# Patient Record
Sex: Female | Born: 1940 | Race: Black or African American | Hispanic: No | Marital: Married | Smoking: Never smoker
Health system: Southern US, Community
[De-identification: ages and names within clinical notes are randomized; demographics above are authoritative.]

## PROBLEM LIST (undated history)

## (undated) DIAGNOSIS — I10 Essential (primary) hypertension: Secondary | ICD-10-CM

## (undated) DIAGNOSIS — R413 Other amnesia: Secondary | ICD-10-CM

## (undated) HISTORY — PX: HERNIA REPAIR: SHX51

## (undated) HISTORY — PX: COLONOSCOPY: SHX174

---

## 2004-09-21 ENCOUNTER — Ambulatory Visit: Payer: Self-pay

## 2004-10-12 ENCOUNTER — Ambulatory Visit: Payer: Self-pay

## 2005-02-16 ENCOUNTER — Ambulatory Visit: Payer: Self-pay

## 2005-02-22 ENCOUNTER — Ambulatory Visit: Payer: Self-pay

## 2005-05-04 ENCOUNTER — Ambulatory Visit: Payer: Self-pay | Admitting: Obstetrics and Gynecology

## 2005-05-11 ENCOUNTER — Inpatient Hospital Stay: Payer: Self-pay | Admitting: Obstetrics and Gynecology

## 2005-10-17 ENCOUNTER — Ambulatory Visit: Payer: Self-pay | Admitting: General Surgery

## 2005-11-06 ENCOUNTER — Emergency Department: Payer: Self-pay | Admitting: Emergency Medicine

## 2006-05-01 ENCOUNTER — Ambulatory Visit: Payer: Self-pay | Admitting: Internal Medicine

## 2006-07-26 ENCOUNTER — Ambulatory Visit: Payer: Self-pay | Admitting: Family Medicine

## 2006-12-15 ENCOUNTER — Emergency Department: Payer: Self-pay | Admitting: Emergency Medicine

## 2007-07-11 ENCOUNTER — Ambulatory Visit: Payer: Self-pay | Admitting: Internal Medicine

## 2007-08-14 ENCOUNTER — Ambulatory Visit: Payer: Self-pay | Admitting: Internal Medicine

## 2007-11-04 ENCOUNTER — Ambulatory Visit: Payer: Self-pay | Admitting: Internal Medicine

## 2008-04-08 ENCOUNTER — Ambulatory Visit: Payer: Self-pay

## 2008-06-22 ENCOUNTER — Ambulatory Visit: Payer: Self-pay | Admitting: Internal Medicine

## 2008-11-02 ENCOUNTER — Ambulatory Visit: Payer: Self-pay | Admitting: Unknown Physician Specialty

## 2008-11-25 ENCOUNTER — Ambulatory Visit: Payer: Self-pay | Admitting: Internal Medicine

## 2008-12-23 ENCOUNTER — Ambulatory Visit: Payer: Self-pay | Admitting: Internal Medicine

## 2009-12-28 ENCOUNTER — Ambulatory Visit: Payer: Self-pay | Admitting: Obstetrics and Gynecology

## 2009-12-29 ENCOUNTER — Ambulatory Visit: Payer: Self-pay | Admitting: Internal Medicine

## 2009-12-31 ENCOUNTER — Emergency Department: Payer: Self-pay | Admitting: Emergency Medicine

## 2010-11-29 ENCOUNTER — Emergency Department: Payer: Self-pay | Admitting: Emergency Medicine

## 2011-01-19 ENCOUNTER — Emergency Department: Payer: Self-pay | Admitting: Unknown Physician Specialty

## 2011-02-10 IMAGING — CT CT ANGIOGRAPHY NECK
1 of 4 series · 12 of 33 positions shown · IV contrast (APPLIED)
Comparison: none

REASON FOR EXAM: lt sided carotid stenosis
COMMENTS:

PROCEDURE:     CT  - CT ANGIOGRAPHY NECK W/WO  - November 25, 2008  [DATE]
RESULT:     Comparisons: No comparison
INDICATION: Left carotid stenosis
TECHNIQUE: 100 ml of Isovue 370 were administered and the extracranial
carotid arteries bilaterally were scanned during the arterial phase from the
level of the superior portion of the frontal sinuses to the proximal neck.
These images were then transferred to the Siemens work station and were
subsequently reviewed utilizing 3-D reconstructions and MIP images.

[Series 4: soft tissue · axial · 0.39mm/px · z∈[+678,+896]mm · 12 of 87 slices shown]
[im 7/87  soft-tissue]
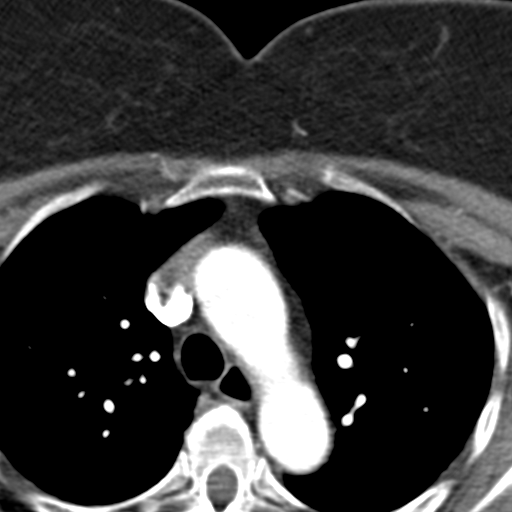
[im 14/87  bone]
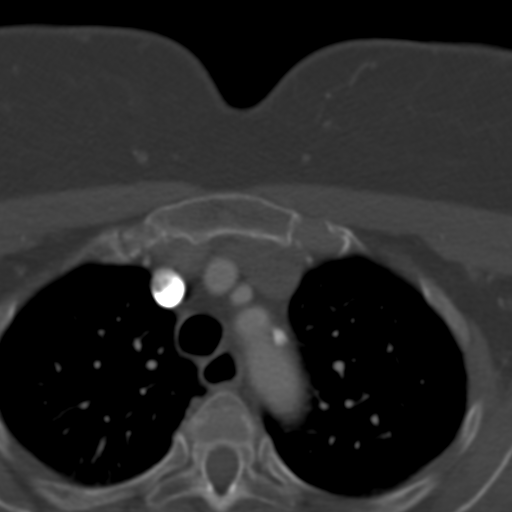
[im 20/87  soft-tissue]
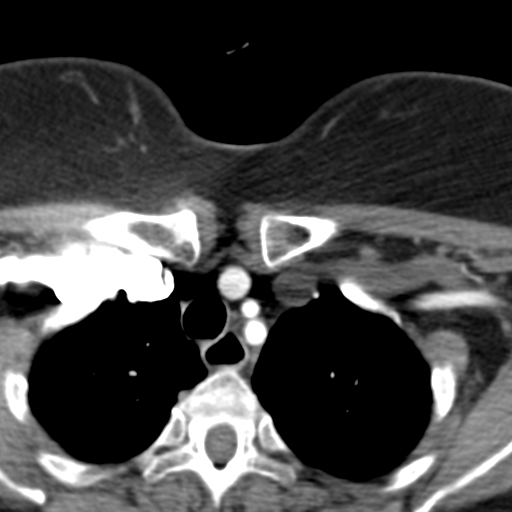
[im 27/87  bone]
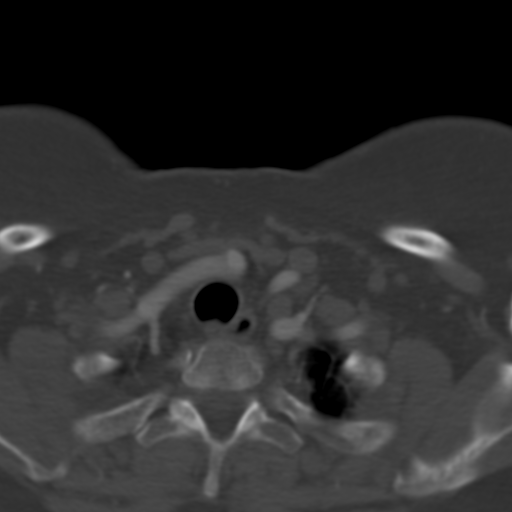
[im 34/87  soft-tissue]
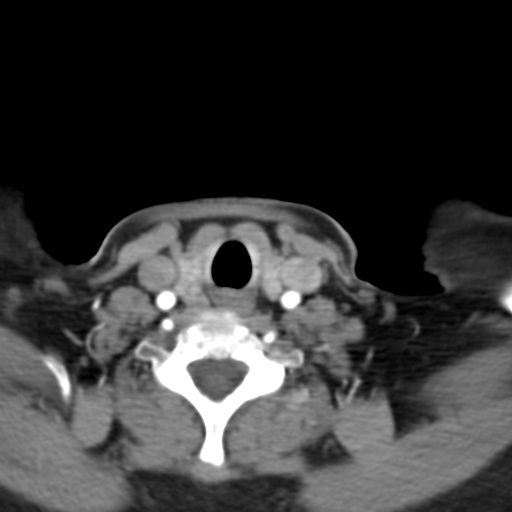
[im 40/87  bone]
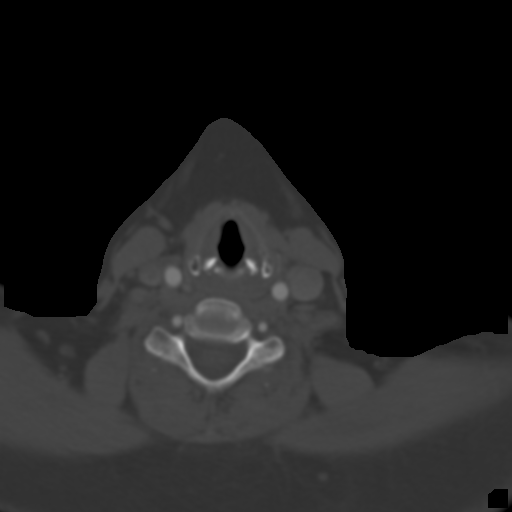
[im 47/87  soft-tissue]
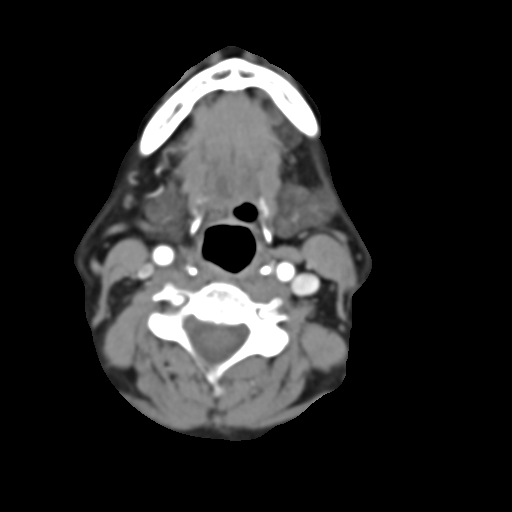
[im 53/87  bone]
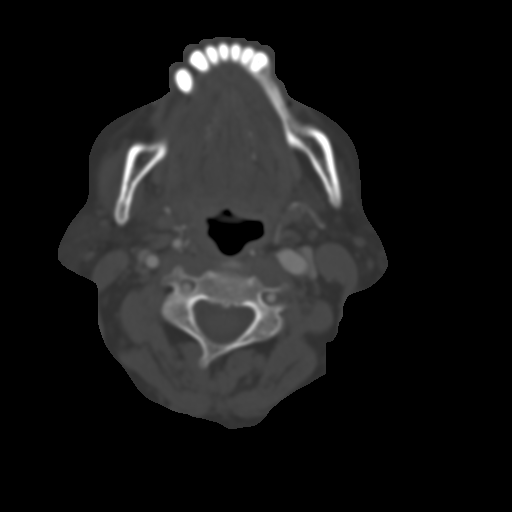
[im 60/87  soft-tissue]
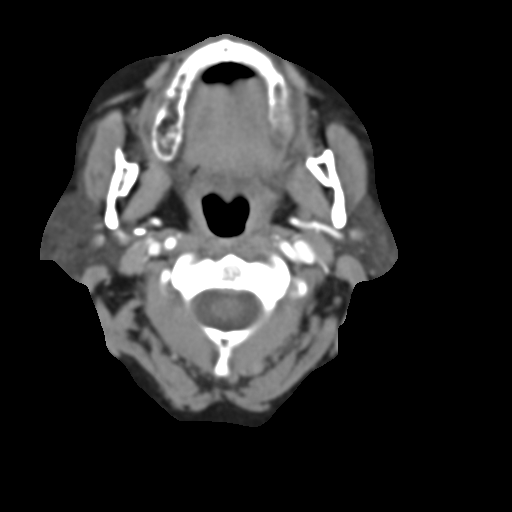
[im 67/87  bone]
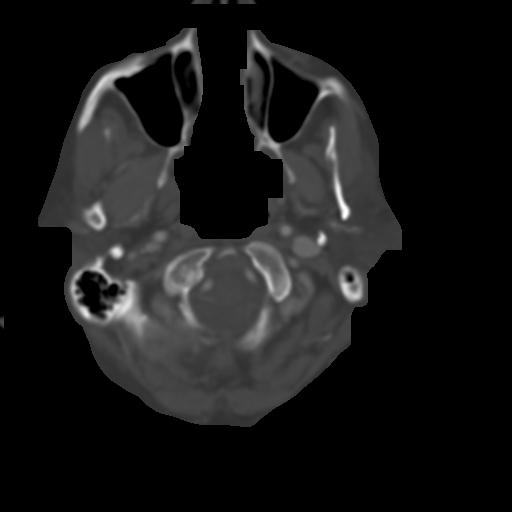
[im 73/87  soft-tissue]
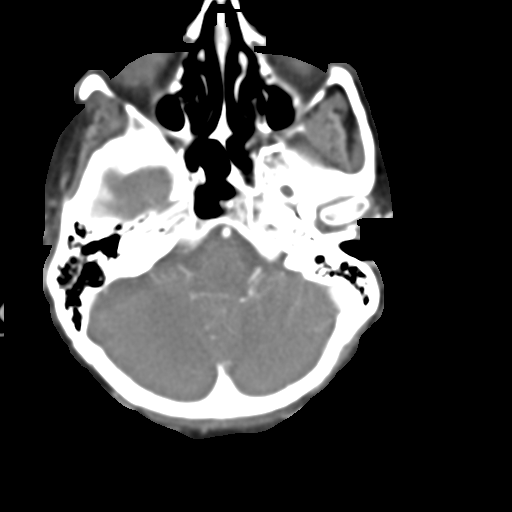
[im 80/87  bone]
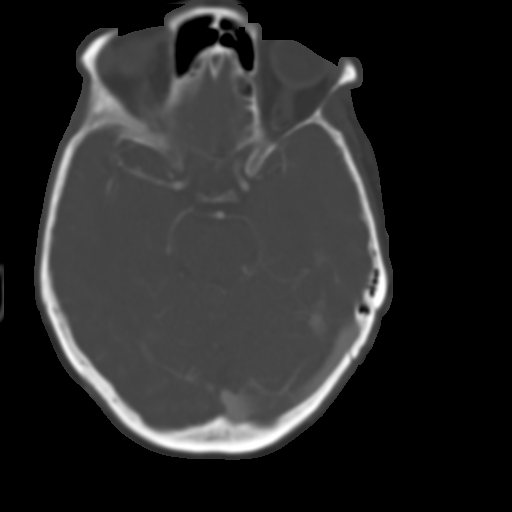

[12 of 33 positions shown; findings below may reference images not displayed]

FINDINGS: A three-vessel configuration of the aortic arch is identified with normal
ostium. The vertebral arteries are identified arising from the subclavian
arteries and entering the transverse foramen bilaterally at C6.

The carotid arteries are identified and are symmetric and unremarkable.
There is no evidence of aneurysm or carotid dissection bilaterally.

On the right, there is no significant atherosclerotic plaque involving the
right common carotid artery, right carotid bulb, or right internal carotid
artery. The right external carotid artery is patent.

On the left, there is no significant atherosclerotic plaque involving the
left common carotid artery, left carotid bulb, or left internal carotid
artery. The left external carotid artery is patent.

The visualized portions of the brain are unremarkable.
IMPRESSION: 1. No evidence of carotid artery stenosis.

## 2011-10-26 ENCOUNTER — Ambulatory Visit: Payer: Self-pay | Admitting: Internal Medicine

## 2012-11-04 ENCOUNTER — Ambulatory Visit: Payer: Self-pay | Admitting: Internal Medicine

## 2014-01-11 ENCOUNTER — Ambulatory Visit: Payer: Self-pay | Admitting: Gastroenterology

## 2014-01-13 ENCOUNTER — Ambulatory Visit: Payer: Self-pay | Admitting: Internal Medicine

## 2014-02-23 ENCOUNTER — Ambulatory Visit: Payer: Self-pay | Admitting: Urology

## 2016-01-13 ENCOUNTER — Emergency Department
Admission: EM | Admit: 2016-01-13 | Discharge: 2016-01-13 | Disposition: A | Discharge status: Home / Self Care | Payer: Medicare HMO | Attending: Student | Admitting: Student

## 2016-01-13 ENCOUNTER — Emergency Department: Payer: Medicare HMO

## 2016-01-13 DIAGNOSIS — R1011 Right upper quadrant pain: Secondary | ICD-10-CM | POA: Diagnosis not present

## 2016-01-13 DIAGNOSIS — R109 Unspecified abdominal pain: Secondary | ICD-10-CM

## 2016-01-13 DIAGNOSIS — M79602 Pain in left arm: Secondary | ICD-10-CM

## 2016-01-13 DIAGNOSIS — R10A1 Flank pain, right side: Secondary | ICD-10-CM

## 2016-01-13 DIAGNOSIS — N189 Chronic kidney disease, unspecified: Secondary | ICD-10-CM | POA: Diagnosis not present

## 2016-01-13 DIAGNOSIS — R52 Pain, unspecified: Secondary | ICD-10-CM

## 2016-01-13 DIAGNOSIS — I129 Hypertensive chronic kidney disease with stage 1 through stage 4 chronic kidney disease, or unspecified chronic kidney disease: Secondary | ICD-10-CM | POA: Insufficient documentation

## 2016-01-13 LAB — URINALYSIS COMPLETE WITH MICROSCOPIC (ARMC ONLY)
BACTERIA UA: NONE SEEN
Bilirubin Urine: NEGATIVE
GLUCOSE, UA: NEGATIVE mg/dL
Ketones, ur: NEGATIVE mg/dL
Leukocytes, UA: NEGATIVE
Nitrite: NEGATIVE
PROTEIN: NEGATIVE mg/dL
Specific Gravity, Urine: 1.011 (ref 1.005–1.030)
WBC, UA: NONE SEEN WBC/hpf (ref 0–5)
pH: 6 (ref 5.0–8.0)

## 2016-01-13 LAB — CBC WITH DIFFERENTIAL/PLATELET
BASOS ABS: 0 10*3/uL (ref 0–0.1)
BASOS PCT: 1 %
EOS ABS: 0.2 10*3/uL (ref 0–0.7)
EOS PCT: 3 %
HCT: 40.8 % (ref 35.0–47.0)
Hemoglobin: 13.6 g/dL (ref 12.0–16.0)
Lymphocytes Relative: 21 %
Lymphs Abs: 1.4 10*3/uL (ref 1.0–3.6)
MCH: 29.4 pg (ref 26.0–34.0)
MCHC: 33.5 g/dL (ref 32.0–36.0)
MCV: 87.7 fL (ref 80.0–100.0)
Monocytes Absolute: 0.4 10*3/uL (ref 0.2–0.9)
Monocytes Relative: 6 %
Neutro Abs: 5 10*3/uL (ref 1.4–6.5)
Neutrophils Relative %: 69 %
PLATELETS: 233 10*3/uL (ref 150–440)
RBC: 4.65 MIL/uL (ref 3.80–5.20)
RDW: 14.7 % — ABNORMAL HIGH (ref 11.5–14.5)
WBC: 7.1 10*3/uL (ref 3.6–11.0)

## 2016-01-13 LAB — COMPREHENSIVE METABOLIC PANEL
ALBUMIN: 4.1 g/dL (ref 3.5–5.0)
ALT: 78 U/L — AB (ref 14–54)
AST: 178 U/L — AB (ref 15–41)
Alkaline Phosphatase: 160 U/L — ABNORMAL HIGH (ref 38–126)
Anion gap: 11 (ref 5–15)
BUN: 13 mg/dL (ref 6–20)
CHLORIDE: 108 mmol/L (ref 101–111)
CO2: 23 mmol/L (ref 22–32)
CREATININE: 0.93 mg/dL (ref 0.44–1.00)
Calcium: 9 mg/dL (ref 8.9–10.3)
GFR calc non Af Amer: 58 mL/min — ABNORMAL LOW (ref >60)
Glucose, Bld: 129 mg/dL — ABNORMAL HIGH (ref 65–99)
Potassium: 3.9 mmol/L (ref 3.5–5.1)
SODIUM: 142 mmol/L (ref 135–145)
Total Bilirubin: 1.7 mg/dL — ABNORMAL HIGH (ref 0.3–1.2)
Total Protein: 6.9 g/dL (ref 6.5–8.1)

## 2016-01-13 LAB — TROPONIN I: Troponin I: <0.03 ng/mL (ref <0.03)

## 2016-01-13 MED ORDER — ACETAMINOPHEN 500 MG PO TABS
1000.0000 mg | ORAL_TABLET | Freq: Once | ORAL | Status: AC
  Administered 2016-01-13: 1000 mg via ORAL
  Filled 2016-01-13: qty 2

## 2016-01-13 MED ORDER — IBUPROFEN 400 MG PO TABS
400.0000 mg | ORAL_TABLET | Freq: Once | ORAL | Status: AC
  Administered 2016-01-13: 400 mg via ORAL
  Filled 2016-01-13: qty 1

## 2016-01-13 MED ORDER — IBUPROFEN 800 MG PO TABS: 400.0000 mg | ORAL_TABLET | Freq: Four times a day (QID) | ORAL | 0 refills | Status: DC | PRN

## 2016-01-13 NOTE — ED Provider Notes (Signed)
[facility] Emergency Department Provider Note   ____________________________________________   First MD Initiated Contact with Patient 01/13/16 1114     (approximate)  I have reviewed the triage vital signs and the nursing notes.   HISTORY  Chief Complaint Flank Pain Left arm/shoulder pain   HPI Angel Cameron is a 75 y.o. female with history of hypertension, hyponatremia, chronic kidney disease who presents primarily for evaluation of 3 weeks of intermittent left shoulder/arm pain, gradual onset, worse with movement, atraumatic, currently mild to moderate. Additionally, over the past 3 or 4 days she has had some mild right flank pain, no dysuria or hematuria, no abdominal pain, no vomiting or diarrhea, no chest pain or difficulty breathing. No fevers or chills. She denies chest pain or difficulty breathing. No cough or fever. She denies any numbness or weakness in the legs, no bowel or bladder incontinence.   No past medical history on file.  There are no active problems to display for this patient.   No past surgical history on file.  Prior to Admission medications   Medication Sig Start Date End Date Taking? Authorizing Provider  ibuprofen (ADVIL,MOTRIN) 800 MG tablet Take 0.5 tablets (400 mg total) by mouth every 6 (six) hours as needed for moderate pain. 01/13/16   Gayla Doss, MD    Allergies Review of patient's allergies indicates no known allergies.  No family history on file.  Social History Social History  Substance Use Topics  . Smoking status: Not on file  . Smokeless tobacco: Not on file  . Alcohol use Not on file    Review of Systems Constitutional: No fever/chills Eyes: No visual changes. ENT: No sore throat. Cardiovascular: Denies chest pain. Respiratory: Denies shortness of breath. Gastrointestinal: No abdominal pain.  No nausea, no vomiting.  No diarrhea.  No constipation. Genitourinary: Negative for  dysuria. Musculoskeletal:Positive for right flank pain. Skin: Negative for rash. Neurological: Negative for headaches, focal weakness or numbness.  10-point ROS otherwise negative.  ____________________________________________   PHYSICAL EXAM:  Vitals:   01/13/16 1330 01/13/16 1400 01/13/16 1441 01/13/16 1450  BP: 140/73 (!) 154/72 (!) 149/81   Pulse: 94 94 (!) 101 99  Resp: Temp:   98.3 F (36.8 C)   TempSrc:   Oral   SpO2: 97% 96% 96% 98%  Weight:      Height:        VITAL SIGNS: ED Triage Vitals  Enc Vitals Group     BP 01/13/16 0944 (!) 179/69     Pulse Rate 01/13/16 0944 87     Resp 01/13/16 0944 14     Temp 01/13/16 0944 98.2 F (36.8 C)     Temp Source 01/13/16 0944 Oral     SpO2 01/13/16 0944 95 %     Weight 01/13/16 0944 170 lb (77.1 kg)     Height 01/13/16 0944  (1.575 m)     Head Circumference --      Peak Flow --      Pain Score 01/13/16 0945 7     Pain Loc --      Pain Edu? --      Excl. in GC? --     Constitutional: Alert and oriented. Well appearing and in no acute distress. Eyes: Conjunctivae are normal. PERRL. EOMI. Head: Atraumatic. Nose: No congestion/rhinnorhea. Mouth/Throat: Mucous membranes are moist.  Oropharynx non-erythematous. Neck: No stridor.   Cardiovascular: Normal rate, regular rhythm. Grossly normal heart  sounds.  Good peripheral circulation. Respiratory: Normal respiratory effort.  No retractions. Lungs CTAB. Gastrointestinal: Soft and nontender. No distention. No CVA tenderness. Genitourinary: deferred Musculoskeletal: No lower extremity tenderness nor edema.  No joint effusions. Full range of motion at the left shoulder, no bony step-off or deformity. The patient does have some mild tenderness to palpation in the triceps and the biceps just adjacent to the left shoulder joint. Palpation seems to reproduce her pain. 2+ left radial pulse, left radial, median and ulnar nerve are intact. Neurologic:  Normal speech  and language. No gross focal neurologic deficits are appreciated. No gait instability. Skin:  Skin is warm, dry and intact. No rash noted. Psychiatric: Mood and affect are normal. Speech and behavior are normal.  ____________________________________________   LABS (all labs ordered are listed, but only abnormal results are displayed)  Labs Reviewed  URINALYSIS COMPLETEWITH MICROSCOPIC ([facility] ONLY) - Abnormal; Notable for the following:       Result Value   Color, Urine YELLOW (*)    APPearance CLEAR (*)    Hgb urine dipstick 1+ (*)    Squamous Epithelial / LPF 0-5 (*)    All other components within normal limits  CBC WITH DIFFERENTIAL/PLATELET - Abnormal; Notable for the following:    RDW 14.7 (*)    All other components within normal limits  COMPREHENSIVE METABOLIC PANEL - Abnormal; Notable for the following:    Glucose, Bld 129 (*)    AST 178 (*)    ALT 78 (*)    Alkaline Phosphatase 160 (*)    Total Bilirubin 1.7 (*)    GFR calc non Af Amer 58 (*)    All other components within normal limits  TROPONIN I   ____________________________________________  EKG  ED ECG REPORT I, Gayla Doss, the attending physician, personally viewed and interpreted this ECG.   Date: 01/13/2016  EKG Time: 09:48  Rate: 89  Rhythm: normal sinus rhythm  Axis: normal  Intervals:none  ST&T Change: No acute ST elevation or acute ST depression.  ____________________________________________  RADIOLOGY  CXR IMPRESSION: Aortic atherosclerosis. No edema or consolidation.  RUQ ultrasound IMPRESSION: No evidence of acute abnormality. Unremarkable gallbladder. 1.6 cm hyperechoic left hepatic region, likely focal fatty infiltration or possibly hemangioma.  [place] retroperitoneal IMPRESSION: No renal abnormalities. No abdominal aortic aneurysm. In the proximal aorta, the maximum transverse diameter is 2.5 x 2.8 cm. Ectatic abdominal aorta at risk for aneurysm development.  Recommend followup by ultrasound in 5 years. This recommendation follows ACR consensus guidelines: White Paper of the ACR Incidental Findings Committee II on Vascular Findings. J Am Coll Radiol 2013; 10:789-794. Study otherwise unremarkable. No renal abnormalities. No abdominal aortic aneurysm. In the proximal aorta, the maximum transverse diameter is 2.5 x 2.8 cm. Ectatic abdominal aorta at risk for aneurysm development. Recommend followup by ultrasound in 5 years. This recommendation follows ACR consensus guidelines: White Paper of the ACR Incidental Findings Committee II on Vascular Findings. J Am Coll Radiol 2013; 10:789-794. Study otherwise unremarkable. ____________________________________________   PROCEDURES  Procedure(s) performed: None  Procedures  Critical Care performed: No  ____________________________________________   INITIAL IMPRESSION / ASSESSMENT AND PLAN / ED COURSE  Pertinent labs & imaging results that were available during my care of the patient were reviewed by me and considered in my medical decision making (see chart for details).  Angel Cameron is a 75 y.o. female with history of hypertension, hyponatremia, chronic kidney disease who presents primarily for evaluation of 3 weeks of intermittent  left shoulder/arm pain, gradual onset, worse with movement, atraumatic. On exam, she is very well-appearing and in no acute distress. Her vital signs are stable and she is afebrile. She has no obvious deformity associated with the left arm, no swelling, she is neurovascularly intact and she has reproducible pain when I press on her proximal muscles and I suspect that this is muscular skeletal pain however given her age and risk factors we'll obtain screening cardiac labs, check a chest x-ray. Her right flank pain is mild and nonspecific, we'll obtain screening abdominal labs, urinalysis and likely retroperitoneal ultrasound to evaluate the aortic contours and to  evaluate for possible obstructing stone. We'll treat her pain and reassess for disposition.  ----------------------------------------- 3:05 PM on 01/13/2016 ----------------------------------------- Patient reports significant improvement of her symptoms. She is sitting up in bed and appears comfortable. I reviewed her labs. CBC generally unremarkable. CMP showed mild nonspecific LFTs elevation as well as T bili elevation. Troponin negative. Urinalysis is not consistent with infection. Chest x-ray shows no acute cardio Mary disease. Ultrasound of the retroperitoneum is generally unremarkable and right upper quadrant ultrasound shows normal gallbladder, no obstructing stone. Suspect musculoskeletal pain of the left arm/shoulder for which I have recommended Motrin. I discussed with her that the exact cause of her mild elevation of her LFTs is not clear to me and that she is to follow with her primary care doctor quickly for reevaluation. She voices understanding of this. We also discussed that she may need a repeat ultrasound of the aorta in 5 years. We discussed return precautions, need for close PCP follow-up and she is comfortable with the discharge plan. DC home.  Clinical Course     ____________________________________________   FINAL CLINICAL IMPRESSION(S) / ED DIAGNOSES  Final diagnoses:  Right flank pain  Arm pain, musculoskeletal, left  Pain      NEW MEDICATIONS STARTED DURING THIS VISIT:  Discharge Medication List as of 01/13/2016  3:04 PM    START taking these medications   Details  ibuprofen (ADVIL,MOTRIN) 800 MG tablet Take 0.5 tablets (400 mg total) by mouth every 6 (six) hours as needed for moderate pain., Starting Fri 01/13/2016, Print         Note:  This document was prepared using Dragon voice recognition software and may include unintentional dictation errors.    Gayla Doss, MD 01/13/16 [phone]

## 2016-01-13 NOTE — ED Notes (Signed)
Patient transported to X-ray 

## 2016-01-13 NOTE — Discharge Instructions (Signed)
You were seen in the emergency department for left arm pain which is likely related to a strain. You are also having some right flank pain, the cause of which is unclear. Your liver function tests were mildly elevated and the cause of this isn't exactly clear. You need to follow up with your primary care doctor in the next 2-3 days so that they can recheck these levels. Return immediately to the emergency department if you develop severe or worsening flank/side pain, any chest pain or difficulty breathing, any abdominal pain, vomiting, fevers, or chills. Your aorta today looks to be within normal limits however you will probably need a repeat ultrasound of the aorta in 5 years or sooner depending on what your primary care doctor recommends.

## 2016-01-13 NOTE — ED Triage Notes (Signed)
Pt states she started having rt side pain yesterday and cough and congestion, pt states pain is nagging and not going away, pt denies hx of pneumonia, kidney stone, and denies pain with urination

## 2017-09-09 ENCOUNTER — Emergency Department
Admission: EM | Admit: 2017-09-09 | Discharge: 2017-09-09 | Disposition: A | Discharge status: Home / Self Care | Payer: Medicare HMO | Attending: Emergency Medicine | Admitting: Emergency Medicine

## 2017-09-09 ENCOUNTER — Other Ambulatory Visit: Payer: Self-pay

## 2017-09-09 ENCOUNTER — Emergency Department: Payer: Medicare HMO

## 2017-09-09 ENCOUNTER — Encounter: Payer: Self-pay | Admitting: Emergency Medicine

## 2017-09-09 DIAGNOSIS — N189 Chronic kidney disease, unspecified: Secondary | ICD-10-CM | POA: Insufficient documentation

## 2017-09-09 DIAGNOSIS — R42 Dizziness and giddiness: Secondary | ICD-10-CM | POA: Insufficient documentation

## 2017-09-09 DIAGNOSIS — Z79899 Other long term (current) drug therapy: Secondary | ICD-10-CM | POA: Insufficient documentation

## 2017-09-09 DIAGNOSIS — I129 Hypertensive chronic kidney disease with stage 1 through stage 4 chronic kidney disease, or unspecified chronic kidney disease: Secondary | ICD-10-CM | POA: Diagnosis not present

## 2017-09-09 DIAGNOSIS — E860 Dehydration: Secondary | ICD-10-CM

## 2017-09-09 LAB — URINALYSIS, COMPLETE (UACMP) WITH MICROSCOPIC
BACTERIA UA: NONE SEEN
Bilirubin Urine: NEGATIVE
Glucose, UA: NEGATIVE mg/dL
Hgb urine dipstick: NEGATIVE
KETONES UR: NEGATIVE mg/dL
Leukocytes, UA: NEGATIVE
Nitrite: NEGATIVE
PROTEIN: NEGATIVE mg/dL
RBC / HPF: NONE SEEN RBC/hpf (ref 0–5)
Specific Gravity, Urine: 1.015 (ref 1.005–1.030)
pH: 5 (ref 5.0–8.0)

## 2017-09-09 LAB — BASIC METABOLIC PANEL
Anion gap: 8 (ref 5–15)
BUN: 37 mg/dL — AB (ref 6–20)
CHLORIDE: 105 mmol/L (ref 101–111)
CO2: 27 mmol/L (ref 22–32)
CREATININE: 1.29 mg/dL — AB (ref 0.44–1.00)
Calcium: 9 mg/dL (ref 8.9–10.3)
GFR calc Af Amer: 45 mL/min — ABNORMAL LOW (ref >60)
GFR calc non Af Amer: 39 mL/min — ABNORMAL LOW (ref >60)
GLUCOSE: 119 mg/dL — AB (ref 65–99)
POTASSIUM: 4.6 mmol/L (ref 3.5–5.1)
Sodium: 140 mmol/L (ref 135–145)

## 2017-09-09 LAB — CBC WITH DIFFERENTIAL/PLATELET
Basophils Absolute: 0 10*3/uL (ref 0–0.1)
Basophils Relative: 1 %
EOS PCT: 4 %
Eosinophils Absolute: 0.2 10*3/uL (ref 0–0.7)
HCT: 39.8 % (ref 35.0–47.0)
HEMOGLOBIN: 13.1 g/dL (ref 12.0–16.0)
LYMPHS ABS: 1.3 10*3/uL (ref 1.0–3.6)
LYMPHS PCT: 29 %
MCH: 28.9 pg (ref 26.0–34.0)
MCHC: 32.8 g/dL (ref 32.0–36.0)
MCV: 88.2 fL (ref 80.0–100.0)
MONOS PCT: 9 %
Monocytes Absolute: 0.4 10*3/uL (ref 0.2–0.9)
Neutro Abs: 2.5 10*3/uL (ref 1.4–6.5)
Neutrophils Relative %: 57 %
Platelets: 221 10*3/uL (ref 150–440)
RBC: 4.51 MIL/uL (ref 3.80–5.20)
RDW: 14 % (ref 11.5–14.5)
WBC: 4.4 10*3/uL (ref 3.6–11.0)

## 2017-09-09 LAB — TROPONIN I

## 2017-09-09 MED ORDER — MECLIZINE HCL 25 MG PO TABS
25.0000 mg | ORAL_TABLET | Freq: Once | ORAL | Status: AC
  Administered 2017-09-09: 25 mg via ORAL
  Filled 2017-09-09: qty 1

## 2017-09-09 MED ORDER — SODIUM CHLORIDE 0.9 % IV BOLUS
1000.0000 mL | Freq: Once | INTRAVENOUS | Status: AC
  Administered 2017-09-09: 1000 mL via INTRAVENOUS

## 2017-09-09 NOTE — ED Triage Notes (Addendum)
Pt to ed with c/o dizziness that started last night worse this am.   Pt denies chest pain, denies headache, denies weakness. Pt with good equal grips bilat.  No facial drooping noted, no slurred speech or difficulty speaking noted.

## 2017-09-09 NOTE — ED Notes (Signed)
Ambulates to BR in room without dizziness.

## 2017-09-09 NOTE — ED Provider Notes (Signed)
[facility] Emergency Department Provider Note  ____________________________________________  Time seen: Approximately 11:56 AM  I have reviewed the triage vital signs and the nursing notes.   HISTORY  Chief Complaint Dizziness   HPI Angel Cameron is a 77 y.o. female with a history of vertigo, CKD, HTN, HLD who presents for evaluation of dizziness.  Patient reports that started yesterday.  She reports that she feels lightheaded but also reports feeling off balance. This morning needed assistance to walk as she was very dizzy per daughter. Her symptoms are worse when she stands up, resolve when she sits down.  She reports similar symptoms in the past when she was diagnosed with vertigo.  She does not take any medications for it.  No personal or family history of stroke, no history of smoking.  No headache, facial droop, slurred speech, unilateral weakness or numbness.  No vomiting or diarrhea, no URI symptoms, no chest pain or shortness of breath, no fever, no dysuria or hematuria.   PMH HTN HLD Vertigo CKD  Prior to Admission medications   Medication Sig Start Date End Date Taking? Authorizing Provider  atorvastatin (LIPITOR) 10 MG tablet Take 10 mg by mouth daily.   Yes [provider]  benazepril (LOTENSIN) 40 MG tablet Take 40 mg by mouth 2 (two) times daily.   Yes [provider]  donepezil (ARICEPT) 5 MG tablet Take 5 mg by mouth at bedtime.   Yes [provider]  ibuprofen (ADVIL,MOTRIN) 800 MG tablet Take 0.5 tablets (400 mg total) by mouth every 6 (six) hours as needed for moderate pain. 01/13/16  Yes Gayla Doss, MD  levothyroxine (SYNTHROID, LEVOTHROID) 75 MCG tablet Take 75 mcg by mouth daily before breakfast.   Yes [provider]  memantine (NAMENDA) 5 MG tablet Take 5 mg by mouth daily.   Yes [provider]  minocycline (DYNACIN) 100 MG tablet Take 100 mg by mouth 2 (two) times daily. 09/02/17  09/12/17 Yes [provider]  omeprazole (PRILOSEC) 20 MG capsule Take 20 mg by mouth daily.   Yes [provider]    Allergies Patient has no known allergies.  FH Heart failure Father    Dementia Mother    Diabetes Mother    Hypertension Mother       Social History Social History   Tobacco Use  . Smoking status: Never Smoker  . Smokeless tobacco: Never Used  Substance Use Topics  . Alcohol use: Not Currently    Frequency: Never  . Drug use: Never    Review of Systems  Constitutional: Negative for fever. Eyes: Negative for visual changes. ENT: Negative for sore throat. Neck: No neck pain  Cardiovascular: Negative for chest pain. Respiratory: Negative for shortness of breath. Gastrointestinal: Negative for abdominal pain, vomiting or diarrhea. Genitourinary: Negative for dysuria. Musculoskeletal: Negative for back pain. Skin: Negative for rash. Neurological: Negative for headaches, weakness or numbness. + dizziness Psych: No SI or HI  ____________________________________________   PHYSICAL EXAM:  VITAL SIGNS: ED Triage Vitals [09/09/17 0837]  Enc Vitals Group     BP (!) 158/75     Pulse Rate 67     Resp 18     Temp 97.8 F (36.6 C)     Temp Source Oral     SpO2 98 %     Weight 170 lb (77.1 kg)     Height      Head Circumference      Peak Flow  Pain Score 0     Pain Loc      Pain Edu?      Excl. in GC?     Constitutional: Alert and oriented. Well appearing and in no apparent distress. HEENT:      Head: Normocephalic and atraumatic.         Eyes: Conjunctivae are normal. Sclera is non-icteric.       Mouth/Throat: Mucous membranes are moist.       Neck: Supple with no signs of meningismus. Cardiovascular: Regular rate and rhythm. No murmurs, gallops, or rubs. 2+ symmetrical distal pulses are present in all extremities. No JVD. Respiratory: Normal respiratory effort. Lungs are clear to auscultation bilaterally. No  wheezes, crackles, or rhonchi.  Gastrointestinal: Soft, non tender, and non distended with positive bowel sounds. No rebound or guarding. Genitourinary: No CVA tenderness. Musculoskeletal: Nontender with normal range of motion in all extremities. No edema, cyanosis, or erythema of extremities. Neurologic: Normal speech and language. A & O x3, PERRL, EOMI, no nystagmus, CN II-XII intact, motor testing reveals good tone and bulk throughout. There is no evidence of pronator drift or dysmetria. Muscle strength is 5/5 throughout. Deep tendon reflexes are 2+ throughout with downgoing toes. Sensory examination is intact. Gait deferred as patient felt extremely dizzy with standing. Skin: Skin is warm, dry and intact. No rash noted. Psychiatric: Mood and affect are normal. Speech and behavior are normal.  ____________________________________________   LABS (all labs ordered are listed, but only abnormal results are displayed)  Labs Reviewed  BASIC METABOLIC PANEL - Abnormal; Notable for the following components:      Result Value   Glucose, Bld 119 (*)    BUN 37 (*)    Creatinine, Ser 1.29 (*)    GFR calc non Af Amer 39 (*)    GFR calc Af Amer 45 (*)    All other components within normal limits  URINALYSIS, COMPLETE (UACMP) WITH MICROSCOPIC - Abnormal; Notable for the following components:   Color, Urine YELLOW (*)    APPearance CLEAR (*)    Squamous Epithelial / LPF 0-5 (*)    All other components within normal limits  CBC WITH DIFFERENTIAL/PLATELET  TROPONIN I   ____________________________________________  EKG  ED ECG REPORT I, Nita Sickle, the attending physician, personally viewed and interpreted this ECG.  Normal sinus rhythm, rate of 76, normal intervals, normal axis, no ST elevations or depressions. ________________________________  RADIOLOGY  I have personally reviewed the images performed during this visit and I agree with the Radiologist's read.   Interpretation  by Radiologist:  Ct Head Wo Contrast  Result Date: 09/09/2017 CLINICAL DATA:  Dizziness. EXAM: CT HEAD WITHOUT CONTRAST TECHNIQUE: Contiguous axial images were obtained from the base of the skull through the vertex without intravenous contrast. COMPARISON:  CT scan of December 31, 2009. FINDINGS: Brain: No evidence of acute infarction, hemorrhage, hydrocephalus, extra-axial collection or mass lesion/mass effect. Vascular: No hyperdense vessel or unexpected calcification. Skull: Normal. Negative for fracture or focal lesion. Sinuses/Orbits: No acute finding. Other: Fluid is noted in left mastoid air cells. IMPRESSION: No significant intracranial abnormality seen. Electronically Signed   By: Lupita Raider, M.D.   On: 09/09/2017 09:27     ____________________________________________   PROCEDURES  Procedure(s) performed: None Procedures Critical Care performed:  None ____________________________________________   INITIAL IMPRESSION / ASSESSMENT AND PLAN / ED COURSE  77 y.o. female with a history of vertigo, CKD, HTN, HLD who presents for evaluation of dizziness.  Patient found  to be severely orthostatic with dizziness and inability to walk with standing.  Orthostatic vital signs positive.  Labs showing mildly elevated creatinine consistent with dehydration.  Patient received 2 L of normal saline with resolution of her orthostatic vital signs and resolution of her dizziness.  She is tolerating p.o.  She remains neurologically intact.  Her gait is normal.  UA negative for UTI, CBC with no evidence of anemia.  Troponin and EKG with no evidence of ischemia.  Head CT is within normal limits.  Patient is going to be discharged home with increase oral hydration and follow-up with primary care doctor for repeat labs.  Discussed return precautions for signs of central vertigo, stroke, or severe dehydration.  Patient will be discharged to the care of her family members were at the bedside.      As part of my  medical decision making, I reviewed the following data within the electronic MEDICAL RECORD NUMBER Nursing notes reviewed and incorporated, Labs reviewed , EKG interpreted , Radiograph reviewed , Notes from prior ED visits and [place] Controlled Substance Database    Pertinent labs & imaging results that were available during my care of the patient were reviewed by me and considered in my medical decision making (see chart for details).    ____________________________________________   FINAL CLINICAL IMPRESSION(S) / ED DIAGNOSES  Final diagnoses:  Orthostatic dizziness  Dehydration      NEW MEDICATIONS STARTED DURING THIS VISIT:  ED Discharge Orders    None       Note:  This document was prepared using Dragon voice recognition software and may include unintentional dictation errors.    Don PerkingVeronese, [place], MD 09/09/17 [id number]

## 2019-01-10 ENCOUNTER — Other Ambulatory Visit: Payer: Self-pay

## 2019-01-10 ENCOUNTER — Emergency Department: Payer: Medicare HMO

## 2019-01-10 ENCOUNTER — Encounter: Payer: Self-pay | Admitting: Emergency Medicine

## 2019-01-10 ENCOUNTER — Emergency Department
Admission: EM | Admit: 2019-01-10 | Discharge: 2019-01-10 | Disposition: A | Discharge status: Home / Self Care | Payer: Medicare HMO | Attending: Student in an Organized Health Care Education/Training Program | Admitting: Student in an Organized Health Care Education/Training Program

## 2019-01-10 DIAGNOSIS — I129 Hypertensive chronic kidney disease with stage 1 through stage 4 chronic kidney disease, or unspecified chronic kidney disease: Secondary | ICD-10-CM | POA: Diagnosis not present

## 2019-01-10 DIAGNOSIS — Z79899 Other long term (current) drug therapy: Secondary | ICD-10-CM | POA: Diagnosis not present

## 2019-01-10 DIAGNOSIS — R42 Dizziness and giddiness: Secondary | ICD-10-CM | POA: Diagnosis not present

## 2019-01-10 DIAGNOSIS — N182 Chronic kidney disease, stage 2 (mild): Secondary | ICD-10-CM | POA: Diagnosis not present

## 2019-01-10 DIAGNOSIS — E860 Dehydration: Secondary | ICD-10-CM | POA: Diagnosis not present

## 2019-01-10 DIAGNOSIS — R809 Proteinuria, unspecified: Secondary | ICD-10-CM | POA: Insufficient documentation

## 2019-01-10 HISTORY — DX: Other amnesia: R41.3

## 2019-01-10 HISTORY — DX: Essential (primary) hypertension: I10

## 2019-01-10 LAB — CBC WITH DIFFERENTIAL/PLATELET
Abs Immature Granulocytes: 0.02 10*3/uL (ref 0.00–0.07)
Basophils Absolute: 0 10*3/uL (ref 0.0–0.1)
Basophils Relative: 1 %
Eosinophils Absolute: 0.2 10*3/uL (ref 0.0–0.5)
Eosinophils Relative: 3 %
HCT: 37.4 % (ref 36.0–46.0)
Hemoglobin: 12.2 g/dL (ref 12.0–15.0)
Immature Granulocytes: 0 %
Lymphocytes Relative: 27 %
Lymphs Abs: 1.5 10*3/uL (ref 0.7–4.0)
MCH: 28.8 pg (ref 26.0–34.0)
MCHC: 32.6 g/dL (ref 30.0–36.0)
MCV: 88.2 fL (ref 80.0–100.0)
Monocytes Absolute: 0.5 10*3/uL (ref 0.1–1.0)
Monocytes Relative: 9 %
Neutro Abs: 3.3 10*3/uL (ref 1.7–7.7)
Neutrophils Relative %: 60 %
Platelets: 196 10*3/uL (ref 150–400)
RBC: 4.24 MIL/uL (ref 3.87–5.11)
RDW: 13 % (ref 11.5–15.5)
WBC: 5.5 10*3/uL (ref 4.0–10.5)
nRBC: 0 % (ref 0.0–0.2)

## 2019-01-10 LAB — COMPREHENSIVE METABOLIC PANEL
ALT: 14 U/L (ref 0–44)
AST: 18 U/L (ref 15–41)
Albumin: 3.7 g/dL (ref 3.5–5.0)
Alkaline Phosphatase: 100 U/L (ref 38–126)
Anion gap: 5 (ref 5–15)
BUN: 15 mg/dL (ref 8–23)
CO2: 30 mmol/L (ref 22–32)
Calcium: 8.8 mg/dL — ABNORMAL LOW (ref 8.9–10.3)
Chloride: 105 mmol/L (ref 98–111)
Creatinine, Ser: 0.96 mg/dL (ref 0.44–1.00)
GFR calc Af Amer: >60 mL/min (ref >60)
GFR calc non Af Amer: 56 mL/min — ABNORMAL LOW (ref >60)
Glucose, Bld: 140 mg/dL — ABNORMAL HIGH (ref 70–99)
Potassium: 4 mmol/L (ref 3.5–5.1)
Sodium: 140 mmol/L (ref 135–145)
Total Bilirubin: 0.8 mg/dL (ref 0.3–1.2)
Total Protein: 6.4 g/dL — ABNORMAL LOW (ref 6.5–8.1)

## 2019-01-10 LAB — URINALYSIS, COMPLETE (UACMP) WITH MICROSCOPIC
Bacteria, UA: NONE SEEN
Bilirubin Urine: NEGATIVE
Glucose, UA: NEGATIVE mg/dL
Hgb urine dipstick: NEGATIVE
Ketones, ur: NEGATIVE mg/dL
Leukocytes,Ua: NEGATIVE
Nitrite: NEGATIVE
Protein, ur: NEGATIVE mg/dL
Specific Gravity, Urine: 1.015 (ref 1.005–1.030)
pH: 5 (ref 5.0–8.0)

## 2019-01-10 MED ORDER — SODIUM CHLORIDE 0.9 % IV BOLUS
500.0000 mL | Freq: Once | INTRAVENOUS | Status: AC
  Administered 2019-01-10: 10:00:00 500 mL via INTRAVENOUS

## 2019-01-10 MED ORDER — SODIUM CHLORIDE 0.9 % IV BOLUS
500.0000 mL | Freq: Once | INTRAVENOUS | Status: AC
  Administered 2019-01-10: 09:00:00 500 mL via INTRAVENOUS

## 2019-01-10 NOTE — ED Notes (Signed)
Pt up to use bathroom at this time. 

## 2019-01-10 NOTE — ED Notes (Signed)
Pt with daughter at bedside states that around 9 last night she started feeling lightheaded- pt states that this has happened before and that it was dehydration

## 2019-01-10 NOTE — ED Notes (Signed)
X-ray at bedside

## 2019-01-10 NOTE — ED Triage Notes (Addendum)
Pt arrived via POV with daughter, pt has hx of memory loss. Pt states she "got to feeling bad".  Pt reports feeling dizzy last night, pt has hx of CKD, goes to nephrology.  Daughter states she does not drink water like she is supposed to.

## 2019-01-10 NOTE — ED Provider Notes (Signed)
[facility] Emergency Department Provider Note    First MD Initiated Contact with Patient 01/10/19 [phone]     (approximate)  I have reviewed the triage vital signs and the nursing notes.   HISTORY  Chief Complaint Dizziness    HPI Latanya MaudlinJattie M Codispoti is a 78 y.o. female with a history of memory loss as well as CKD frequently becoming dehydrated presents the ER for evaluation of "dizziness and lightheadedness "since around 9:00 last night.  States that she just feels lightheaded particular when she stands up.  She denies any chest pain or pressure.  No nausea or vomiting.  No numbness or tingling.  This has happened several times in the past.  She presents with her daughter who states that she does not drink enough fluids and has had to be given IV fluids for similar symptoms.    Past Medical History:  Diagnosis Date  . Hypertension   . Memory loss    No family history on file. No past surgical history on file. Patient Active Problem List   Diagnosis Date Noted  . Chronic kidney disease (CKD) stage G2/A1, mildly decreased glomerular filtration rate (GFR) between 60-89 mL/min/1.73 square meter and albuminuria creatinine ratio less than 30 mg/g 01/10/2019  . Proteinuria 01/10/2019      Prior to Admission medications   Medication Sig Start Date End Date Taking? Authorizing Provider  atorvastatin (LIPITOR) 10 MG tablet Take 10 mg by mouth daily.    [provider]  benazepril (LOTENSIN) 40 MG tablet Take 40 mg by mouth 2 (two) times daily.    [provider]  donepezil (ARICEPT) 5 MG tablet Take 5 mg by mouth at bedtime.    [provider]  ibuprofen (ADVIL,MOTRIN) 800 MG tablet Take 0.5 tablets (400 mg total) by mouth every 6 (six) hours as needed for moderate pain. 01/13/16   Gayla DossGayle, Eryka A, MD  levothyroxine (SYNTHROID, LEVOTHROID) 75 MCG tablet Take 75 mcg by mouth daily before breakfast.    [provider]  memantine  (NAMENDA) 5 MG tablet Take 5 mg by mouth daily.    [provider]  omeprazole (PRILOSEC) 20 MG capsule Take 20 mg by mouth daily.    [provider]    Allergies Patient has no known allergies.    Social History Social History   Tobacco Use  . Smoking status: Never Smoker  . Smokeless tobacco: Never Used  Substance Use Topics  . Alcohol use: Not Currently    Frequency: Never  . Drug use: Never    Review of Systems Patient denies headaches, rhinorrhea, blurry vision, numbness, shortness of breath, chest pain, edema, cough, abdominal pain, nausea, vomiting, diarrhea, dysuria, fevers, rashes or hallucinations unless otherwise stated above in HPI. ____________________________________________   PHYSICAL EXAM:  VITAL SIGNS: Vitals:   01/10/19 0824  BP: (!) 162/82  Pulse: 71  Resp: 18  Temp: 98 F (36.7 C)  SpO2: 97%    Constitutional: Alert and oriented.  Eyes: Conjunctivae are normal.  Head: Atraumatic. Nose: No congestion/rhinnorhea. Mouth/Throat: Mucous membranes are moist.   Neck: No stridor. Painless ROM.  Cardiovascular: Normal rate, regular rhythm. Grossly normal heart sounds.  Good peripheral circulation. Respiratory: Normal respiratory effort.  No retractions. Lungs CTAB. Gastrointestinal: Soft and nontender. No distention. No abdominal bruits. No CVA tenderness. Genitourinary:  Musculoskeletal: No lower extremity tenderness nor edema.  No joint effusions. Neurologic:  Normal speech and language. No gross focal neurologic deficits are appreciated. No facial droop  Skin:  Skin is warm, dry and intact. No rash noted. Psychiatric: Mood and affect are normal. Speech and behavior are normal.  ____________________________________________   LABS (all labs ordered are listed, but only abnormal results are displayed)  No results found for this or any previous visit (from the past 24 hour(s)). ____________________________________________  EKG  My review and personal interpretation at Time: 8:37   Indication: dizziness  Rate: 80  Rhythm: sinus Axis: normal  Other: nonspecific st abn, no stemi ____________________________________________  RADIOLOGY  I personally reviewed all radiographic images ordered to evaluate for the above acute complaints and reviewed radiology reports and findings.  These findings were personally discussed with the patient.  Please see medical record for radiology report.  ____________________________________________   PROCEDURES  Procedure(s) performed:  Procedures    Critical Care performed: no ____________________________________________   INITIAL IMPRESSION / ASSESSMENT AND PLAN / ED COURSE  Pertinent labs & imaging results that were available during my care of the patient were reviewed by me and considered in my medical decision making (see chart for details).   DDX: dehydration, electrolyte abn, uti, dysrhythmia, vertigo, cva  CYANN VENTI is a 78 y.o. who presents to the ED with lightheadedness and dizziness as described above.  She is well-appearing with no focal neuro deficits.  States that she is had similar episodes related to dehydration.  Daughter with her states that she only drinks sodas and does not drink water frequently gets dehydrated.  Blood will be sent for above differential.  Does not seem clinically consistent with infectious process, acs, CVA or central nervous system pathology.  The patient will be placed on continuous pulse oximetry and telemetry for monitoring.  Laboratory evaluation will be sent to evaluate for the above complaints.     Clinical Course as of Jan 09 1042  Sat Jan 10, 2019  0939 Patient reassessed.  Feeling improved after IV fluids.  Blood work is reassuring.  No evidence of infectious process.  Repeat neuro exam is nonfocal.   [PR]    Clinical Course User Index [PR] Merlyn Lot, MD    The patient was evaluated in Emergency Department  today for the symptoms described in the history of present illness. He/she was evaluated in the context of the global COVID-19 pandemic, which necessitated consideration that the patient might be at risk for infection with the SARS-CoV-2 virus that causes COVID-19. Institutional protocols and algorithms that pertain to the evaluation of patients at risk for COVID-19 are in a state of rapid change based on information released by regulatory bodies including the CDC and federal and state organizations. These policies and algorithms were followed during the patient's care in the ED.  As part of my medical decision making, I reviewed the following data within the [place] notes reviewed and incorporated, Labs reviewed, notes from prior ED visits and [place] Controlled Substance Database   ____________________________________________   FINAL CLINICAL IMPRESSION(S) / ED DIAGNOSES  Final diagnoses:  Dehydration  Dizziness      NEW MEDICATIONS STARTED DURING THIS VISIT:  New Prescriptions   No medications on file     Note:  This document was prepared using Dragon voice recognition software and may include unintentional dictation errors.    Merlyn Lot, MD 01/10/19 1045

## 2019-08-06 ENCOUNTER — Ambulatory Visit: Payer: Medicare HMO

## 2019-08-10 ENCOUNTER — Ambulatory Visit: Payer: Medicare HMO | Attending: Family

## 2019-08-10 DIAGNOSIS — Z23 Encounter for immunization: Secondary | ICD-10-CM | POA: Insufficient documentation

## 2019-08-10 NOTE — Progress Notes (Signed)
   Covid-19 Vaccination Clinic  Name:  Angel Cameron    MRN: [id number] DOB: 10-Aug-1940  08/10/2019  [name] was observed post Covid-19 immunization for 15 minutes without incidence. She was provided with Vaccine Information Sheet and instruction to access the V-Safe system.   [name] was instructed to call 911 with any severe reactions post vaccine: Marland Kitchen Difficulty breathing  . Swelling of your face and throat  . A fast heartbeat  . A bad rash all over your body  . Dizziness and weakness    Immunizations Administered    Name Date Dose VIS Date Route   Moderna COVID-19 Vaccine 08/10/2019  3:49 PM 0.5 mL 05/19/2019 Intramuscular   Manufacturer: Moderna   Lot: [id number]   NDC: 16838-706-58

## 2019-09-02 ENCOUNTER — Other Ambulatory Visit: Payer: Self-pay

## 2019-09-02 ENCOUNTER — Emergency Department: Payer: Medicare HMO

## 2019-09-02 ENCOUNTER — Encounter: Payer: Self-pay | Admitting: Emergency Medicine

## 2019-09-02 ENCOUNTER — Emergency Department
Admission: EM | Admit: 2019-09-02 | Discharge: 2019-09-02 | Disposition: A | Discharge status: Home / Self Care | Payer: Medicare HMO | Attending: Emergency Medicine | Admitting: Emergency Medicine

## 2019-09-02 DIAGNOSIS — E860 Dehydration: Secondary | ICD-10-CM

## 2019-09-02 DIAGNOSIS — I129 Hypertensive chronic kidney disease with stage 1 through stage 4 chronic kidney disease, or unspecified chronic kidney disease: Secondary | ICD-10-CM | POA: Insufficient documentation

## 2019-09-02 DIAGNOSIS — S0990XA Unspecified injury of head, initial encounter: Secondary | ICD-10-CM | POA: Insufficient documentation

## 2019-09-02 DIAGNOSIS — N182 Chronic kidney disease, stage 2 (mild): Secondary | ICD-10-CM | POA: Diagnosis not present

## 2019-09-02 DIAGNOSIS — H81399 Other peripheral vertigo, unspecified ear: Secondary | ICD-10-CM | POA: Insufficient documentation

## 2019-09-02 DIAGNOSIS — W19XXXA Unspecified fall, initial encounter: Secondary | ICD-10-CM | POA: Insufficient documentation

## 2019-09-02 DIAGNOSIS — Y929 Unspecified place or not applicable: Secondary | ICD-10-CM | POA: Diagnosis not present

## 2019-09-02 DIAGNOSIS — R519 Headache, unspecified: Secondary | ICD-10-CM | POA: Diagnosis not present

## 2019-09-02 DIAGNOSIS — Y939 Activity, unspecified: Secondary | ICD-10-CM | POA: Insufficient documentation

## 2019-09-02 DIAGNOSIS — Z79899 Other long term (current) drug therapy: Secondary | ICD-10-CM | POA: Insufficient documentation

## 2019-09-02 DIAGNOSIS — Y999 Unspecified external cause status: Secondary | ICD-10-CM | POA: Insufficient documentation

## 2019-09-02 LAB — URINALYSIS, COMPLETE (UACMP) WITH MICROSCOPIC
Bacteria, UA: NONE SEEN
Bilirubin Urine: NEGATIVE
Glucose, UA: NEGATIVE mg/dL
Hgb urine dipstick: NEGATIVE
Ketones, ur: NEGATIVE mg/dL
Leukocytes,Ua: NEGATIVE
Nitrite: NEGATIVE
Protein, ur: NEGATIVE mg/dL
Specific Gravity, Urine: 1.004 — ABNORMAL LOW (ref 1.005–1.030)
pH: 7 (ref 5.0–8.0)

## 2019-09-02 LAB — BASIC METABOLIC PANEL
Anion gap: 9 (ref 5–15)
BUN: 15 mg/dL (ref 8–23)
CO2: 29 mmol/L (ref 22–32)
Calcium: 8.8 mg/dL — ABNORMAL LOW (ref 8.9–10.3)
Chloride: 101 mmol/L (ref 98–111)
Creatinine, Ser: 0.98 mg/dL (ref 0.44–1.00)
GFR calc Af Amer: >60 mL/min (ref >60)
GFR calc non Af Amer: 54 mL/min — ABNORMAL LOW (ref >60)
Glucose, Bld: 140 mg/dL — ABNORMAL HIGH (ref 70–99)
Potassium: 4.2 mmol/L (ref 3.5–5.1)
Sodium: 139 mmol/L (ref 135–145)

## 2019-09-02 LAB — CBC
HCT: 36.7 % (ref 36.0–46.0)
Hemoglobin: 11.8 g/dL — ABNORMAL LOW (ref 12.0–15.0)
MCH: 28.4 pg (ref 26.0–34.0)
MCHC: 32.2 g/dL (ref 30.0–36.0)
MCV: 88.4 fL (ref 80.0–100.0)
Platelets: 186 10*3/uL (ref 150–400)
RBC: 4.15 MIL/uL (ref 3.87–5.11)
RDW: 13.2 % (ref 11.5–15.5)
WBC: 5.4 10*3/uL (ref 4.0–10.5)
nRBC: 0 % (ref 0.0–0.2)

## 2019-09-02 MED ORDER — MECLIZINE HCL 25 MG PO TABS
25.0000 mg | ORAL_TABLET | Freq: Once | ORAL | Status: AC
  Administered 2019-09-02: 25 mg via ORAL
  Filled 2019-09-02: qty 1

## 2019-09-02 MED ORDER — MECLIZINE HCL 25 MG PO TABS: 12.5000 mg | ORAL_TABLET | Freq: Two times a day (BID) | ORAL | 0 refills | Status: AC | PRN

## 2019-09-02 MED ORDER — SODIUM CHLORIDE 0.9 % IV BOLUS
1000.0000 mL | Freq: Once | INTRAVENOUS | Status: AC
  Administered 2019-09-02: 1000 mL via INTRAVENOUS

## 2019-09-02 NOTE — ED Provider Notes (Signed)
[facility] Emergency Department Provider Note  ____________________________________________  Time seen: Approximately 7:33 PM  I have reviewed the triage vital signs and the nursing notes.   HISTORY  Chief Complaint Dizziness    HPI Angel Cameron is a 79 y.o. female with a history of hypertension, CKD, dementia who comes the ED complaining of dizziness since last night.  Feels like room spinning, worse with turning her head and laying down.  No alleviating factors.  No vomiting hearing changes tinnitus vision changes paresthesias weakness.  She did have a fall and hit the back of her head on the ground 2 weeks ago for which she did not seek medical care.  Denies changes in balance or coordination.  Symptoms today are intermittent.  Denies neck pain but has a mild bilateral frontal headache which is nonradiating.  States she has had similar symptoms in the past which have been related to dehydration.  She notes that she has been eating okay, but does not drink water, only soda for liquids.      Past Medical History:  Diagnosis Date  . Hypertension   . Memory loss      Patient Active Problem List   Diagnosis Date Noted  . Chronic kidney disease (CKD) stage G2/A1, mildly decreased glomerular filtration rate (GFR) between 60-89 mL/min/1.73 square meter and albuminuria creatinine ratio less than 30 mg/g 01/10/2019  . Proteinuria 01/10/2019     History reviewed. No pertinent surgical history.   Prior to Admission medications   Medication Sig Start Date End Date Taking? Authorizing Provider  atorvastatin (LIPITOR) 10 MG tablet Take 10 mg by mouth daily.   Yes [provider]  benazepril (LOTENSIN) 40 MG tablet Take 40 mg by mouth 2 (two) times daily.   Yes [provider]  donepezil (ARICEPT) 5 MG tablet Take 5 mg by mouth at bedtime.   Yes [provider]  levothyroxine (SYNTHROID, LEVOTHROID) 75 MCG tablet Take 75 mcg by  mouth daily before breakfast.   Yes [provider]  memantine (NAMENDA) 5 MG tablet Take 5 mg by mouth daily.   Yes [provider]  Multiple Vitamin (MULTIVITAMIN WITH MINERALS) TABS tablet Take 1 tablet by mouth daily.   Yes [provider]  omeprazole (PRILOSEC) 20 MG capsule Take 20 mg by mouth daily.   Yes [provider]  meclizine (ANTIVERT) 25 MG tablet Take 0.5 tablets (12.5 mg total) by mouth 2 (two) times daily as needed for dizziness. 09/02/19   Sharman Cheek, MD     Allergies Patient has no known allergies.   History reviewed. No pertinent family history.  Social History Social History   Tobacco Use  . Smoking status: Never Smoker  . Smokeless tobacco: Never Used  Substance Use Topics  . Alcohol use: Not Currently  . Drug use: Never    Review of Systems  Constitutional:   No fever or chills.  ENT:   No sore throat. No rhinorrhea. Cardiovascular:   No chest pain or syncope. Respiratory:   No dyspnea or cough. Gastrointestinal:   Negative for abdominal pain, vomiting and diarrhea.  Musculoskeletal:   Negative for focal pain or swelling All other systems reviewed and are negative except as documented above in ROS and HPI.  ____________________________________________   PHYSICAL EXAM:  VITAL SIGNS: ED Triage Vitals [09/02/19 1619]  Enc Vitals Group     BP (!) 187/113     Pulse Rate 86     Resp 18  Temp 98.6 F (37 C)     Temp Source Oral     SpO2 97 %     Weight 159 lb (72.1 kg)     Height 5\' 2"  (1.575 m)     Head Circumference      Peak Flow      Pain Score 0     Pain Loc      Pain Edu?      Excl. in [place]?     Vital signs reviewed, nursing assessments reviewed.   Constitutional:   Alert and oriented. Non-toxic appearance. Eyes:   Conjunctivae are normal. EOMI. PERRL.  No nystagmus ENT      Head:   Normocephalic and atraumatic.      Nose:   Wearing a mask.      Mouth/Throat:   Wearing a mask.       Neck:   No meningismus. Full ROM. Hematological/Lymphatic/Immunilogical:   No cervical lymphadenopathy. Cardiovascular:   RRR. Symmetric bilateral radial and DP pulses.  No murmurs. Cap refill less than 2 seconds. Respiratory:   Normal respiratory effort without tachypnea/retractions. Breath sounds are clear and equal bilaterally. No wheezes/rales/rhonchi. Gastrointestinal:   Soft and nontender. Non distended. There is no CVA tenderness.  No rebound, rigidity, or guarding. Genitourinary:   deferred Musculoskeletal:   Normal range of motion in all extremities. No joint effusions.  No lower extremity tenderness.  No edema. Neurologic:   Normal speech and language.  Cranial nerves III through XII intact Normal finger-to-nose, normal heel-to-shin.  No pronator drift Motor grossly intact. No acute focal neurologic deficits are appreciated.  Skin:    Skin is warm, dry and intact. No rash noted.  No petechiae, purpura, or bullae.  ____________________________________________    LABS (pertinent positives/negatives) (all labs ordered are listed, but only abnormal results are displayed) Labs Reviewed  BASIC METABOLIC PANEL - Abnormal; Notable for the following components:      Result Value   Glucose, Bld 140 (*)    Calcium 8.8 (*)    GFR calc non Af Amer 54 (*)    All other components within normal limits  CBC - Abnormal; Notable for the following components:   Hemoglobin 11.8 (*)    All other components within normal limits  URINALYSIS, COMPLETE (UACMP) WITH MICROSCOPIC - Abnormal; Notable for the following components:   Color, Urine STRAW (*)    APPearance CLEAR (*)    Specific Gravity, Urine 1.004 (*)    All other components within normal limits   ____________________________________________   EKG  Interpreted by me Sinus rhythm rate of 72, normal axis and intervals.  Normal QRS ST segments and T waves.  ____________________________________________    RADIOLOGY  CT HEAD WO  CONTRAST  Result Date: 09/02/2019 CLINICAL DATA:  79 year old female with head trauma. EXAM: CT HEAD WITHOUT CONTRAST TECHNIQUE: Contiguous axial images were obtained from the base of the skull through the vertex without intravenous contrast. COMPARISON:  Head CT dated 09/09/2017. FINDINGS: Brain: There is mild age-related atrophy and chronic microvascular ischemic changes. There is no acute intracranial hemorrhage. No mass effect or midline shift. No extra-axial fluid collection. Vascular: No hyperdense vessel or unexpected calcification. Skull: Normal. Negative for fracture or focal lesion. Sinuses/Orbits: The visualized paranasal sinuses and the right mastoid air cells are clear. Left mastoid effusions noted. Other: None IMPRESSION: 1. No acute intracranial pathology. 2. Mild age-related atrophy and chronic microvascular ischemic changes. Electronically Signed   By: Anner Crete M.D.   On: 09/02/2019  19:26    ____________________________________________   PROCEDURES Procedures  ____________________________________________  DIFFERENTIAL DIAGNOSIS   Vertigo, dehydration, CVA, subdural hematoma  CLINICAL IMPRESSION / ASSESSMENT AND PLAN / ED COURSE  Medications ordered in the ED: Medications  sodium chloride 0.9 % bolus 1,000 mL (1,000 mLs Intravenous New Bag/Given 09/02/19 1940)  meclizine (ANTIVERT) tablet 25 mg (25 mg Oral Given 09/02/19 1934)    Pertinent labs & imaging results that were available during my care of the patient were reviewed by me and considered in my medical decision making (see chart for details).  Angel Cameron was evaluated in Emergency Department on 09/02/2019 for the symptoms described in the history of present illness. She was evaluated in the context of the global COVID-19 pandemic, which necessitated consideration that the patient might be at risk for infection with the SARS-CoV-2 virus that causes COVID-19. Institutional protocols and algorithms that  pertain to the evaluation of patients at risk for COVID-19 are in a state of rapid change based on information released by regulatory bodies including the CDC and federal and state organizations. These policies and algorithms were followed during the patient's care in the ED.   Patient presents with dizziness, most likely dehydration or peripheral vertigo related.  However with age and history of minor head injury a few weeks ago, CT scan obtained to evaluate for subdural hematoma.  This was negative for acute abnormalities.  I will give IV fluids and meclizine for symptom relief.  If dizziness is persistent, would obtain MRI to evaluate for stroke.  If she is feeling better after fluids and meclizine, I think she can be discharged home to follow-up with primary care without further work-up.  Doubt meningitis encephalitis intracranial hypertension glaucoma CRAO or CRVO aneurysm venous sinus thrombosis temporal arteritis.  Clinical Course as of Sep 02 2127  Wed Sep 02, 2019  2128 Urinalysis normal.  Patient feels better, no dizziness, ambulating to toilet and voiding spontaneously.  Stable for discharge home.   [PS]    Clinical Course User Index [PS] Sharman Cheek, MD     ____________________________________________   FINAL CLINICAL IMPRESSION(S) / ED DIAGNOSES    Final diagnoses:  Peripheral vertigo, unspecified laterality  Dehydration     ED Discharge Orders         Ordered    meclizine (ANTIVERT) 25 MG tablet  2 times daily PRN     09/02/19 2129          Portions of this note were generated with dragon dictation software. Dictation errors may occur despite best attempts at proofreading.   Sharman Cheek, MD 09/02/19 2130

## 2019-09-02 NOTE — ED Triage Notes (Signed)
Daughter reports 2 episodes of dizziness today.  States she has had this to happen before and she is usually dehydrated

## 2019-09-22 ENCOUNTER — Ambulatory Visit: Payer: Medicare HMO | Attending: Family

## 2019-09-22 DIAGNOSIS — Z23 Encounter for immunization: Secondary | ICD-10-CM

## 2019-09-22 NOTE — Progress Notes (Signed)
   Covid-19 Vaccination Clinic  Name:  Angel Cameron    MRN: [id number] DOB: May 16, 1941  09/22/2019  [name] was observed post Covid-19 immunization for 15 minutes without incident. She was provided with Vaccine Information Sheet and instruction to access the V-Safe system.   [name] was instructed to call 911 with any severe reactions post vaccine: Marland Kitchen Difficulty breathing  . Swelling of face and throat  . A fast heartbeat  . A bad rash all over body  . Dizziness and weakness   Immunizations Administered    Name Date Dose VIS Date Route   Moderna COVID-19 Vaccine 09/22/2019  2:48 PM 0.5 mL 05/19/2019 Intramuscular   Manufacturer: Moderna   Lot: [id number]   NDC: 59923-414-43

## 2020-05-02 ENCOUNTER — Ambulatory Visit: Payer: Medicare HMO | Attending: Internal Medicine

## 2020-05-02 DIAGNOSIS — Z23 Encounter for immunization: Secondary | ICD-10-CM

## 2020-05-02 NOTE — Progress Notes (Signed)
   Covid-19 Vaccination Clinic  Name:  Angel Cameron    MRN: [id number] DOB: 09-20-1940  05/02/2020  [name] was observed post Covid-19 immunization for 15 minutes without incident. She was provided with Vaccine Information Sheet and instruction to access the V-Safe system.   [name] was instructed to call 911 with any severe reactions post vaccine: Marland Kitchen Difficulty breathing  . Swelling of face and throat  . A fast heartbeat  . A bad rash all over body  . Dizziness and weakness   Immunizations Administered    No immunizations on file.

## 2021-07-31 ENCOUNTER — Emergency Department
Admission: EM | Admit: 2021-07-31 | Discharge: 2021-07-31 | Disposition: A | Discharge status: Home / Self Care | Payer: Medicare HMO | Attending: Emergency Medicine | Admitting: Emergency Medicine

## 2021-07-31 ENCOUNTER — Emergency Department: Payer: Medicare HMO

## 2021-07-31 ENCOUNTER — Encounter: Payer: Self-pay | Admitting: Emergency Medicine

## 2021-07-31 DIAGNOSIS — S7002XA Contusion of left hip, initial encounter: Secondary | ICD-10-CM | POA: Diagnosis not present

## 2021-07-31 DIAGNOSIS — W010XXA Fall on same level from slipping, tripping and stumbling without subsequent striking against object, initial encounter: Secondary | ICD-10-CM | POA: Insufficient documentation

## 2021-07-31 DIAGNOSIS — N189 Chronic kidney disease, unspecified: Secondary | ICD-10-CM | POA: Insufficient documentation

## 2021-07-31 DIAGNOSIS — Y92009 Unspecified place in unspecified non-institutional (private) residence as the place of occurrence of the external cause: Secondary | ICD-10-CM | POA: Insufficient documentation

## 2021-07-31 DIAGNOSIS — W19XXXA Unspecified fall, initial encounter: Secondary | ICD-10-CM

## 2021-07-31 DIAGNOSIS — S7000XA Contusion of unspecified hip, initial encounter: Secondary | ICD-10-CM

## 2021-07-31 DIAGNOSIS — S79912A Unspecified injury of left hip, initial encounter: Secondary | ICD-10-CM | POA: Diagnosis present

## 2021-07-31 NOTE — ED Triage Notes (Signed)
Pt arrived via [facility] from home where she had a witnessed mechanical fall after tripping. Pt landed onto her left side and is c/o left hip/femur pain. No shortening or rotation noted. Pt denies LOC and hitting head. Pt denies anticoagulants. Pt A&O x4. Family in route to ED.

## 2021-07-31 NOTE — ED Notes (Signed)
Daughter in triage, able to answer questions. Pt has hx of dementia. Pt denies any pain at this time.

## 2021-07-31 NOTE — ED Provider Notes (Signed)
° °  [facility] Provider Note    Event Date/Time   First MD Initiated Contact with Patient 07/31/21 2101     (approximate)   History   Fall   HPI  Angel Cameron is a 81 y.o. female with a history of CKD who is brought to the ED for left hip pain after a trip and fall at home.  No head injury or loss of consciousness.  Denies any complaints except for some pain at the left hip.  No blood thinner use.  No bleeding.     Physical Exam   Triage Vital Signs: ED Triage Vitals  Enc Vitals Group     BP 07/31/21 1933 (!) 119/48     Pulse Rate 07/31/21 1933 83     Resp 07/31/21 1933 16     Temp 07/31/21 1933 98.8 F (37.1 C)     Temp Source 07/31/21 1933 Oral     SpO2 07/31/21 1933 95 %     Weight 07/31/21 1933 170 lb (77.1 kg)     Height 07/31/21 1933 5\' 4"  (1.626 m)     Head Circumference --      Peak Flow --      Pain Score 07/31/21 2107 0     Pain Loc --      Pain Edu? --      Excl. in [place]? --     Most recent vital signs: Vitals:   07/31/21 1933 07/31/21 2107  BP: (!) 119/48 (!) 149/97  Pulse: 83 78  Resp: 16 15  Temp: 98.8 F (37.1 C) 98.8 F (37.1 C)  SpO2: 95% 100%     General: Awake, no distress.  CV:  Good peripheral perfusion.  Resp:  Normal effort.  Abd:  No distention.  Soft and nontender Other:  No hip tenderness or long bone tenderness.  Intact range of motion.  No pain with hip flexion or rotation.   ED Results / Procedures / Treatments   Labs (all labs ordered are listed, but only abnormal results are displayed) Labs Reviewed - No data to display   EKG     RADIOLOGY X-ray left hip and left femur reviewed by me, negative for fracture.  Radiology report reviewed    PROCEDURES:  Critical Care performed: No  Procedures   MEDICATIONS ORDERED IN ED: Medications - No data to display   IMPRESSION / MDM / ASSESSMENT AND PLAN / ED COURSE  I reviewed the triage vital signs and the nursing notes.                               Differential diagnosis includes, but is not limited to,     Patient presents with left hip pain after a mechanical fall.  Exam is reassuring, no evidence of acute injury.  X-rays are unremarkable as well.  Vital signs unremarkable, pain is minimal, stable for discharge.     FINAL CLINICAL IMPRESSION(S) / ED DIAGNOSES   Final diagnoses:  Contusion of hip, unspecified laterality, initial encounter     Rx / DC Orders   ED Discharge Orders     None        Note:  This document was prepared using Dragon voice recognition software and may include unintentional dictation errors.   2108, MD 07/31/21 2205

## 2021-08-30 ENCOUNTER — Other Ambulatory Visit: Payer: Self-pay | Admitting: Internal Medicine

## 2021-08-30 DIAGNOSIS — R100 Acute abdomen: Secondary | ICD-10-CM

## 2021-09-04 ENCOUNTER — Other Ambulatory Visit: Payer: Self-pay

## 2021-09-04 ENCOUNTER — Ambulatory Visit
Admission: RE | Admit: 2021-09-04 | Discharge: 2021-09-04 | Disposition: A | Discharge status: Home / Self Care | Payer: Medicare HMO | Source: Ambulatory Visit | Attending: Internal Medicine | Admitting: Internal Medicine

## 2021-09-04 DIAGNOSIS — R100 Acute abdomen: Secondary | ICD-10-CM | POA: Diagnosis present

## 2021-11-08 ENCOUNTER — Other Ambulatory Visit: Payer: Self-pay | Admitting: Internal Medicine

## 2021-11-08 DIAGNOSIS — R109 Unspecified abdominal pain: Secondary | ICD-10-CM

## 2021-11-15 ENCOUNTER — Ambulatory Visit
Admission: RE | Admit: 2021-11-15 | Discharge: 2021-11-15 | Disposition: A | Discharge status: Home / Self Care | Payer: Medicare HMO | Source: Ambulatory Visit | Attending: Internal Medicine | Admitting: Internal Medicine

## 2021-11-15 DIAGNOSIS — R109 Unspecified abdominal pain: Secondary | ICD-10-CM | POA: Diagnosis present

## 2021-11-15 LAB — POCT I-STAT CREATININE: Creatinine, Ser: 1.3 mg/dL — ABNORMAL HIGH (ref 0.44–1.00)

## 2021-11-15 MED ORDER — IOHEXOL 300 MG/ML  SOLN
100.0000 mL | Freq: Once | INTRAMUSCULAR | Status: AC | PRN
  Administered 2021-11-15: 80 mL via INTRAVENOUS

## 2022-01-16 ENCOUNTER — Emergency Department: Payer: Medicare HMO

## 2022-01-16 ENCOUNTER — Encounter: Payer: Self-pay | Admitting: Emergency Medicine

## 2022-01-16 ENCOUNTER — Other Ambulatory Visit: Payer: Self-pay

## 2022-01-16 ENCOUNTER — Emergency Department
Admission: EM | Admit: 2022-01-16 | Discharge: 2022-01-16 | Disposition: A | Discharge status: Home / Self Care | Payer: Medicare HMO | Attending: Emergency Medicine | Admitting: Emergency Medicine

## 2022-01-16 DIAGNOSIS — S0083XA Contusion of other part of head, initial encounter: Secondary | ICD-10-CM | POA: Insufficient documentation

## 2022-01-16 DIAGNOSIS — F0390 Unspecified dementia without behavioral disturbance: Secondary | ICD-10-CM | POA: Diagnosis not present

## 2022-01-16 DIAGNOSIS — W1830XA Fall on same level, unspecified, initial encounter: Secondary | ICD-10-CM | POA: Insufficient documentation

## 2022-01-16 DIAGNOSIS — S0990XA Unspecified injury of head, initial encounter: Secondary | ICD-10-CM | POA: Diagnosis present

## 2022-01-16 DIAGNOSIS — S0081XA Abrasion of other part of head, initial encounter: Secondary | ICD-10-CM

## 2022-01-16 DIAGNOSIS — Y92009 Unspecified place in unspecified non-institutional (private) residence as the place of occurrence of the external cause: Secondary | ICD-10-CM | POA: Diagnosis not present

## 2022-01-16 NOTE — ED Triage Notes (Signed)
First RN Note: Pt to ED via [facility] with c/o unwitnessed, ground level fall from home. Per EMS c/o some dizziness prior to the fall, denies thinners, denies LOC. Per EMS pt has hx of dementia is at baseline at this time. Pt with noted hematoma to R forehead/facial area at this time.   CBG 103 VSS

## 2022-01-16 NOTE — Discharge Instructions (Signed)
Follow-up with your primary care provider if any continued problems or concerns.  Clean the area daily with mild soap and water and watch the abrasion for any signs of infection.  Continue with her regular medications.  You may apply a small ice pack to her face to reduce any swelling however most likely you will start seeing some bruises.

## 2022-01-16 NOTE — ED Provider Notes (Signed)
[facility] Provider Note    Event Date/Time   First MD Initiated Contact with Patient 01/16/22 [phone]     (approximate)   History   Fall   HPI  Angel Cameron is a 81 y.o. female   presents to the ED via family members with concerns of a fall that occurred during the night.  EMS reports that this was an unwitnessed fall however patient's family was present and assisted her back to bed and stated that she was at her baseline when they helped her back into the bed.  Patient has dementia.  She is noted to have a abrasion to the right side of her head.  Patient is not on any blood thinners and denies pain.      Physical Exam   Triage Vital Signs: ED Triage Vitals  Enc Vitals Group     BP 01/16/22 0658 137/76     Pulse Rate 01/16/22 0658 78     Resp 01/16/22 0658 18     Temp 01/16/22 0658 98.4 F (36.9 C)     Temp Source 01/16/22 0658 Oral     SpO2 01/16/22 0658 95 %     Weight 01/16/22 0657 167 lb 8.8 oz (76 kg)     Height 01/16/22 0657 5\' 4"  (1.626 m)     Head Circumference --      Peak Flow --      Pain Score --      Pain Loc --      Pain Edu? --      Excl. in [place]? --     Most recent vital signs: Vitals:   01/16/22 0658  BP: 137/76  Pulse: 78  Resp: 18  Temp: 98.4 F (36.9 C)  SpO2: 95%     General: Awake, no distress.  Alert and answers questions.  Patient is pleasant. CV:  Good peripheral perfusion.  Heart regular rate and rhythm. Resp:  Normal effort.  Lungs are clear bilaterally. Abd:  No distention.  Other:  There is a superficial abrasion noted to the right lateral forehead and right cheek area that is superficial and without active bleeding.  No foreign body present.  Patient denies tenderness to this area.  No other complaints.  Palpation of the cervical, thoracic and lumbar spine were negative for pain or grimacing.  Patient is able move upper and lower extremities without difficulty.  No skin abrasions are noted on other body  parts.   ED Results / Procedures / Treatments   Labs (all labs ordered are listed, but only abnormal results are displayed) Labs Reviewed - No data to display   RADIOLOGY  CT head per radiologist is negative for acute intracranial changes but does show marked atrophy consistent with her dementia.   PROCEDURES:  Critical Care performed:   Procedures   MEDICATIONS ORDERED IN ED: Medications - No data to display   IMPRESSION / MDM / ASSESSMENT AND PLAN / ED COURSE  I reviewed the triage vital signs and the nursing notes.   Differential diagnosis includes, but is not limited to, contusion forehead, head injury, abrasions, fall in the home.  81 year old female presents to the ED via EMS after a fall occurred during the night.  Family states that she was at her baseline when they helped her get back to bed and there has been no nausea, vomiting or change in behavior.  Patient has a history of dementia.  CT scan was reassuring and family was  made aware that this was negative.  They were given instructions on how to watch the abrasion to her forehead and cheek for any signs of infection and clean daily with mild soap and water.  Patient is very pleasant and cooperative during exam.  Family was also instructed to use ice if any soft tissue edema is concerning.  She is to follow-up with her PCP if any continued problems.      Patient's presentation is most consistent with acute complicated illness / injury requiring diagnostic workup.  FINAL CLINICAL IMPRESSION(S) / ED DIAGNOSES   Final diagnoses:  Contusion of face, initial encounter  Facial abrasion, initial encounter  Fall in home, initial encounter     Rx / DC Orders   ED Discharge Orders     None        Note:  This document was prepared using Dragon voice recognition software and may include unintentional dictation errors.   Tommi Rumps, PA-C 01/16/22 1115    Sharman Cheek, MD 01/17/22 Kristopher Oppenheim

## 2022-01-16 NOTE — ED Triage Notes (Signed)
Pt presents from home via EMS following an unwitnessed ground level fall at home. Per family, pt was at her baseline when they went to assist her back into bed - Pt has Dementia. Pt has a hematoma to the right side of her head - denies LOC - not on thinners - no pain.

## 2022-01-16 NOTE — ED Notes (Signed)
See triage note  Presents with family  Had unwitnessed fall this am    was found on floor by husband this am   Abrasion noted to right temporal area

## 2022-04-06 ENCOUNTER — Ambulatory Visit (LOCAL_COMMUNITY_HEALTH_CENTER): Payer: Self-pay

## 2022-04-06 DIAGNOSIS — Z111 Encounter for screening for respiratory tuberculosis: Secondary | ICD-10-CM

## 2022-04-09 ENCOUNTER — Ambulatory Visit (LOCAL_COMMUNITY_HEALTH_CENTER): Payer: Self-pay

## 2022-04-09 DIAGNOSIS — Z111 Encounter for screening for respiratory tuberculosis: Secondary | ICD-10-CM

## 2022-04-09 LAB — TB SKIN TEST
Induration: 0 mm
TB Skin Test: NEGATIVE

## 2022-06-27 ENCOUNTER — Encounter: Payer: Self-pay | Admitting: Internal Medicine

## 2022-06-27 ENCOUNTER — Other Ambulatory Visit: Payer: Self-pay

## 2022-06-27 ENCOUNTER — Emergency Department: Payer: Medicare HMO

## 2022-06-27 ENCOUNTER — Inpatient Hospital Stay
Admission: EM | Admit: 2022-06-27 | Discharge: 2022-07-04 | DRG: 521 | Disposition: A | Discharge status: Skilled Nursing Facility | Payer: Medicare HMO | Source: Skilled Nursing Facility | Attending: Internal Medicine | Admitting: Internal Medicine

## 2022-06-27 DIAGNOSIS — S72001A Fracture of unspecified part of neck of right femur, initial encounter for closed fracture: Secondary | ICD-10-CM | POA: Diagnosis not present

## 2022-06-27 DIAGNOSIS — Z79890 Hormone replacement therapy (postmenopausal): Secondary | ICD-10-CM | POA: Diagnosis not present

## 2022-06-27 DIAGNOSIS — Z79899 Other long term (current) drug therapy: Secondary | ICD-10-CM

## 2022-06-27 DIAGNOSIS — B9620 Unspecified Escherichia coli [E. coli] as the cause of diseases classified elsewhere: Secondary | ICD-10-CM | POA: Diagnosis not present

## 2022-06-27 DIAGNOSIS — E1122 Type 2 diabetes mellitus with diabetic chronic kidney disease: Secondary | ICD-10-CM | POA: Diagnosis present

## 2022-06-27 DIAGNOSIS — W1830XA Fall on same level, unspecified, initial encounter: Secondary | ICD-10-CM | POA: Diagnosis present

## 2022-06-27 DIAGNOSIS — E43 Unspecified severe protein-calorie malnutrition: Secondary | ICD-10-CM | POA: Diagnosis present

## 2022-06-27 DIAGNOSIS — R54 Age-related physical debility: Secondary | ICD-10-CM | POA: Diagnosis present

## 2022-06-27 DIAGNOSIS — F0390 Unspecified dementia without behavioral disturbance: Secondary | ICD-10-CM | POA: Diagnosis present

## 2022-06-27 DIAGNOSIS — E785 Hyperlipidemia, unspecified: Secondary | ICD-10-CM | POA: Diagnosis present

## 2022-06-27 DIAGNOSIS — Z1152 Encounter for screening for COVID-19: Secondary | ICD-10-CM | POA: Diagnosis not present

## 2022-06-27 DIAGNOSIS — E039 Hypothyroidism, unspecified: Secondary | ICD-10-CM | POA: Diagnosis present

## 2022-06-27 DIAGNOSIS — N182 Chronic kidney disease, stage 2 (mild): Secondary | ICD-10-CM | POA: Diagnosis present

## 2022-06-27 DIAGNOSIS — Z6824 Body mass index (BMI) 24.0-24.9, adult: Secondary | ICD-10-CM | POA: Diagnosis not present

## 2022-06-27 DIAGNOSIS — N390 Urinary tract infection, site not specified: Secondary | ICD-10-CM | POA: Diagnosis not present

## 2022-06-27 DIAGNOSIS — Z833 Family history of diabetes mellitus: Secondary | ICD-10-CM

## 2022-06-27 DIAGNOSIS — Z751 Person awaiting admission to adequate facility elsewhere: Secondary | ICD-10-CM | POA: Diagnosis not present

## 2022-06-27 DIAGNOSIS — J9811 Atelectasis: Secondary | ICD-10-CM | POA: Diagnosis not present

## 2022-06-27 DIAGNOSIS — R400 Somnolence: Secondary | ICD-10-CM | POA: Diagnosis present

## 2022-06-27 DIAGNOSIS — Z8249 Family history of ischemic heart disease and other diseases of the circulatory system: Secondary | ICD-10-CM | POA: Diagnosis not present

## 2022-06-27 DIAGNOSIS — K219 Gastro-esophageal reflux disease without esophagitis: Secondary | ICD-10-CM | POA: Diagnosis present

## 2022-06-27 DIAGNOSIS — I129 Hypertensive chronic kidney disease with stage 1 through stage 4 chronic kidney disease, or unspecified chronic kidney disease: Secondary | ICD-10-CM | POA: Diagnosis present

## 2022-06-27 DIAGNOSIS — I10 Essential (primary) hypertension: Secondary | ICD-10-CM | POA: Diagnosis present

## 2022-06-27 LAB — RESP PANEL BY RT-PCR (RSV, FLU A&B, COVID)  RVPGX2
Influenza A by PCR: NEGATIVE
Influenza B by PCR: NEGATIVE
Resp Syncytial Virus by PCR: NEGATIVE
SARS Coronavirus 2 by RT PCR: NEGATIVE

## 2022-06-27 LAB — CBC WITH DIFFERENTIAL/PLATELET
Abs Immature Granulocytes: 0.03 10*3/uL (ref 0.00–0.07)
Basophils Absolute: 0 10*3/uL (ref 0.0–0.1)
Basophils Relative: 1 %
Eosinophils Absolute: 0 10*3/uL (ref 0.0–0.5)
Eosinophils Relative: 1 %
HCT: 34.3 % — ABNORMAL LOW (ref 36.0–46.0)
Hemoglobin: 11 g/dL — ABNORMAL LOW (ref 12.0–15.0)
Immature Granulocytes: 1 %
Lymphocytes Relative: 13 %
Lymphs Abs: 0.7 10*3/uL (ref 0.7–4.0)
MCH: 27.8 pg (ref 26.0–34.0)
MCHC: 32.1 g/dL (ref 30.0–36.0)
MCV: 86.8 fL (ref 80.0–100.0)
Monocytes Absolute: 0.4 10*3/uL (ref 0.1–1.0)
Monocytes Relative: 7 %
Neutro Abs: 4.6 10*3/uL (ref 1.7–7.7)
Neutrophils Relative %: 77 %
Platelets: 155 10*3/uL (ref 150–400)
RBC: 3.95 MIL/uL (ref 3.87–5.11)
RDW: 13.9 % (ref 11.5–15.5)
WBC: 5.8 10*3/uL (ref 4.0–10.5)
nRBC: 0 % (ref 0.0–0.2)

## 2022-06-27 LAB — COMPREHENSIVE METABOLIC PANEL
ALT: 15 U/L (ref 0–44)
AST: 21 U/L (ref 15–41)
Albumin: 3.6 g/dL (ref 3.5–5.0)
Alkaline Phosphatase: 92 U/L (ref 38–126)
Anion gap: 11 (ref 5–15)
BUN: 24 mg/dL — ABNORMAL HIGH (ref 8–23)
CO2: 26 mmol/L (ref 22–32)
Calcium: 8.8 mg/dL — ABNORMAL LOW (ref 8.9–10.3)
Chloride: 99 mmol/L (ref 98–111)
Creatinine, Ser: 1.02 mg/dL — ABNORMAL HIGH (ref 0.44–1.00)
GFR, Estimated: 55 mL/min — ABNORMAL LOW (ref >60)
Glucose, Bld: 181 mg/dL — ABNORMAL HIGH (ref 70–99)
Potassium: 4.1 mmol/L (ref 3.5–5.1)
Sodium: 136 mmol/L (ref 135–145)
Total Bilirubin: 1.1 mg/dL (ref 0.3–1.2)
Total Protein: 6.3 g/dL — ABNORMAL LOW (ref 6.5–8.1)

## 2022-06-27 LAB — TYPE AND SCREEN
ABO/RH(D): AB POS
Antibody Screen: NEGATIVE

## 2022-06-27 MED ORDER — DONEPEZIL HCL 5 MG PO TABS
5.0000 mg | ORAL_TABLET | Freq: Every day | ORAL | Status: DC
  Administered 2022-06-27 – 2022-07-03 (×7): 5 mg via ORAL
  Filled 2022-06-27 (×7): qty 1

## 2022-06-27 MED ORDER — TRAZODONE HCL 50 MG PO TABS: 50.0000 mg | ORAL_TABLET | Freq: Every evening | ORAL | Status: DC | PRN

## 2022-06-27 MED ORDER — PANTOPRAZOLE SODIUM 40 MG PO TBEC
40.0000 mg | DELAYED_RELEASE_TABLET | Freq: Every day | ORAL | Status: DC
  Administered 2022-06-29 – 2022-07-04 (×6): 40 mg via ORAL
  Filled 2022-06-27 (×6): qty 1

## 2022-06-27 MED ORDER — ATORVASTATIN CALCIUM 10 MG PO TABS
10.0000 mg | ORAL_TABLET | Freq: Every day | ORAL | Status: DC
  Administered 2022-06-29 – 2022-07-04 (×6): 10 mg via ORAL
  Filled 2022-06-27 (×6): qty 1

## 2022-06-27 MED ORDER — ONDANSETRON HCL 4 MG/2ML IJ SOLN
4.0000 mg | Freq: Once | INTRAMUSCULAR | Status: AC
  Administered 2022-06-27: 4 mg via INTRAVENOUS
  Filled 2022-06-27: qty 2

## 2022-06-27 MED ORDER — MORPHINE SULFATE (PF) 2 MG/ML IV SOLN: 1.0000 mg | INTRAVENOUS | Status: DC | PRN

## 2022-06-27 MED ORDER — ADULT MULTIVITAMIN W/MINERALS CH
1.0000 | ORAL_TABLET | Freq: Every day | ORAL | Status: DC
  Administered 2022-06-29 – 2022-07-04 (×6): 1 via ORAL
  Filled 2022-06-27 (×6): qty 1

## 2022-06-27 MED ORDER — CEFAZOLIN SODIUM-DEXTROSE 2-4 GM/100ML-% IV SOLN
2.0000 g | INTRAVENOUS | Status: AC
  Administered 2022-06-28: 2 g via INTRAVENOUS

## 2022-06-27 MED ORDER — ACETAMINOPHEN 500 MG PO TABS
1000.0000 mg | ORAL_TABLET | Freq: Once | ORAL | Status: AC
  Administered 2022-06-27: 1000 mg via ORAL
  Filled 2022-06-27: qty 2

## 2022-06-27 MED ORDER — MORPHINE SULFATE (PF) 2 MG/ML IV SOLN
2.0000 mg | Freq: Once | INTRAVENOUS | Status: AC
  Administered 2022-06-27: 2 mg via INTRAVENOUS
  Filled 2022-06-27: qty 1

## 2022-06-27 MED ORDER — FLUOXETINE HCL 10 MG PO CAPS
10.0000 mg | ORAL_CAPSULE | Freq: Every day | ORAL | Status: DC
  Administered 2022-06-29 – 2022-07-04 (×6): 10 mg via ORAL
  Filled 2022-06-27 (×7): qty 1

## 2022-06-27 MED ORDER — MEMANTINE HCL 5 MG PO TABS
5.0000 mg | ORAL_TABLET | Freq: Every day | ORAL | Status: DC
  Administered 2022-06-29 – 2022-07-04 (×6): 5 mg via ORAL
  Filled 2022-06-27 (×6): qty 1

## 2022-06-27 MED ORDER — BENAZEPRIL HCL 20 MG PO TABS
40.0000 mg | ORAL_TABLET | Freq: Two times a day (BID) | ORAL | Status: DC
  Administered 2022-06-27 – 2022-07-04 (×13): 40 mg via ORAL
  Filled 2022-06-27 (×15): qty 2

## 2022-06-27 MED ORDER — LEVOTHYROXINE SODIUM 50 MCG PO TABS
75.0000 ug | ORAL_TABLET | Freq: Every day | ORAL | Status: DC
  Administered 2022-06-29 – 2022-07-04 (×5): 75 ug via ORAL
  Filled 2022-06-27 (×7): qty 1

## 2022-06-27 MED ORDER — HEPARIN SODIUM (PORCINE) 5000 UNIT/ML IJ SOLN
5000.0000 [IU] | Freq: Three times a day (TID) | INTRAMUSCULAR | Status: AC
  Administered 2022-06-27: 5000 [IU] via SUBCUTANEOUS
  Filled 2022-06-27: qty 1

## 2022-06-27 MED ORDER — HYDROCODONE-ACETAMINOPHEN 5-325 MG PO TABS: 1.0000 | ORAL_TABLET | Freq: Four times a day (QID) | ORAL | Status: DC | PRN

## 2022-06-27 MED ORDER — METOPROLOL SUCCINATE ER 50 MG PO TB24
50.0000 mg | ORAL_TABLET | Freq: Every day | ORAL | Status: DC
  Administered 2022-06-28 – 2022-07-04 (×7): 50 mg via ORAL
  Filled 2022-06-27 (×7): qty 1

## 2022-06-27 MED ORDER — SENNOSIDES-DOCUSATE SODIUM 8.6-50 MG PO TABS: 1.0000 | ORAL_TABLET | Freq: Every evening | ORAL | Status: DC | PRN

## 2022-06-27 MED ORDER — MECLIZINE HCL 25 MG PO TABS: 12.5000 mg | ORAL_TABLET | Freq: Two times a day (BID) | ORAL | Status: DC | PRN

## 2022-06-27 NOTE — Assessment & Plan Note (Signed)
-   Benazepril 20 mg p.o. twice daily metoprolol succinate 50 mg p.o. daily were resumed

## 2022-06-27 NOTE — ED Notes (Signed)
Pt placed on 2L [place] due to oxygen saturation dropping to 87% while sleeping. MD notified.

## 2022-06-27 NOTE — Assessment & Plan Note (Signed)
-   Atorvastatin 10 mg daily resumed 

## 2022-06-27 NOTE — ED Notes (Signed)
Pt to room now from imaging.

## 2022-06-27 NOTE — Assessment & Plan Note (Signed)
-   Resumed memantine 5 mg daily, donepezil 5 milligrams nightly

## 2022-06-27 NOTE — ED Provider Notes (Signed)
[facility] Provider Note    Event Date/Time   First MD Initiated Contact with Patient 06/27/22 1325     (approximate)   History   Fall, Hip Pain, and Knee Pain   HPI  Angel Cameron is a 82 y.o. female  with h/o dementia here with right hip pain. Pt reportedly had a witnessed fall at her facility last PM. She has since not wanted to put any weight on her R leg. She has not been more confused or otherwise ill. At mental baseline per facility.   Level 5 caveat invoked as remainder of history, ROS, and physical exam limited due to patient's dementia.        Physical Exam   Triage Vital Signs: ED Triage Vitals [06/27/22 1244]  Enc Vitals Group     BP (!) 141/85     Pulse Rate (!) 108     Resp 16     Temp 98 F (36.7 C)     Temp Source Oral     SpO2 91 %     Weight 134 lb (60.8 kg)     Height 5\' 2"  (1.575 m)     Head Circumference      Peak Flow      Pain Score      Pain Loc      Pain Edu?      Excl. in [place]?     Most recent vital signs: Vitals:   06/27/22 1630 06/27/22 1744  BP: (!) 113/50 (!) 131/90  Pulse: 61 (!) 56  Resp: 20   Temp:  98.3 F (36.8 C)  SpO2: 93% 100%     General: Awake, no distress.  CV:  Good peripheral perfusion. RRR. Resp:  Normal effort. Lungs clear. Abd:  No distention. No tenderness. Other:  RLE with shortening and external rotation, significant pain with any pROM. 2+ dp pulses.   ED Results / Procedures / Treatments   Labs (all labs ordered are listed, but only abnormal results are displayed) Labs Reviewed  CBC WITH DIFFERENTIAL/PLATELET - Abnormal; Notable for the following components:      Result Value   Hemoglobin 11.0 (*)    HCT 34.3 (*)    All other components within normal limits  COMPREHENSIVE METABOLIC PANEL - Abnormal; Notable for the following components:   Glucose, Bld 181 (*)    BUN 24 (*)    Creatinine, Ser 1.02 (*)    Calcium 8.8 (*)    Total Protein 6.3 (*)    GFR, Estimated  55 (*)    All other components within normal limits  RESP PANEL BY RT-PCR (RSV, FLU A&B, COVID)  [id number]  URINALYSIS, ROUTINE W REFLEX MICROSCOPIC  CBC  BASIC METABOLIC PANEL  TYPE AND SCREEN     EKG    RADIOLOGY DG Hip: R femoral neck fx DG Knee R: Negative CXR: Clear   I also independently reviewed and agree with radiologist interpretations.   PROCEDURES:  Critical Care performed: No   MEDICATIONS ORDERED IN ED: Medications  levothyroxine (SYNTHROID) tablet 75 mcg (has no administration in time range)  atorvastatin (LIPITOR) tablet 10 mg (has no administration in time range)  HYDROcodone-acetaminophen (NORCO/VICODIN) 5-325 MG per tablet 1 tablet (has no administration in time range)  morphine (PF) 2 MG/ML injection 1 mg (has no administration in time range)  senna-docusate (Senokot-S) tablet 1 tablet (has no administration in time range)  benazepril (LOTENSIN) tablet 40 mg (40 mg Oral Given 06/27/22 2117)  metoprolol succinate (TOPROL-XL) 24 hr tablet 50 mg (has no administration in time range)  donepezil (ARICEPT) tablet 5 mg (5 mg Oral Given 06/27/22 2117)  FLUoxetine (PROZAC) capsule 10 mg (has no administration in time range)  memantine (NAMENDA) tablet 5 mg (has no administration in time range)  traZODone (DESYREL) tablet 50 mg (has no administration in time range)  meclizine (ANTIVERT) tablet 12.5 mg (has no administration in time range)  pantoprazole (PROTONIX) EC tablet 40 mg (has no administration in time range)  multivitamin with minerals tablet 1 tablet (has no administration in time range)  ceFAZolin (ANCEF) IVPB 2g/100 mL premix (has no administration in time range)  acetaminophen (TYLENOL) tablet 1,000 mg (1,000 mg Oral Given 06/27/22 1548)  morphine (PF) 2 MG/ML injection 2 mg (2 mg Intravenous Given 06/27/22 1548)  ondansetron (ZOFRAN) injection 4 mg (4 mg Intravenous Given 06/27/22 1548)  heparin injection 5,000 Units (5,000 Units Subcutaneous Given  06/27/22 2119)     IMPRESSION / MDM / ASSESSMENT AND PLAN / ED COURSE  I reviewed the triage vital signs and the nursing notes.                              Differential diagnosis includes, but is not limited to, hip fx, contusion/sprain, fall 2/2 occult infection, generalized weakness, anemia, acs.  Patient's presentation is most consistent with acute presentation with potential threat to life or bodily function.  The patient is on the cardiac monitor to evaluate for evidence of arrhythmia and/or significant heart rate changes.  82 yo M with PMHx dementia, DM here with fall, R hip pain. Imaging shows R hip fx. No other injuries apparent on exam. Pt is at her mental baseline. Dr. Mack Guise of ortho consulted and will admit to hospitalist.     FINAL CLINICAL IMPRESSION(S) / ED DIAGNOSES   Final diagnoses:  Closed fracture of right hip, initial encounter [facility])     Rx / DC Orders   ED Discharge Orders     None        Note:  This document was prepared using Dragon voice recognition software and may include unintentional dictation errors.   Duffy Bruce, MD 06/27/22 2153

## 2022-06-27 NOTE — ED Notes (Signed)
Pt's family to bedside; briefly updated that pt is away at imaging and of plan once pt gets back to room.

## 2022-06-27 NOTE — ED Notes (Signed)
Boubacar, RN received report. Pt to be transported to room. Stable at time of departure

## 2022-06-27 NOTE — Assessment & Plan Note (Signed)
-   Levothyroxine 75 mcg daily resumed 

## 2022-06-27 NOTE — H&P (Signed)
History and Physical   Angel Cameron [id number] DOB: 1941-01-31 DOA: 06/27/2022  PCP: Casilda Carls, MD  Outpatient Specialists: Dr. Marry Guan, orthopedic service Patient coming from: [facility] via EMS  I have personally briefly reviewed patient's old medical records in [place].  Chief Concern: mechanical fall  HPI: Angel Cameron is a 82 year old female with dementia, hypertension, hyperlipidemia, hypothyroid, GERD, who presents emergency department for chief concerns of mechanical fall and right hip pain.  Initial vitals in the ED showed temperature of 98, respiration rate of 16, heart rate of 108, blood pressure 141/85, SpO2 of 91% on room air.  Patient was noted to be sleeping and that her oxygen saturations dropped to the 80s and patient was placed on 2 L nasal cannula with appropriate function saturation improvement.  Serum sodium is 136, potassium 4.1, chloride 99, bicarb 26, BUN of 24, serum creatinine of 1.02, EGFR is 55, nonfasting blood glucose 181, WBC 5.8, hemoglobin 11, platelets of 155.  CT of the head without contrast and CT cervical spine without contrast: Was read as  Right hip x-ray 2-3 views: Displaced, noncommitted varus angulation right femoral neck fracture  ED treatment: Acetaminophen 1000 mg p.o. one-time dose, morphine 2 mg IV one-time dose, ondansetron 4 mg IV.  EDP consulted orthopedic service, Dr. Mack Guise who states he will see the patient. ------------------------------ At bedside, she is sleeping and does not appear to be in acute distress.  She is breathing on her own and able to maintain her airway appropriate.  She murmurs with deep sternal rub.  Daugther denies reported nausea, fever, vomtiing, diarrhea. Daughter reports patient was formelry a size 20 and when down to size 12 after ozempic was started.   Social history: She lives at [facility]. She does not have history of tobacco use, etoh, and recreational drug use.    ROS: Unable to complete as patient has dementia  ED Course: Discussed with emergency medicine provider, patient requiring hospitalization for chief concerns of right hip fracture.  Assessment/Plan  Principal Problem:   Closed right hip fracture (HCC) Active Problems:   Chronic kidney disease (CKD) stage G2/A1, mildly decreased glomerular filtration rate (GFR) between 60-89 mL/min/1.73 square meter and albuminuria creatinine ratio less than 30 mg/g   Essential hypertension   Dementia without behavioral disturbance (HCC)   Hyperlipidemia   Hypothyroidism   Assessment and Plan:  * Closed right hip fracture [facility]) - Fall precautions - Symptomatic support: Norco 5-325 mg p.o. every 6 hours as needed for moderate pain, 3 doses ordered; morphine 1 mg IV every 4 hours as needed for severe pain, 4 doses ordered - AM team to reevaluate patient at bedside for continued opioid pain medications requirement - Keep n.p.o. after midnight except for sips of meds in anticipation of orthopedic evaluation and intervention  Hypothyroidism - Levothyroxine 75 mcg daily resumed  Hyperlipidemia - Atorvastatin 10 mg daily resumed  Dementia without behavioral disturbance (HCC) - Resumed memantine 5 mg daily, donepezil 5 milligrams nightly  Essential hypertension - Benazepril 20 mg p.o. twice daily metoprolol succinate 50 mg p.o. daily were resumed  Chronic kidney disease (CKD) stage G2/A1, mildly decreased glomerular filtration rate (GFR) between 60-89 mL/min/1.73 square meter and albuminuria creatinine ratio less than 30 mg/g - Appears to be at baseline  Chart reviewed.   DVT prophylaxis: Heparin 5000 units subcutaneous one-time dose ordered; AM team to reinitiate pharmacologic DVT prophylaxis when the benefits outweigh the risk Code Status: full code Diet: Heart healthy; n.p.o. after  midnight Family Communication: Monia Pouch, daughter who is HPOA is at bedside  Disposition Plan: Pending  clinical course Consults called: Orthopedic service Admission status: MedSurg, inpatient  Past Medical History:  Diagnosis Date   Hypertension    Memory loss    Past Surgical History:  Procedure Laterality Date   COLONOSCOPY     Social History:  reports that she has never smoked. She has never used smokeless tobacco. She reports that she does not currently use alcohol. She reports that she does not use drugs.  No Known Allergies Family History  Problem Relation Age of Onset   Diabetes Mother    Dementia Mother    Heart failure Father    Family history: Family history reviewed and not pertinent.  Prior to Admission medications   Medication Sig Start Date End Date Taking? Authorizing Provider  atorvastatin (LIPITOR) 10 MG tablet Take 10 mg by mouth daily.   Yes [provider]  benazepril (LOTENSIN) 40 MG tablet Take 40 mg by mouth 2 (two) times daily.   Yes [provider]  donepezil (ARICEPT) 5 MG tablet Take 5 mg by mouth at bedtime.   Yes [provider]  FLUoxetine (PROZAC) 10 MG capsule Take 10 mg by mouth daily. 05/24/22  Yes [provider]  levothyroxine (SYNTHROID, LEVOTHROID) 75 MCG tablet Take 75 mcg by mouth daily before breakfast.   Yes [provider]  memantine (NAMENDA) 5 MG tablet Take 5 mg by mouth daily.   Yes [provider]  metoprolol succinate (TOPROL-XL) 50 MG 24 hr tablet Take 50 mg by mouth daily. 05/24/22  Yes [provider]  Multiple Vitamin (MULTIVITAMIN WITH MINERALS) TABS tablet Take 1 tablet by mouth daily.   Yes [provider]  omeprazole (PRILOSEC) 20 MG capsule Take 20 mg by mouth daily.   Yes [provider]  traZODone (DESYREL) 50 MG tablet Take 50 mg by mouth at bedtime. 05/24/22  Yes [provider]  meclizine (ANTIVERT) 25 MG tablet Take 0.5 tablets (12.5 mg total) by mouth 2 (two) times daily as needed for dizziness. 09/02/19   Carrie Mew, MD    Physical Exam: Vitals:   06/27/22 1500 06/27/22 1509 06/27/22 1530 06/27/22 1630  BP: (!) 149/68  (!) 147/69 (!) 113/50  Pulse: (!) 59  (!) 56 61  Resp: 15  18 20   Temp:      TempSrc:      SpO2: (!) 87% 93% 99% 93%  Weight:      Height:       Constitutional: appears age appropriate and frail, NAD, calm, comfortable Eyes: PERRL, lids and conjunctivae normal ENMT: Mucous membranes are moist. Posterior pharynx clear of any exudate or lesions. Age-appropriate dentition.  Unable to assess Neck: normal, supple, no masses, no thyromegaly Respiratory: clear to auscultation bilaterally, no wheezing, no crackles. Normal respiratory effort. No accessory muscle use.  Cardiovascular: Regular rate and rhythm, no murmurs / rubs / gallops. No extremity edema. 2+ pedal pulses. No carotid bruits.  Abdomen: no tenderness, no masses palpated, no hepatosplenomegaly. Bowel sounds positive.  Musculoskeletal: no clubbing / cyanosis. No joint deformity upper and lower extremities. Good ROM, no contractures, no atrophy. Normal muscle tone.  Skin: no rashes, lesions, ulcers. No induration Neurologic: Sensation intact. Strength 5/5 in all 4.  Psychiatric: Unable to assess judgment, insight, alertness, mood.   EKG: independently reviewed, showing sinus rhythm with rate of 59, QTc 408  Chest x-ray on Admission: I personally reviewed and I agree  with radiologist reading as below.  CT HEAD WO CONTRAST ( )  Result Date: 06/27/2022 CLINICAL DATA:  Trauma, fall EXAM: CT HEAD WITHOUT CONTRAST TECHNIQUE: Contiguous axial images were obtained from the base of the skull through the vertex without intravenous contrast. RADIATION DOSE REDUCTION: This exam was performed according to the departmental dose-optimization program which includes automated exposure control, adjustment of the mA and/or kV according to patient size and/or use of iterative reconstruction technique. COMPARISON:  01/16/2022 FINDINGS: Brain: No acute  intracranial findings are seen. There are no signs of bleeding within the cranium. Calcifications are seen in basal ganglia. Cortical sulci are prominent. There is decreased density in periventricular white matter. Vascular: Unremarkable. Skull: No fracture is seen in calvarium. Sinuses/Orbits: Unremarkable. Other: None. IMPRESSION: No acute intracranial findings are seen. Atrophy. Small-vessel disease. Electronically Signed   By: Ernie Avena M.D.   On: 06/27/2022 16:18   CT Cervical Spine Wo Contrast  Result Date: 06/27/2022 CLINICAL DATA:  Provided history: Neck trauma. Additional history provided: Fall. EXAM: CT CERVICAL SPINE WITHOUT CONTRAST TECHNIQUE: Multidetector CT imaging of the cervical spine was performed without intravenous contrast. Multiplanar CT image reconstructions were also generated. RADIATION DOSE REDUCTION: This exam was performed according to the departmental dose-optimization program which includes automated exposure control, adjustment of the mA and/or kV according to patient size and/or use of iterative reconstruction technique. COMPARISON:  None. FINDINGS: Mildly motion degraded exam. Alignment: No significant spondylolisthesis. Skull base and vertebrae: The basion-dental and atlanto-dental intervals are maintained.No evidence of acute fracture to the cervical spine. Soft tissues and spinal canal: No prevertebral fluid or swelling. No visible canal hematoma. Disc levels: Cervical spondylosis with multilevel disc space narrowing and disc bulges/central disc protrusions. No appreciable high-grade spinal canal stenosis. No significant bony neural foraminal narrowing. Upper chest: No consolidation within the imaged lung apices. No visible pneumothorax. Other: Left mastoid effusion.  Aortic atherosclerosis. IMPRESSION: 1. Mildly motion degraded exam. 2. No evidence of acute fracture to the cervical spine. 3. Cervical spondylosis, as described. 4.  Aortic Atherosclerosis  (ICD10-I70.0). Electronically Signed   By: Jackey Loge D.O.   On: 06/27/2022 14:19   DG Chest 1 View  Result Date: 06/27/2022 CLINICAL DATA:  Preop chest. Patient fell last evening. Right hip fracture. EXAM: CHEST  1 VIEW COMPARISON:  01/10/2019. FINDINGS: Cardiac silhouette is normal in size. No mediastinal or hilar masses. Clear lungs.  No pleural effusion or pneumothorax. Skeletal structures are grossly intact. IMPRESSION: No active disease. Electronically Signed   By: Amie Portland M.D.   On: 06/27/2022 13:25   DG Knee Complete 4 Views Left  Result Date: 06/27/2022 CLINICAL DATA:  Fall.  Left knee pain. EXAM: LEFT KNEE - COMPLETE 4+ VIEW COMPARISON:  None Available. FINDINGS: No fracture.  No bone lesion. Mild medial joint space compartment narrowing. Associated marginal osteophytes. Remaining joint space compartments well preserved. Small superior and inferior patellar marginal spurs. No joint effusion. Surrounding soft tissues are unremarkable. IMPRESSION: 1. No fracture or acute finding. 2. 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 Electronically Signed   By: Amie Portland M.D.   On: 06/27/2022 13:24   DG Hip Unilat W or Wo Pelvis 2-3 Views Right  Result Date: 06/27/2022 CLINICAL DATA:  Larey Seat last night.  Right hip pain. EXAM: DG HIP (WITH OR WITHOUT PELVIS) 2-3V RIGHT COMPARISON:  None Available. FINDINGS: Right femoral neck fracture. Fractures non comminuted, extending across the mid femoral neck, distal fracture component displaced superiorly by 1.5 cm. There is significant  varus angulation. No other fractures.  No bone lesions. Hip joints, SI joints and pubic symphysis are normally aligned. Skeletal structures are demineralized. IMPRESSION: 1. Displaced, non comminuted, varus angulated right femoral neck fracture. Electronically Signed   By: Lajean Manes M.D.   On: 06/27/2022 13:23    Labs on Admission: I have personally reviewed following labs  CBC: Recent Labs   Lab 06/27/22 1438  WBC 5.8  NEUTROABS 4.6  HGB 11.0*  HCT 34.3*  MCV 86.8  PLT 169   Basic Metabolic Panel: Recent Labs  Lab 06/27/22 1438  NA 136  K 4.1  CL 99  CO2 26  GLUCOSE 181*  BUN 24*  CREATININE 1.02*  CALCIUM 8.8*   GFR: Estimated Creatinine Clearance: 35.9 mL/min (A) (by C-G formula based on SCr of 1.02 mg/dL (H)).  Liver Function Tests: Recent Labs  Lab 06/27/22 1438  AST 21  ALT 15  ALKPHOS 92  BILITOT 1.1  PROT 6.3*  ALBUMIN 3.6   Urine analysis:    Component Value Date/Time   COLORURINE STRAW (A) 09/02/2019 2036   APPEARANCEUR CLEAR (A) 09/02/2019 2036   LABSPEC 1.004 (L) 09/02/2019 2036   PHURINE 7.0 09/02/2019 2036   GLUCOSEU NEGATIVE 09/02/2019 2036   HGBUR NEGATIVE 09/02/2019 2036   BILIRUBINUR NEGATIVE 09/02/2019 2036   KETONESUR NEGATIVE 09/02/2019 2036   PROTEINUR NEGATIVE 09/02/2019 2036   NITRITE NEGATIVE 09/02/2019 2036   LEUKOCYTESUR NEGATIVE 09/02/2019 2036   This document was prepared using Dragon Voice Recognition software and may include unintentional dictation errors.  Dr. Tobie Poet [facility]  If 7PM-7AM, please contact overnight-coverage provider If 7AM-7PM, please contact day coverage provider [email or web address]  06/27/2022, 5:04 PM

## 2022-06-27 NOTE — ED Provider Triage Note (Signed)
Emergency Medicine Provider Triage Evaluation Note  Angel Cameron , a 82 y.o. female  was evaluated in triage.  Pt complains of right hip pain. Had a witnessed fall last pm at facility and has not ambulated since it happened.  Patient has dementia and daughter is with her.   Review of Systems  Positive: Right hip pain Negative:   Physical Exam  There were no vitals taken for this visit. Gen:   Awake, no distress  Non verbal during exam Resp:  Normal effort  MSK:   Guards against movement of the left knee and keeps at an extension.  Right hip tenderness.  Unable to identify any shortening or rotation as patient is in a wheelchair. Other:    Medical Decision Making  Medically screening exam initiated at 12:43 PM.  Appropriate orders placed.  Angel Cameron was informed that the remainder of the evaluation will be completed by another provider, this initial triage assessment does not replace that evaluation, and the importance of remaining in the ED until their evaluation is complete.     Johnn Hai, PA-C 06/27/22 1249

## 2022-06-27 NOTE — Consult Note (Signed)
ORTHOPAEDIC CONSULTATION  REQUESTING PHYSICIAN: Cox, Amy N, DO  Chief Complaint: Right femoral neck hip fracture   HPI: Angel Cameron is a 82 y.o. female with advanced dementia who sustained a fall overnight.  Patient was brought to the [facility] emergency department where x-rays revealed a displaced right femoral neck hip fracture.  Patient was admitted to the hospital service for preoperative clearance.  Orthopedics is consulted for management of the fracture.  I saw the patient in her room this evening with her family members at the bedside.  Past Medical History:  Diagnosis Date   Hypertension    Memory loss    Past Surgical History:  Procedure Laterality Date   COLONOSCOPY     Social History   Socioeconomic History   Marital status: Married    Spouse name: Not on file   Number of children: Not on file   Years of education: Not on file   Highest education level: Not on file  Occupational History   Not on file  Tobacco Use   Smoking status: Never   Smokeless tobacco: Never  Substance and Sexual Activity   Alcohol use: Not Currently   Drug use: Never   Sexual activity: Not Currently  Other Topics Concern   Not on file  Social History Narrative   Not on file   Social Determinants of Health   Financial Resource Strain: Not on file  Food Insecurity: Not on file  Transportation Needs: Not on file  Physical Activity: Not on file  Stress: Not on file  Social Connections: Not on file   Family History  Problem Relation Age of Onset   Diabetes Mother    Dementia Mother    Heart failure Father    No Known Allergies Prior to Admission medications   Medication Sig Start Date End Date Taking? Authorizing Provider  atorvastatin (LIPITOR) 10 MG tablet Take 10 mg by mouth daily.   Yes [provider]  benazepril (LOTENSIN) 40 MG tablet Take 40 mg by mouth 2 (two) times daily.   Yes [provider]  donepezil (ARICEPT) 5 MG tablet Take 5 mg  by mouth at bedtime.   Yes [provider]  FLUoxetine (PROZAC) 10 MG capsule Take 10 mg by mouth daily. 05/24/22  Yes [provider]  levothyroxine (SYNTHROID, LEVOTHROID) 75 MCG tablet Take 75 mcg by mouth daily before breakfast.   Yes [provider]  memantine (NAMENDA) 5 MG tablet Take 5 mg by mouth daily.   Yes [provider]  metoprolol succinate (TOPROL-XL) 50 MG 24 hr tablet Take 50 mg by mouth daily. 05/24/22  Yes [provider]  Multiple Vitamin (MULTIVITAMIN WITH MINERALS) TABS tablet Take 1 tablet by mouth daily.   Yes [provider]  omeprazole (PRILOSEC) 20 MG capsule Take 20 mg by mouth daily.   Yes [provider]  traZODone (DESYREL) 50 MG tablet Take 50 mg by mouth at bedtime. 05/24/22  Yes [provider]  meclizine (ANTIVERT) 25 MG tablet Take 0.5 tablets (12.5 mg total) by mouth 2 (two) times daily as needed for dizziness. 09/02/19   Sharman Cheek, MD   CT HEAD WO CONTRAST ( )  Result Date: 06/27/2022 CLINICAL DATA:  Trauma, fall EXAM: CT HEAD WITHOUT CONTRAST TECHNIQUE: Contiguous axial images were obtained from the base of the skull through the vertex without intravenous contrast. RADIATION DOSE REDUCTION: This exam was performed according to the departmental dose-optimization program which includes automated exposure control, adjustment of the  mA and/or kV according to patient size and/or use of iterative reconstruction technique. COMPARISON:  01/16/2022 FINDINGS: Brain: No acute intracranial findings are seen. There are no signs of bleeding within the cranium. Calcifications are seen in basal ganglia. Cortical sulci are prominent. There is decreased density in periventricular white matter. Vascular: Unremarkable. Skull: No fracture is seen in calvarium. Sinuses/Orbits: Unremarkable. Other: None. IMPRESSION: No acute intracranial findings are seen. Atrophy. Small-vessel disease. Electronically Signed    By: Elmer Picker M.D.   On: 06/27/2022 16:18   CT Cervical Spine Wo Contrast  Result Date: 06/27/2022 CLINICAL DATA:  Provided history: Neck trauma. Additional history provided: Fall. EXAM: CT CERVICAL SPINE WITHOUT CONTRAST TECHNIQUE: Multidetector CT imaging of the cervical spine was performed without intravenous contrast. Multiplanar CT image reconstructions were also generated. RADIATION DOSE REDUCTION: This exam was performed according to the departmental dose-optimization program which includes automated exposure control, adjustment of the mA and/or kV according to patient size and/or use of iterative reconstruction technique. COMPARISON:  None. FINDINGS: Mildly motion degraded exam. Alignment: No significant spondylolisthesis. Skull base and vertebrae: The basion-dental and atlanto-dental intervals are maintained.No evidence of acute fracture to the cervical spine. Soft tissues and spinal canal: No prevertebral fluid or swelling. No visible canal hematoma. Disc levels: Cervical spondylosis with multilevel disc space narrowing and disc bulges/central disc protrusions. No appreciable high-grade spinal canal stenosis. No significant bony neural foraminal narrowing. Upper chest: No consolidation within the imaged lung apices. No visible pneumothorax. Other: Left mastoid effusion.  Aortic atherosclerosis. IMPRESSION: 1. Mildly motion degraded exam. 2. No evidence of acute fracture to the cervical spine. 3. Cervical spondylosis, as described. 4.  Aortic Atherosclerosis (ICD10-I70.0). Electronically Signed   By: Kellie Simmering D.O.   On: 06/27/2022 14:19   DG Chest 1 View  Result Date: 06/27/2022 CLINICAL DATA:  Preop chest. Patient fell last evening. Right hip fracture. EXAM: CHEST  1 VIEW COMPARISON:  01/10/2019. FINDINGS: Cardiac silhouette is normal in size. No mediastinal or hilar masses. Clear lungs.  No pleural effusion or pneumothorax. Skeletal structures are grossly intact. IMPRESSION: No  active disease. Electronically Signed   By: Lajean Manes M.D.   On: 06/27/2022 13:25   DG Knee Complete 4 Views Left  Result Date: 06/27/2022 CLINICAL DATA:  Fall.  Left knee pain. EXAM: LEFT KNEE - COMPLETE 4+ VIEW COMPARISON:  None Available. FINDINGS: No fracture.  No bone lesion. Mild medial joint space compartment narrowing. Associated marginal osteophytes. Remaining joint space compartments well preserved. Small superior and inferior patellar marginal spurs. No joint effusion. Surrounding soft tissues are unremarkable. IMPRESSION: 1. No fracture or acute finding. 2. 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 Electronically Signed   By: Lajean Manes M.D.   On: 06/27/2022 13:24   DG Hip Unilat W or Wo Pelvis 2-3 Views Right  Result Date: 06/27/2022 CLINICAL DATA:  [place] last night.  Right hip pain. EXAM: DG HIP (WITH OR WITHOUT PELVIS) 2-3V RIGHT COMPARISON:  None Available. FINDINGS: Right femoral neck fracture. Fractures non comminuted, extending across the mid femoral neck, distal fracture component displaced superiorly by 1.5 cm. There is significant varus angulation. No other fractures.  No bone lesions. Hip joints, SI joints and pubic symphysis are normally aligned. Skeletal structures are demineralized. IMPRESSION: 1. Displaced, non comminuted, varus angulated right femoral neck fracture. Electronically Signed   By: Lajean Manes M.D.   On: 06/27/2022 13:23    Positive ROS: All other systems have been reviewed and were otherwise negative with  the exception of those mentioned in the HPI and as above.  Physical Exam: General: Patient confused and pulling off her shirt.  Her eyes remain closed.  She is unable to follow commands or participate with the physical exam  MUSCULOSKELETAL: Right lower extremity: Patient skin is intact.  There is no erythema ecchymosis or swelling.  Patient's thigh and leg compartments are soft and compressible.  She has shortening and  external rotation to the right lower extremity.  Patient has normal pedal pulses.  Motor and sensory function could not be assessed due to patient's confusion.  Assessment: Right displaced femoral neck hip fracture  Plan: I reviewed the details of the operation as well as the postoperative course with the patient's family.   I discussed the risks and benefits of surgery. The risks include but are not limited to infection, bleeding requiring blood transfusion, nerve or blood vessel injury, joint stiffness or loss of motion, persistent pain, weakness or instability, leg length discrepancy, change in lower extremity rotation, fracture, dislocation hardware failure and the need for further surgery including conversion to left total hip arthroplasty. Medical risks include but are not limited to DVT and pulmonary embolism, myocardial infarction, stroke, pneumonia, respiratory failure and death. Patient's family understood these risks and wished to proceed with surgery.   I reviewed the patient's labs and x-rays in preparation for this case.  Patient will be n.p.o. after midnight.  Patient will receive 1 dose of heparin at 2200 but should not receive any further anticoagulation after that preparation for surgery tomorrow between 10 and 11 AM.  Consent and preop antibiotics have been ordered.    Thornton Park, MD    06/27/2022 6:32 PM

## 2022-06-27 NOTE — ED Notes (Signed)
Pt's family states due to pt's dementia it is baseline for her to only know herself.

## 2022-06-27 NOTE — ED Notes (Signed)
Lt grn, blue, lav and pink tubes sent to lab.

## 2022-06-27 NOTE — Hospital Course (Addendum)
[name] is a 82 year old female with dementia, hypertension, hyperlipidemia, hypothyroid, GERD, who presents emergency department for chief concerns of mechanical fall and right hip pain.  Initial vitals in the ED showed temperature of 98, respiration rate of 16, heart rate of 108, blood pressure 141/85, SpO2 of 91% on room air.  Patient was noted to be sleeping and that her oxygen saturations dropped to the 80s and patient was placed on 2 L nasal cannula with appropriate function saturation improvement.  Serum sodium is 136, potassium 4.1, chloride 99, bicarb 26, BUN of 24, serum creatinine of 1.02, EGFR is 55, nonfasting blood glucose 181, WBC 5.8, hemoglobin 11, platelets of 155.  CT of the head without contrast and CT cervical spine without contrast: No acute injury or fracture.  Right hip x-ray 2-3 views: Displaced, noncommitted varus angulation right femoral neck fracture  ED treatment: Acetaminophen 1000 mg p.o. one-time dose, morphine 2 mg IV one-time dose, ondansetron 4 mg IV.  EDP consulted orthopedic service, Dr. Mack Guise who states he will see the patient.  1/11: Vitals and labs stable.  Respiratory panel negative.  Going to OR with orthopedic surgery later today.  1/12: Vitals stable.  S/p right hip hemiarthroplasty, hemoglobin decreased to 9.5 postsurgically.  BMP seems stable with very mild worsening of creatinine to 1.13 but remained within baseline. PT/OT are recommending SNF.  1/13: Patient became febrile at 101, urine cultures with pansensitive E. coli.  Patient was placed on ceftriaxone on 1/12.  Hemoglobin with further decreased to 8.4, creatinine improving.  1/14: Patient remained afebrile after 1 incidence of being febrile yesterday morning.  Hemoglobin seems stable at 8.4.  Chest x-ray was repeated after nursing complaint of new onset cough and it shows linear atelectasis at left lung base along with prominence of bilateral hilar region which may reflect  underlying pulmonary hypertension. Switching her to Keflex as she is able to take p.o. now.  Will complete 5-day course for UTI Awaiting SNF placement.  1/15: Hemodynamically stable with no new concern.  Still no bed offer.  Awaiting SNF placement.  1/16: Remained stable, had a bed offer, pending insurance authorization.

## 2022-06-27 NOTE — ED Notes (Signed)
Pt given 2 warm blankets

## 2022-06-27 NOTE — Assessment & Plan Note (Signed)
Secondary to mechanical fall. Going to the OR with orthopedic surgery today. -Continue with pain management -Fall precautions

## 2022-06-27 NOTE — ED Notes (Signed)
EKG to EDP S. Wong in person.  

## 2022-06-27 NOTE — ED Triage Notes (Signed)
Pt was brought to the ER from [facility] after falling last pm, pt was a witnessed fall and staff stated it appeared that her knees gave way. Pt is c/o right hip and knee pain but is noted to not want to bend the left knee. Pt has dementia and is accompanied by her daughter

## 2022-06-27 NOTE — ED Notes (Signed)
To bedside to place IV line and collect blood-work but pt away at imaging.

## 2022-06-27 NOTE — ED Notes (Signed)
No obvious deformity to either of pt's knees currently; pt does not react when knees palpated; 2+ dorsalis pedis pulse currently in both of pt's feet; feet warm and appropriate in color.

## 2022-06-27 NOTE — Assessment & Plan Note (Signed)
-   Appears to be at baseline 

## 2022-06-28 ENCOUNTER — Inpatient Hospital Stay: Payer: Medicare HMO | Admitting: Certified Registered"

## 2022-06-28 ENCOUNTER — Encounter
Admission: EM | Disposition: A | Discharge status: Skilled Nursing Facility | Payer: Self-pay | Source: Skilled Nursing Facility | Attending: Internal Medicine

## 2022-06-28 ENCOUNTER — Encounter: Payer: Self-pay | Admitting: Internal Medicine

## 2022-06-28 ENCOUNTER — Inpatient Hospital Stay: Payer: Medicare HMO

## 2022-06-28 ENCOUNTER — Other Ambulatory Visit: Payer: Self-pay

## 2022-06-28 DIAGNOSIS — E43 Unspecified severe protein-calorie malnutrition: Secondary | ICD-10-CM | POA: Diagnosis present

## 2022-06-28 DIAGNOSIS — S72001A Fracture of unspecified part of neck of right femur, initial encounter for closed fracture: Secondary | ICD-10-CM | POA: Diagnosis not present

## 2022-06-28 HISTORY — PX: HIP ARTHROPLASTY: SHX981

## 2022-06-28 LAB — URINALYSIS, ROUTINE W REFLEX MICROSCOPIC
Bilirubin Urine: NEGATIVE
Glucose, UA: NEGATIVE mg/dL
Hgb urine dipstick: NEGATIVE
Ketones, ur: 5 mg/dL — AB
Nitrite: POSITIVE — AB
Protein, ur: 100 mg/dL — AB
Specific Gravity, Urine: 1.021 (ref 1.005–1.030)
WBC, UA: >50 WBC/hpf — ABNORMAL HIGH (ref 0–5)
pH: 5 (ref 5.0–8.0)

## 2022-06-28 LAB — BASIC METABOLIC PANEL
Anion gap: 9 (ref 5–15)
BUN: 25 mg/dL — ABNORMAL HIGH (ref 8–23)
CO2: 28 mmol/L (ref 22–32)
Calcium: 8.7 mg/dL — ABNORMAL LOW (ref 8.9–10.3)
Chloride: 99 mmol/L (ref 98–111)
Creatinine, Ser: 1.09 mg/dL — ABNORMAL HIGH (ref 0.44–1.00)
GFR, Estimated: 50 mL/min — ABNORMAL LOW (ref >60)
Glucose, Bld: 146 mg/dL — ABNORMAL HIGH (ref 70–99)
Potassium: 4.1 mmol/L (ref 3.5–5.1)
Sodium: 136 mmol/L (ref 135–145)

## 2022-06-28 LAB — CBC
HCT: 33.3 % — ABNORMAL LOW (ref 36.0–46.0)
Hemoglobin: 10.8 g/dL — ABNORMAL LOW (ref 12.0–15.0)
MCH: 27.9 pg (ref 26.0–34.0)
MCHC: 32.4 g/dL (ref 30.0–36.0)
MCV: 86 fL (ref 80.0–100.0)
Platelets: 161 10*3/uL (ref 150–400)
RBC: 3.87 MIL/uL (ref 3.87–5.11)
RDW: 13.9 % (ref 11.5–15.5)
WBC: 6.8 10*3/uL (ref 4.0–10.5)
nRBC: 0 % (ref 0.0–0.2)

## 2022-06-28 LAB — ABO/RH: ABO/RH(D): AB POS

## 2022-06-28 LAB — SURGICAL PCR SCREEN
MRSA, PCR: NEGATIVE
Staphylococcus aureus: NEGATIVE

## 2022-06-28 SURGERY — HEMIARTHROPLASTY, HIP, DIRECT ANTERIOR APPROACH, FOR FRACTURE
Anesthesia: Spinal | Site: Hip | Laterality: Right

## 2022-06-28 MED ORDER — TRANEXAMIC ACID-NACL 1000-0.7 MG/100ML-% IV SOLN
INTRAVENOUS | Status: AC
  Administered 2022-06-28: 1000 mg via INTRAVENOUS
  Filled 2022-06-28: qty 100

## 2022-06-28 MED ORDER — ONDANSETRON HCL 4 MG/2ML IJ SOLN: 4.0000 mg | Freq: Four times a day (QID) | INTRAMUSCULAR | Status: DC | PRN

## 2022-06-28 MED ORDER — CEFAZOLIN SODIUM-DEXTROSE 2-4 GM/100ML-% IV SOLN
2.0000 g | Freq: Four times a day (QID) | INTRAVENOUS | Status: AC
  Administered 2022-06-29: 2 g via INTRAVENOUS
  Filled 2022-06-28 (×2): qty 100

## 2022-06-28 MED ORDER — ENOXAPARIN SODIUM 40 MG/0.4ML IJ SOSY
40.0000 mg | PREFILLED_SYRINGE | INTRAMUSCULAR | Status: DC
  Administered 2022-06-29 – 2022-07-04 (×6): 40 mg via SUBCUTANEOUS
  Filled 2022-06-28 (×6): qty 0.4

## 2022-06-28 MED ORDER — CEFAZOLIN SODIUM-DEXTROSE 2-4 GM/100ML-% IV SOLN
INTRAVENOUS | Status: AC
  Administered 2022-06-28: 2 g via INTRAVENOUS
  Filled 2022-06-28: qty 100

## 2022-06-28 MED ORDER — NEOMYCIN-POLYMYXIN B GU 40-200000 IR SOLN
Status: AC
  Filled 2022-06-28: qty 20

## 2022-06-28 MED ORDER — FENTANYL CITRATE (PF) 100 MCG/2ML IJ SOLN: 25.0000 ug | INTRAMUSCULAR | Status: DC | PRN

## 2022-06-28 MED ORDER — MENTHOL 3 MG MT LOZG: 1.0000 | LOZENGE | OROMUCOSAL | Status: DC | PRN

## 2022-06-28 MED ORDER — ONDANSETRON HCL 4 MG/2ML IJ SOLN
INTRAMUSCULAR | Status: DC | PRN
  Administered 2022-06-28: 4 mg via INTRAVENOUS

## 2022-06-28 MED ORDER — SODIUM CHLORIDE 0.9 % IR SOLN
Status: DC | PRN
  Administered 2022-06-28: 3000 mL

## 2022-06-28 MED ORDER — GLYCOPYRROLATE 0.2 MG/ML IJ SOLN
INTRAMUSCULAR | Status: DC | PRN
  Administered 2022-06-28: .2 mg via INTRAVENOUS

## 2022-06-28 MED ORDER — ALUM & MAG HYDROXIDE-SIMETH 200-200-20 MG/5ML PO SUSP: 30.0000 mL | ORAL | Status: DC | PRN

## 2022-06-28 MED ORDER — TRAMADOL HCL 50 MG PO TABS
50.0000 mg | ORAL_TABLET | Freq: Four times a day (QID) | ORAL | Status: DC
  Administered 2022-06-28 – 2022-07-04 (×20): 50 mg via ORAL
  Filled 2022-06-28 (×20): qty 1

## 2022-06-28 MED ORDER — ENSURE ENLIVE PO LIQD
237.0000 mL | Freq: Two times a day (BID) | ORAL | Status: DC
  Administered 2022-06-29 – 2022-07-02 (×5): 237 mL via ORAL

## 2022-06-28 MED ORDER — HYDROMORPHONE HCL 1 MG/ML IJ SOLN
INTRAMUSCULAR | Status: AC
  Filled 2022-06-28: qty 1

## 2022-06-28 MED ORDER — FENTANYL CITRATE (PF) 100 MCG/2ML IJ SOLN
INTRAMUSCULAR | Status: AC
  Filled 2022-06-28: qty 2

## 2022-06-28 MED ORDER — HYDROCODONE-ACETAMINOPHEN 5-325 MG PO TABS
1.0000 | ORAL_TABLET | ORAL | Status: DC | PRN
  Administered 2022-06-29: 2 via ORAL
  Administered 2022-06-29 – 2022-07-03 (×5): 1 via ORAL
  Filled 2022-06-28: qty 1
  Filled 2022-06-28: qty 2
  Filled 2022-06-28 (×4): qty 1

## 2022-06-28 MED ORDER — MORPHINE SULFATE (PF) 2 MG/ML IV SOLN: 0.5000 mg | INTRAVENOUS | Status: DC | PRN

## 2022-06-28 MED ORDER — POLYETHYLENE GLYCOL 3350 17 G PO PACK
17.0000 g | PACK | Freq: Every day | ORAL | Status: DC | PRN
  Filled 2022-06-28: qty 1

## 2022-06-28 MED ORDER — PROPOFOL 10 MG/ML IV BOLUS
INTRAVENOUS | Status: DC | PRN
  Administered 2022-06-28: 25 ug/kg/min via INTRAVENOUS
  Administered 2022-06-28: 70 mg via INTRAVENOUS
  Administered 2022-06-28: 30 mg via INTRAVENOUS

## 2022-06-28 MED ORDER — ONDANSETRON HCL 4 MG PO TABS: 4.0000 mg | ORAL_TABLET | Freq: Four times a day (QID) | ORAL | Status: DC | PRN

## 2022-06-28 MED ORDER — ROCURONIUM BROMIDE 100 MG/10ML IV SOLN
INTRAVENOUS | Status: DC | PRN
  Administered 2022-06-28: 50 mg via INTRAVENOUS

## 2022-06-28 MED ORDER — PHENYLEPHRINE HCL (PRESSORS) 10 MG/ML IV SOLN
INTRAVENOUS | Status: DC | PRN
  Administered 2022-06-28: 80 ug via INTRAVENOUS

## 2022-06-28 MED ORDER — BUPIVACAINE-EPINEPHRINE 0.5% -1:200000 IJ SOLN
INTRAMUSCULAR | Status: DC | PRN
  Administered 2022-06-28: 30 mL

## 2022-06-28 MED ORDER — SODIUM CHLORIDE 0.9 % IR SOLN
Status: DC | PRN
  Administered 2022-06-28: 1004 mL

## 2022-06-28 MED ORDER — FENTANYL CITRATE (PF) 100 MCG/2ML IJ SOLN
INTRAMUSCULAR | Status: DC | PRN
  Administered 2022-06-28 (×2): 50 ug via INTRAVENOUS

## 2022-06-28 MED ORDER — HYDROMORPHONE HCL 1 MG/ML IJ SOLN
INTRAMUSCULAR | Status: DC | PRN
  Administered 2022-06-28: 1 mg via INTRAVENOUS

## 2022-06-28 MED ORDER — ACETAMINOPHEN 500 MG PO TABS
500.0000 mg | ORAL_TABLET | Freq: Four times a day (QID) | ORAL | Status: AC
  Administered 2022-06-28 – 2022-06-29 (×3): 500 mg via ORAL
  Filled 2022-06-28 (×3): qty 1

## 2022-06-28 MED ORDER — TRANEXAMIC ACID-NACL 1000-0.7 MG/100ML-% IV SOLN
INTRAVENOUS | Status: DC | PRN
  Administered 2022-06-28: 1000 mg via INTRAVENOUS

## 2022-06-28 MED ORDER — LACTATED RINGERS IV SOLN: INTRAVENOUS | Status: DC | PRN

## 2022-06-28 MED ORDER — SODIUM CHLORIDE 0.9 % IV SOLN: INTRAVENOUS | Status: DC

## 2022-06-28 MED ORDER — ACETAMINOPHEN 325 MG PO TABS
325.0000 mg | ORAL_TABLET | Freq: Four times a day (QID) | ORAL | Status: DC | PRN
  Administered 2022-06-30 (×2): 650 mg via ORAL
  Administered 2022-07-02: 325 mg via ORAL
  Filled 2022-06-28: qty 1
  Filled 2022-06-28 (×2): qty 2

## 2022-06-28 MED ORDER — SUGAMMADEX SODIUM 200 MG/2ML IV SOLN
INTRAVENOUS | Status: DC | PRN
  Administered 2022-06-28: 200 mg via INTRAVENOUS

## 2022-06-28 MED ORDER — PHENOL 1.4 % MT LIQD: 1.0000 | OROMUCOSAL | Status: DC | PRN

## 2022-06-28 MED ORDER — PHENYLEPHRINE HCL-NACL 20-0.9 MG/250ML-% IV SOLN
INTRAVENOUS | Status: DC | PRN
  Administered 2022-06-28: 160 ug via INTRAVENOUS
  Administered 2022-06-28 (×3): 80 ug via INTRAVENOUS
  Administered 2022-06-28: 10 ug/min via INTRAVENOUS

## 2022-06-28 MED ORDER — SENNA 8.6 MG PO TABS
1.0000 | ORAL_TABLET | Freq: Two times a day (BID) | ORAL | Status: DC
  Administered 2022-06-28 – 2022-07-04 (×12): 8.6 mg via ORAL
  Filled 2022-06-28 (×12): qty 1

## 2022-06-28 MED ORDER — BUPIVACAINE-EPINEPHRINE (PF) 0.5% -1:200000 IJ SOLN
INTRAMUSCULAR | Status: AC
  Filled 2022-06-28: qty 30

## 2022-06-28 MED ORDER — TRANEXAMIC ACID-NACL 1000-0.7 MG/100ML-% IV SOLN: 1000.0000 mg | Freq: Once | INTRAVENOUS | Status: AC

## 2022-06-28 MED ORDER — ONDANSETRON HCL 4 MG/2ML IJ SOLN: 4.0000 mg | Freq: Once | INTRAMUSCULAR | Status: DC | PRN

## 2022-06-28 MED ORDER — TRANEXAMIC ACID 1000 MG/10ML IV SOLN
INTRAVENOUS | Status: AC
  Filled 2022-06-28: qty 10

## 2022-06-28 MED ORDER — BISACODYL 10 MG RE SUPP: 10.0000 mg | Freq: Every day | RECTAL | Status: DC | PRN

## 2022-06-28 MED ORDER — DOCUSATE SODIUM 100 MG PO CAPS
100.0000 mg | ORAL_CAPSULE | Freq: Two times a day (BID) | ORAL | Status: DC
  Administered 2022-06-28 – 2022-07-04 (×9): 100 mg via ORAL
  Filled 2022-06-28 (×10): qty 1

## 2022-06-28 SURGICAL SUPPLY — 54 items
BLADE SAGITTAL WIDE XTHICK NO (BLADE) ×1 IMPLANT
BLADE SURG SZ10 CARB STEEL (BLADE) ×1 IMPLANT
BNDG COHESIVE 4X5 TAN STRL LF (GAUZE/BANDAGES/DRESSINGS) ×1 IMPLANT
COVER BACK TABLE REUSABLE LG (DRAPES) ×1 IMPLANT
DRAPE 3/4 80X56 (DRAPES) ×2 IMPLANT
DRAPE INCISE IOBAN 66X60 STRL (DRAPES) ×1 IMPLANT
DRAPE ORTHO SPLIT 77X108 STRL (DRAPES) ×2
DRAPE SURG 17X11 SM STRL (DRAPES) ×1 IMPLANT
DRAPE SURG ORHT 6 SPLT 77X108 (DRAPES) ×2 IMPLANT
DRAPE U-SHAPE 48X52 POLY STRL (PACKS) ×1 IMPLANT
DRSG AQUACEL AG ADV 3.5X10 (GAUZE/BANDAGES/DRESSINGS) ×1 IMPLANT
DURAPREP 26ML APPLICATOR (WOUND CARE) ×4 IMPLANT
ELECT CAUTERY BLADE 6.4 (BLADE) ×1 IMPLANT
ELECT REM PT RETURN 9FT ADLT (ELECTROSURGICAL) ×1
ELECTRODE REM PT RTRN 9FT ADLT (ELECTROSURGICAL) ×1 IMPLANT
GAUZE 4X4 16PLY ~~LOC~~+RFID DBL (SPONGE) ×1 IMPLANT
GAUZE XEROFORM 1X8 LF (GAUZE/BANDAGES/DRESSINGS) ×2 IMPLANT
GLOVE BIOGEL PI IND STRL 9 (GLOVE) ×1 IMPLANT
GLOVE BIOGEL PI ORTHO SZ9 (GLOVE) ×4 IMPLANT
GOWN STRL REUS TWL 2XL XL LVL4 (GOWN DISPOSABLE) ×1 IMPLANT
GOWN STRL REUS W/ TWL LRG LVL3 (GOWN DISPOSABLE) ×1 IMPLANT
GOWN STRL REUS W/TWL LRG LVL3 (GOWN DISPOSABLE) ×1
HEAD MODULAR ENDO (Orthopedic Implant) ×1 IMPLANT
HEAD UNPLR 48XMDLR STRL HIP (Orthopedic Implant) IMPLANT
HOLDER FOLEY CATH W/STRAP (MISCELLANEOUS) ×1 IMPLANT
HOLSTER ELECTROSUGICAL PENCIL (MISCELLANEOUS) ×1 IMPLANT
IV NS IRRIG 3000ML ARTHROMATIC (IV SOLUTION) ×1 IMPLANT
KIT TURNOVER KIT A (KITS) ×1 IMPLANT
MANIFOLD NEPTUNE II (INSTRUMENTS) ×1 IMPLANT
NDL FILTER BLUNT 18X1 1/2 (NEEDLE) ×1 IMPLANT
NDL MAYO CATGUT SZ4 TPR NDL (NEEDLE) ×1 IMPLANT
NDL SAFETY ECLIP 18X1.5 (MISCELLANEOUS) ×1 IMPLANT
NEEDLE FILTER BLUNT 18X1 1/2 (NEEDLE) ×1 IMPLANT
NEEDLE MAYO CATGUT SZ4 (NEEDLE) ×1 IMPLANT
NS IRRIG 1000ML POUR BTL (IV SOLUTION) ×1 IMPLANT
PACK HIP PROSTHESIS (MISCELLANEOUS) ×1 IMPLANT
PILLOW ABDUCTION FOAM SM (MISCELLANEOUS) ×1 IMPLANT
PULSAVAC PLUS IRRIG FAN TIP (DISPOSABLE) ×1
RETRIEVER SUT HEWSON (MISCELLANEOUS) IMPLANT
SLEEVE UNITRAX V40 STD (Orthopedic Implant) IMPLANT
SPONGE T-LAP 18X18 ~~LOC~~+RFID (SPONGE) ×1 IMPLANT
STAPLER SKIN PROX 35W (STAPLE) ×1 IMPLANT
STEM HIP 4 127DEG (Stem) IMPLANT
SUT TICRON 2-0 30IN 311381 (SUTURE) ×4 IMPLANT
SUT VIC AB 0 CT1 36 (SUTURE) ×1 IMPLANT
SUT VIC AB 2-0 CT2 27 (SUTURE) ×2 IMPLANT
SYR 10ML LL (SYRINGE) ×1 IMPLANT
TAPE TRANSPORE STRL 2 31045 (GAUZE/BANDAGES/DRESSINGS) ×1 IMPLANT
TIP BRUSH PULSAVAC PLUS 24.33 (MISCELLANEOUS) ×1 IMPLANT
TIP FAN IRRIG PULSAVAC PLUS (DISPOSABLE) ×1 IMPLANT
TRAP FLUID SMOKE EVACUATOR (MISCELLANEOUS) ×1 IMPLANT
TRAY FOLEY SLVR 16FR LF STAT (SET/KITS/TRAYS/PACK) ×1 IMPLANT
TUBE SUCT KAM VAC (TUBING) IMPLANT
WATER STERILE IRR 500ML POUR (IV SOLUTION) ×1 IMPLANT

## 2022-06-28 NOTE — Anesthesia Procedure Notes (Signed)
Procedure Name: Intubation Date/Time: 06/28/2022 12:15 PM  Performed by: Patience Musca., CRNAPre-anesthesia Checklist: Patient identified, Patient being monitored, Timeout performed, Emergency Drugs available and Suction available Patient Re-evaluated:Patient Re-evaluated prior to induction Oxygen Delivery Method: Circle system utilized Preoxygenation: Pre-oxygenation with 100% oxygen Induction Type: IV induction Ventilation: Mask ventilation without difficulty Laryngoscope Size: 3 and McGraph Grade View: Grade I Tube type: Oral Tube size: 6.5 mm Number of attempts: 1 Airway Equipment and Method: Stylet Placement Confirmation: ETT inserted through vocal cords under direct vision, positive ETCO2 and breath sounds checked- equal and bilateral Secured at: 21 cm Tube secured with: Tape Dental Injury: Teeth and Oropharynx as per pre-operative assessment

## 2022-06-28 NOTE — Plan of Care (Signed)
Patient sleeping between care. Alert and oriented to self only. No acute distress noted. Oxygen on at 2L [employer]. NPO since midnight. Bed alarm on.     Problem: Education: Goal: Knowledge of General Education information will improve Description: Including pain rating scale, medication(s)/side effects and non-pharmacologic comfort measures Outcome: Progressing   Problem: Health Behavior/Discharge Planning: Goal: Ability to manage health-related needs will improve Outcome: Progressing   Problem: Clinical Measurements: Goal: Ability to maintain clinical measurements within normal limits will improve Outcome: Progressing Goal: Will remain free from infection Outcome: Progressing Goal: Diagnostic test results will improve Outcome: Progressing Goal: Respiratory complications will improve Outcome: Progressing Goal: Cardiovascular complication will be avoided Outcome: Progressing   Problem: Activity: Goal: Risk for activity intolerance will decrease Outcome: Progressing   Problem: Nutrition: Goal: Adequate nutrition will be maintained Outcome: Progressing   Problem: Coping: Goal: Level of anxiety will decrease Outcome: Progressing   Problem: Elimination: Goal: Will not experience complications related to bowel motility Outcome: Progressing Goal: Will not experience complications related to urinary retention Outcome: Progressing   Problem: Pain Managment: Goal: General experience of comfort will improve Outcome: Progressing   Problem: Safety: Goal: Ability to remain free from injury will improve Outcome: Progressing   Problem: Skin Integrity: Goal: Risk for impaired skin integrity will decrease Outcome: Progressing

## 2022-06-28 NOTE — TOC Progression Note (Signed)
Transition of Care [facility]) - Progression Note    Patient Details  Name: Angel Cameron MRN: [id number] Date of Birth: 1940-10-07  Transition of [facility] CM/SW [place], RN Phone Number: 06/28/2022, 9:40 AM  Clinical Narrative:     The patient resides at [facility], Surgery today, TOC to follow and assist with DC planning, will likely need STR  Expected Discharge Plan: [place] Barriers to Discharge: Continued Medical Work up, SNF Pending bed offer, Insurance Authorization  Expected Discharge Plan and [place] arrangements for the past 2 months: [place] ([place] care)                                       Social Determinants of Health (SDOH) Interventions SDOH Screenings   Tobacco Use: Low Risk  (06/27/2022)    Readmission Risk Interventions     No data to display

## 2022-06-28 NOTE — Transfer of Care (Signed)
Immediate Anesthesia Transfer of Care Note  Patient: Angel Cameron  Procedure(s) Performed: ARTHROPLASTY BIPOLAR HIP (HEMIARTHROPLASTY) (Right: Hip)  Patient Location: PACU  Anesthesia Type:General  Level of Consciousness: drowsy, patient cooperative, and responds to stimulation  Airway & Oxygen Therapy: Patient connected to face mask oxygen  Post-op Assessment: Report given to RN and Post -op Vital signs reviewed and stable  Post vital signs: stable  Last Vitals:  Vitals Value Taken Time  BP 107/64 06/28/22 1415  Temp    Pulse 77 06/28/22 1419  Resp 12 06/28/22 1419  SpO2 97 % 06/28/22 1419  Vitals shown include unvalidated device data.  Last Pain:  Vitals:   06/28/22 1048  TempSrc: Oral         Complications: No notable events documented.

## 2022-06-28 NOTE — Progress Notes (Signed)
Initial Nutrition Assessment  DOCUMENTATION CODES:   Severe malnutrition in context of chronic illness  INTERVENTION:   -Once diet is advanced, add:   -Ensure Enlive po BID, each supplement provides 350 kcal and 20 grams of protein -MVI with minerals daily -Magic cup TID with meals, each supplement provides 290 kcal and 9 grams of protein   NUTRITION DIAGNOSIS:   Severe Malnutrition related to chronic illness (dementia) as evidenced by moderate fat depletion, severe fat depletion, moderate muscle depletion, severe muscle depletion, percent weight loss.  GOAL:   Patient will meet greater than or equal to 90% of their needs  MONITOR:   PO intake, Supplement acceptance  REASON FOR ASSESSMENT:   Consult Hip fracture protocol, Assessment of nutrition requirement/status  ASSESSMENT:   Pt with dementia, hypertension, hyperlipidemia, hypothyroid, GERD, who presents for chief concerns of mechanical fall and right hip pain.  Pt admitted with closed rt hip fracture.   Spoke with pt at bedside. Pt pleasantly confused and unable to provide any significant history. Pt replied "oh yeah" to most of the questions RD asked. No family or supports at bedside to provide further history.    Pt reports she had a good appetite PTA, however, questions accuracy of this statement given pt's malnutrition and significant wt loss.   Per orthopedics notes, pt currently NPO for planned procedure today.   Reviewed wt hx; pt has experienced a 20% wt loss over the past 5 months, which is significant for time frame.   Medications reviewed.   Labs reviewed.   NUTRITION - FOCUSED PHYSICAL EXAM:  Flowsheet Row Most Recent Value  Orbital Region Severe depletion  Upper Arm Region Severe depletion  Thoracic and Lumbar Region Moderate depletion  Buccal Region Moderate depletion  Temple Region Severe depletion  Clavicle Bone Region Severe depletion  Clavicle and Acromion Bone Region Severe depletion   Scapular Bone Region Severe depletion  Dorsal Hand Moderate depletion  Patellar Region Mild depletion  Anterior Thigh Region Mild depletion  Posterior Calf Region Mild depletion  Edema (RD Assessment) Moderate  Hair Reviewed  Eyes Reviewed  Mouth Reviewed  Skin Reviewed  Nails Reviewed       Diet Order:   Diet Order             Diet NPO time specified Except for: Sips with Meds  Diet effective midnight                   EDUCATION NEEDS:   Not appropriate for education at this time  Skin:  Skin Assessment: Reviewed RN Assessment  Last BM:  Unknown  Height:   Ht Readings from Last 1 Encounters:  06/27/22 5\' 2"  (1.575 m)    Weight:   Wt Readings from Last 1 Encounters:  06/27/22 60.8 kg    Ideal Body Weight:  50 kg  BMI:  Body mass index is 24.51 kg/m.  Estimated Nutritional Needs:   Kcal:  1800-2000  Protein:  90-105 grams  Fluid:  > 1.8 L    Loistine Chance, RD, LDN, [place] Registered Dietitian II Certified Diabetes Care and Education Specialist Please refer to [facility] for RD and/or RD on-call/weekend/after hours pager

## 2022-06-28 NOTE — Progress Notes (Signed)
The patient has been re-examined, and the chart reviewed, and there have been no interval changes to the documented history and physical.  She is more alert today.  She is confused by in no acute distress.  Patient's family in the pre-op area with the patient.  The risks, benefits, and alternatives have been discussed at length, and the patient's family is willing to proceed.

## 2022-06-28 NOTE — Op Note (Signed)
06/28/2022  2:45 PM  PATIENT:  Angel Cameron   MRN: [id number]  PRE-OPERATIVE DIAGNOSIS:  RIGHT FEMORAL NECK FRACTURE  POST-OPERATIVE DIAGNOSIS:  RIGHT FEMORAL NECK FRACTURE  PROCEDURE:  Procedure(s): Right hip hemiarthroplasty  PREOPERATIVE INDICATIONS:    Angel Cameron is an 82 y.o. female who was admitted with a diagnosis of displaced right femoral neck hip fracture after a fall.  Send has a history of advanced dementia, but is ambulatory at baseline..  I have recommended surgical fixation with hemiarthroplasty for the patient's fracture. I have explained the surgery and the postoperative course to the patient's family who have agreed with surgical management of this fracture.    The risks benefits and alternatives were discussed with the patient's family including but not limited to the risks of  infection requiring removal of the prosthesis, bleeding requiring blood transfusion, nerve injury especially to the sciatic nerve leading to foot drop or lower extremity numbness, periprosthetic fracture, dislocation leg length discrepancy, change in lower extremity rotation persistent hip pain, loosening or failure of the components and the need for revision surgery. Medical risks include but are not limited to DVT and pulmonary embolism, myocardial infarction, stroke, pneumonia, respiratory failure and death.  OPERATIVE REPORT     SURGEON:  Thornton Park, MD    ASSISTANT:  Danae Orleans, PA    ANESTHESIA:  General    COMPLICATIONS:  None.   SPECIMEN: Femoral head to pathology    COMPONENTS:  Stryker Accolade 2 femoral component size 4 with a Stryker Unitrax 48 mm femoral head component and +0 neck adjustment sleeve.    PROCEDURE IN DETAIL:   The patient was met in the holding area and  identified.  The appropriate hip was identified and marked at the operative site after verbally confirming with the patient that this was the correct site of surgery.  The patient was then  transported to the OR  and underwent general anesthesia. The patient was then placed in the lateral decubitus position with the operative side up and secured on the operating room table with a pegboard and all bony prominences were adequately padded. This included an axillary roll and additional padding around the nonoperative leg to prevent compression to the common peroneal nerve.    The operative lower extremity was prepped and draped in a sterile fashion.  A time out was performed prior to incision to verify patient's name, date of birth, medical record number, correct site of surgery correct procedure to be performed. The timeout was also used to verify the patient received antibiotics now appropriate instruments, implants and radiographic studies were available in the room. Once all in attendance were in agreement case began.    A posterolateral approach was utilized via sharp dissection  carried down to the subcutaneous tissue.  Bleeding vessels were coagulated using electrocautery.  The fascia lata was identified and incised along the length of the skin incision.  The gluteus maximus muscle was then split in line with its fibers. Self-retaining retractors were  inserted.  With the hip internally rotated, the short external rotators  were identified and removed from the posterior attachment from the greater trochanter. The piriformis was tagged for later repair. The capsule was identified and a T-shaped capsulotomy was performed. The capsule was tagged with #2 Tycron for later repair.  The femoral neck fracture was exposed, and the femoral head was removed using a corkscrew device. This was measured to be 48 mm in diameter. The attention was then turned to  proximal femur preparation.  An oscillating saw was used to perform a proximal femoral osteotomy 1 fingerbreadth above the lesser trochanter. The trial 48 mm femoral head was placed into the acetabulum and had an excellent suction fit. The attention  was then turned back to femoral preparation.    A femoral skid and Cobra retractor were placed under the femoral neck to allow for adequate visualization. A box osteotome was used to make the initial entry into the proximal femur. A single hand reamer was used to prepare the femoral canal. A T-shaped femoral canal sounder was then used to ensure no penetration femoral cortex had occurred during reaming. The proximal femur was then sequentially broached by hand. A 4 femoral trial broach was found to have best medial to lateral canal fit. Once adequate mediolateral canal fill was achieved the trial femoral broach, neck, and head was assembled and the hip was reduced. It was found to have excellent stability, equivalent leg lengths with functional range of motion. The trial components were then removed.  I copiously irrigated the femoral canal and then impacted the real femoral prosthesis into place into the appropriate version, slightly anteverted to the normal anatomy, and I impacted the actual 34mm Unitrax femoral component with a +0 neck adjustment sleeve into place. The hip was then reduced and taken through functional range of motion and found to have excellent stability. Leg lengths were restored. The hip joint was copiously irrigated.   A soft tissue repair of the capsule and external rotators was performed using #2 Tycron Excellent posterior capsular repair was achieved. The fascia lata was then closed with interrupted 0 Vicryl suture. The subcutaneous tissues were closed with 2-0 Vicryl and the skin approximated with staples.  30 cc of 0.5% marcaine with epinephrine was injected into the incision site.  The patient was then placed supine on the operative table. Leg lengths were checked clinically and found to be equivalent. An abduction pillow was placed between the lower extremities. The patient was then transferred to a hospital bed and brought to the PACU in stable condition. I was scrubbed and  present the entire case and all sharp and instrument counts were correct at the conclusion of the case. I spoke with the patient's family in the postop consultation room to let them know the case was completed without complication patient was stable in recovery room.   Timoteo Gaul, MD Orthopedic Surgeon

## 2022-06-28 NOTE — Anesthesia Preprocedure Evaluation (Signed)
Anesthesia Evaluation  Patient identified by MRN, date of birth, ID band Patient awake    Reviewed: Allergy & Precautions, H&P , NPO status , Patient's Chart, lab work & pertinent test results, reviewed documented beta blocker date and time   Airway Mallampati: II   Neck ROM: full    Dental  (+) Poor Dentition   Pulmonary neg pulmonary ROS   Pulmonary exam normal        Cardiovascular Exercise Tolerance: Poor hypertension, On Medications negative cardio ROS Normal cardiovascular exam Rhythm:regular Rate:Normal     Neuro/Psych  PSYCHIATRIC DISORDERS     Dementia negative neurological ROS     GI/Hepatic negative GI ROS, Neg liver ROS,,,  Endo/Other  Hypothyroidism    Renal/GU Renal disease  negative genitourinary   Musculoskeletal   Abdominal   Peds  Hematology negative hematology ROS (+)   Anesthesia Other Findings Past Medical History: No date: Hypertension No date: Memory loss Past Surgical History: No date: COLONOSCOPY No date: HERNIA REPAIR BMI    Body Mass Index: 24.51 kg/m     Reproductive/Obstetrics negative OB ROS                             Anesthesia Physical Anesthesia Plan  ASA: 3  Anesthesia Plan: Spinal   Post-op Pain Management:    Induction:   PONV Risk Score and Plan: 3  Airway Management Planned:   Additional Equipment:   Intra-op Plan:   Post-operative Plan:   Informed Consent: I have reviewed the patients History and Physical, chart, labs and discussed the procedure including the risks, benefits and alternatives for the proposed anesthesia with the patient or authorized representative who has indicated his/her understanding and acceptance.     Dental Advisory Given  Plan Discussed with: CRNA  Anesthesia Plan Comments:        Anesthesia Quick Evaluation

## 2022-06-28 NOTE — Assessment & Plan Note (Signed)
Estimated body mass index is 24.51 kg/m as calculated from the following:   Height as of this encounter: 5\' 2"  (1.575 m).   Weight as of this encounter: 60.8 kg.   -Dietitian consult

## 2022-06-28 NOTE — Progress Notes (Signed)
Progress Note   Patient: Angel Cameron KNL:[id number] DOB: [birth year] DOA: 06/27/2022     1 DOS: the patient was seen and examined on 06/28/2022   Brief hospital course: [name] is a 82 year old female with dementia, hypertension, hyperlipidemia, hypothyroid, GERD, who presents emergency department for chief concerns of mechanical fall and right hip pain.  Initial vitals in the ED showed temperature of 98, respiration rate of 16, heart rate of 108, blood pressure 141/85, SpO2 of 91% on room air.  Patient was noted to be sleeping and that her oxygen saturations dropped to the 80s and patient was placed on 2 L nasal cannula with appropriate function saturation improvement.  Serum sodium is 136, potassium 4.1, chloride 99, bicarb 26, BUN of 24, serum creatinine of 1.02, EGFR is 55, nonfasting blood glucose 181, WBC 5.8, hemoglobin 11, platelets of 155.  CT of the head without contrast and CT cervical spine without contrast: No acute injury or fracture.  Right hip x-ray 2-3 views: Displaced, noncommitted varus angulation right femoral neck fracture  ED treatment: Acetaminophen 1000 mg p.o. one-time dose, morphine 2 mg IV one-time dose, ondansetron 4 mg IV.  EDP consulted orthopedic service, Dr. Mack Guise who states he will see the patient.  1/11: Vitals and labs stable.  Respiratory panel negative.  Going to OR with orthopedic surgery later today.  Assessment and Plan: * Closed right hip fracture ([place]) Secondary to mechanical fall. Going to the OR with orthopedic surgery today. -Continue with pain management -Fall precautions  Essential hypertension - Benazepril 20 mg p.o. twice daily metoprolol succinate 50 mg p.o. daily were resumed  Chronic kidney disease (CKD) stage G2/A1, mildly decreased glomerular filtration rate (GFR) between 60-89 mL/min/1.73 square meter and albuminuria creatinine ratio less than 30 mg/g - Appears to be at baseline  Hypothyroidism -  Levothyroxine 75 mcg daily resumed  Dementia without behavioral disturbance (HCC) - Resumed memantine 5 mg daily, donepezil 5 milligrams nightly  Hyperlipidemia - Atorvastatin 10 mg daily resumed  Protein-calorie malnutrition, severe Estimated body mass index is 24.51 kg/m as calculated from the following:   Height as of this encounter: 5\' 2"  (1.575 m).   Weight as of this encounter: 60.8 kg.   -Dietitian consult       Subjective: Patient was seen and examined today.  She is a pleasantly confused lady.  Denies any pain.  Physical Exam: Vitals:   06/27/22 1744 06/27/22 2353 06/28/22 0839 06/28/22 1048  BP: (!) 131/90 116/86 137/60 (!) 154/62  Pulse: (!) 56 65 65 63  Resp:  16 17 18   Temp: 98.3 F (36.8 C) 98.4 F (36.9 C) 98.3 F (36.8 C) 98.3 F (36.8 C)  TempSrc:    Oral  SpO2: 100% 92% 100% 99%  Weight:      Height:       General.  Frail and malnourished elderly lady, in no acute distress. Pulmonary.  Lungs clear bilaterally, normal respiratory effort. CV.  Regular rate and rhythm, no JVD, rub or murmur. Abdomen.  Soft, nontender, nondistended, BS positive. CNS.  Alert and oriented to self only.  No focal neurologic deficit. Extremities.  No edema, no cyanosis, pulses intact and symmetrical. Psychiatry.  Judgment and insight appears impaired.   Data Reviewed: Prior data reviewed  Family Communication:   Disposition: Status is: Inpatient Remains inpatient appropriate because: Severity of illness  Planned Discharge Destination: Skilled nursing facility  DVT prophylaxis.  Lovenox Time spent: 40 minutes  This record has been created using [employer]  voice recognition software. Errors have been sought and corrected,but may not always be located. Such creation errors do not reflect on the standard of care.   Author: Lorella Nimrod, MD 06/28/2022 2:07 PM  For on call review [email or web address].

## 2022-06-29 ENCOUNTER — Encounter: Payer: Self-pay | Admitting: Orthopedic Surgery

## 2022-06-29 DIAGNOSIS — S72001A Fracture of unspecified part of neck of right femur, initial encounter for closed fracture: Secondary | ICD-10-CM | POA: Diagnosis not present

## 2022-06-29 LAB — BASIC METABOLIC PANEL
Anion gap: 8 (ref 5–15)
BUN: 26 mg/dL — ABNORMAL HIGH (ref 8–23)
CO2: 27 mmol/L (ref 22–32)
Calcium: 8.1 mg/dL — ABNORMAL LOW (ref 8.9–10.3)
Chloride: 102 mmol/L (ref 98–111)
Creatinine, Ser: 1.13 mg/dL — ABNORMAL HIGH (ref 0.44–1.00)
GFR, Estimated: 48 mL/min — ABNORMAL LOW (ref >60)
Glucose, Bld: 170 mg/dL — ABNORMAL HIGH (ref 70–99)
Potassium: 4.2 mmol/L (ref 3.5–5.1)
Sodium: 137 mmol/L (ref 135–145)

## 2022-06-29 LAB — CBC
HCT: 29.7 % — ABNORMAL LOW (ref 36.0–46.0)
Hemoglobin: 9.5 g/dL — ABNORMAL LOW (ref 12.0–15.0)
MCH: 27.8 pg (ref 26.0–34.0)
MCHC: 32 g/dL (ref 30.0–36.0)
MCV: 86.8 fL (ref 80.0–100.0)
Platelets: 173 10*3/uL (ref 150–400)
RBC: 3.42 MIL/uL — ABNORMAL LOW (ref 3.87–5.11)
RDW: 14.2 % (ref 11.5–15.5)
WBC: 6.4 10*3/uL (ref 4.0–10.5)
nRBC: 0 % (ref 0.0–0.2)

## 2022-06-29 MED ORDER — FE FUM-VIT C-VIT B12-FA 460-60-0.01-1 MG PO CAPS
1.0000 | ORAL_CAPSULE | Freq: Every day | ORAL | Status: DC
  Administered 2022-06-29 – 2022-07-01 (×3): 1 via ORAL
  Filled 2022-06-29 (×7): qty 1

## 2022-06-29 MED ORDER — SODIUM CHLORIDE 0.9 % IV SOLN
2.0000 g | INTRAVENOUS | Status: DC
  Administered 2022-06-29 – 2022-06-30 (×2): 2 g via INTRAVENOUS
  Filled 2022-06-29: qty 20
  Filled 2022-06-29 (×2): qty 2

## 2022-06-29 NOTE — TOC Progression Note (Signed)
Transition of Care [facility]) - Progression Note    Patient Details  Name: Angel Cameron MRN: [id number] Date of Birth: October 08, 1940  Transition of Care [facility]) CM/SW [place], RN Phone Number: 06/29/2022, 3:55 PM  Clinical Narrative:    Uploaded the requested clinical documents to [place] [facility] for PASSR   Expected Discharge Plan: [place] Barriers to Discharge: Continued Medical Work up, SNF Pending bed offer, Insurance Authorization  Expected Discharge Plan and [place] arrangements for the past 2 months: [place] ([facility] care)                                       Social Determinants of Health (SDOH) Interventions SDOH Screenings   Tobacco Use: Low Risk  (06/29/2022)    Readmission Risk Interventions     No data to display

## 2022-06-29 NOTE — TOC PASRR Note (Signed)
The above named patient is recommended to go to Short Term Rehab for strengthening and gait training for balance.  It is expected that the Short Term Rehab stay will be less than 30 days.  The patient is expected to return home after Rehab.  ?

## 2022-06-29 NOTE — Progress Notes (Signed)
Progress Note   Patient: Angel Cameron WRU:[id number] DOB: 1940/11/22 DOA: 06/27/2022     2 DOS: the patient was seen and examined on 06/29/2022   Brief hospital course: [name] is a 82 year old female with dementia, hypertension, hyperlipidemia, hypothyroid, GERD, who presents emergency department for chief concerns of mechanical fall and right hip pain.  Initial vitals in the ED showed temperature of 98, respiration rate of 16, heart rate of 108, blood pressure 141/85, SpO2 of 91% on room air.  Patient was noted to be sleeping and that her oxygen saturations dropped to the 80s and patient was placed on 2 L nasal cannula with appropriate function saturation improvement.  Serum sodium is 136, potassium 4.1, chloride 99, bicarb 26, BUN of 24, serum creatinine of 1.02, EGFR is 55, nonfasting blood glucose 181, WBC 5.8, hemoglobin 11, platelets of 155.  CT of the head without contrast and CT cervical spine without contrast: No acute injury or fracture.  Right hip x-ray 2-3 views: Displaced, noncommitted varus angulation right femoral neck fracture  ED treatment: Acetaminophen 1000 mg p.o. one-time dose, morphine 2 mg IV one-time dose, ondansetron 4 mg IV.  EDP consulted orthopedic service, Dr. Mack Guise who states he will see the patient.  1/11: Vitals and labs stable.  Respiratory panel negative.  Going to OR with orthopedic surgery later today.  1/12: Vitals stable.  S/p right hip hemiarthroplasty, hemoglobin decreased to 9.5 postsurgically.  BMP seems stable with very mild worsening of creatinine to 1.13 but remained within baseline.  Assessment and Plan: * Closed right hip fracture ([place]) Secondary to mechanical fall, s/p hemiarthroplasty.  Tolerated the procedure well PT/OT are recommending SNF -Continue with pain management -Fall precautions -TOC to work on placement  Essential hypertension - Benazepril 20 mg p.o. twice daily metoprolol succinate 50 mg p.o. daily  were resumed  Chronic kidney disease (CKD) stage G2/A1, mildly decreased glomerular filtration rate (GFR) between 60-89 mL/min/1.73 square meter and albuminuria creatinine ratio less than 30 mg/g - Appears to be at baseline  Hypothyroidism - Levothyroxine 75 mcg daily resumed  Dementia without behavioral disturbance (HCC) - Resumed memantine 5 mg daily, donepezil 5 milligrams nightly  Hyperlipidemia - Atorvastatin 10 mg daily resumed  Protein-calorie malnutrition, severe Estimated body mass index is 24.51 kg/m as calculated from the following:   Height as of this encounter: 5\' 2"  (1.575 m).   Weight as of this encounter: 60.8 kg.   -Dietitian consult       Subjective: Patient was seen and examined today.  Pain is bearable under current regimen.  Able to work with PT.  Physical Exam: Vitals:   06/28/22 1938 06/29/22 0013 06/29/22 0412 06/29/22 0802  BP: (!) 135/59 (!) 102/48 (!) 117/53 108/60  Pulse: 73 79 67 72  Resp: 16 16 18 16   Temp: 98.7 F (37.1 C) 98.4 F (36.9 C) 98.1 F (36.7 C) 97.7 F (36.5 C)  TempSrc: Oral Axillary Oral   SpO2: 96% 97% 96% 99%  Weight:      Height:       General.  Frail and malnourished elderly lady, in no acute distress. Pulmonary.  Lungs clear bilaterally, normal respiratory effort. CV.  Regular rate and rhythm, no JVD, rub or murmur. Abdomen.  Soft, nontender, nondistended, BS positive. CNS.  Alert and oriented .  No focal neurologic deficit. Extremities.  No edema, no cyanosis, pulses intact and symmetrical. Psychiatry.  Judgment and insight appears impaired.    Data Reviewed: Prior data reviewed  Family Communication: Discussed with daughter at bedside  Disposition: Status is: Inpatient Remains inpatient appropriate because: Severity of illness  Planned Discharge Destination: Skilled nursing facility  DVT prophylaxis.  Lovenox Time spent: 42 minutes  This record has been created using Systems analyst.  Errors have been sought and corrected,but may not always be located. Such creation errors do not reflect on the standard of care.   Author: Lorella Nimrod, MD 06/29/2022 3:35 PM  For on call review [email or web address].

## 2022-06-29 NOTE — Assessment & Plan Note (Signed)
-  Benazepril 20 mg p.o. twice daily metoprolol succinate 50 mg p.o. daily were resumed 

## 2022-06-29 NOTE — NC FL2 (Signed)
[place] LEVEL OF CARE FORM     IDENTIFICATION  Patient Name: Angel Cameron Birthdate: 04/14/1941 Sex: female Admission Date (Current Location): 06/27/2022  [facility] and [place] Number:  Engineering geologist and Address:  [facility], [address] [place], [place] [address]      Provider Number: [id number]  Attending Physician Name and Address:  Lorella Nimrod, MD  Relative Name and Phone Number:  Freda Munro [phone]    Current Level of Care: Hospital Recommended Level of Care: [place] Prior Approval Number:    Date Approved/Denied:   PASRR Number: Pending  Discharge Plan: SNF    Current Diagnoses: Patient Active Problem List   Diagnosis Date Noted   Protein-calorie malnutrition, severe 06/28/2022   Closed right hip fracture ([place]) 06/27/2022   Essential hypertension 06/27/2022   Dementia without behavioral disturbance ([place]) 06/27/2022   Hyperlipidemia 06/27/2022   Hypothyroidism 06/27/2022   Chronic kidney disease (CKD) stage G2/A1, mildly decreased glomerular filtration rate (GFR) between 60-89 mL/min/1.73 square meter and albuminuria creatinine ratio less than 30 mg/g 01/10/2019   Proteinuria 01/10/2019    Orientation RESPIRATION BLADDER Height & Weight     Self, Place  Normal External catheter, Incontinent Weight: 60.8 kg Height:  5\' 2"  (157.5 cm)  BEHAVIORAL SYMPTOMS/MOOD NEUROLOGICAL BOWEL NUTRITION STATUS      Incontinent Diet (see dc summary)  AMBULATORY STATUS COMMUNICATION OF NEEDS Skin   Extensive Assist Verbally Normal, Surgical wounds                       Personal Care Assistance Level of Assistance  Bathing, Feeding, Dressing Bathing Assistance: Maximum assistance Feeding assistance: Limited assistance Dressing Assistance: Maximum assistance     Functional Limitations Info             SPECIAL CARE FACTORS FREQUENCY  PT (By licensed PT), OT (By licensed OT)     PT  Frequency: 5 times per week OT Frequency: 5 times per week            Contractures Contractures Info: Not present    Additional Factors Info  Code Status, Allergies Code Status Info: full code Allergies Info: NKDA           Current Medications (06/29/2022):  This is the current hospital active medication list Current Facility-Administered Medications  Medication Dose Route Frequency Provider Last Rate Last Admin   0.9 %  sodium chloride infusion   Intravenous Continuous Thornton Park, MD 75 mL/hr at 06/29/22 0555 Infusion Verify at 06/29/22 0555   acetaminophen (TYLENOL) tablet 325-650 mg  325-650 mg Oral Q6H PRN Thornton Park, MD       acetaminophen (TYLENOL) tablet 500 mg  500 mg Oral Q6H Thornton Park, MD   500 mg at 06/29/22 0552   alum & mag hydroxide-simeth (MAALOX/MYLANTA) 200-200-20 MG/5ML suspension 30 mL  30 mL Oral Q4H PRN Thornton Park, MD       atorvastatin (LIPITOR) tablet 10 mg  10 mg Oral Daily Thornton Park, MD   10 mg at 06/29/22 1010   benazepril (LOTENSIN) tablet 40 mg  40 mg Oral BID Thornton Park, MD   40 mg at 06/29/22 0851   bisacodyl (DULCOLAX) suppository 10 mg  10 mg Rectal Daily PRN Thornton Park, MD       docusate sodium (COLACE) capsule 100 mg  100 mg Oral BID Thornton Park, MD   100 mg at 06/29/22 1010   donepezil (ARICEPT) tablet 5  mg  5 mg Oral QHS Thornton Park, MD   5 mg at 06/28/22 2044   enoxaparin (LOVENOX) injection 40 mg  40 mg Subcutaneous Q24H Thornton Park, MD   40 mg at 06/29/22 1010   Fe Fum-Vit C-Vit B12-FA (TRIGELS-F FORTE) capsule 1 capsule  1 capsule Oral QPC breakfast Lorella Nimrod, MD       feeding supplement (ENSURE ENLIVE / ENSURE PLUS) liquid 237 mL  237 mL Oral BID BM Thornton Park, MD   237 mL at 06/29/22 1011   FLUoxetine (PROZAC) capsule 10 mg  10 mg Oral Daily Thornton Park, MD   10 mg at 06/29/22 0850   HYDROcodone-acetaminophen (NORCO/VICODIN) 5-325 MG per tablet 1-2 tablet  1-2 tablet  Oral Q4H PRN Thornton Park, MD   1 tablet at 06/29/22 9381   levothyroxine (SYNTHROID) tablet 75 mcg  75 mcg Oral QAC breakfast Thornton Park, MD   75 mcg at 06/29/22 8299   meclizine (ANTIVERT) tablet 12.5 mg  12.5 mg Oral BID PRN Thornton Park, MD       memantine [facility]) tablet 5 mg  5 mg Oral Daily Thornton Park, MD   5 mg at 06/29/22 1010   menthol-cetylpyridinium (CEPACOL) lozenge 3 mg  1 lozenge Oral PRN Thornton Park, MD       Or   phenol (CHLORASEPTIC) mouth spray 1 spray  1 spray Mouth/Throat PRN Thornton Park, MD       metoprolol succinate (TOPROL-XL) 24 hr tablet 50 mg  50 mg Oral Daily Thornton Park, MD   50 mg at 06/29/22 0843   morphine (PF) 2 MG/ML injection 0.5-1 mg  0.5-1 mg Intravenous Q2H PRN Thornton Park, MD       multivitamin with minerals tablet 1 tablet  1 tablet Oral Daily Thornton Park, MD   1 tablet at 06/29/22 1009   ondansetron (ZOFRAN) tablet 4 mg  4 mg Oral Q6H PRN Thornton Park, MD       Or   ondansetron [facility]) injection 4 mg  4 mg Intravenous Q6H PRN Thornton Park, MD       pantoprazole (PROTONIX) EC tablet 40 mg  40 mg Oral Daily Thornton Park, MD   40 mg at 06/29/22 1010   polyethylene glycol (MIRALAX / GLYCOLAX) packet 17 g  17 g Oral Daily PRN Thornton Park, MD       senna (SENOKOT) tablet 8.6 mg  1 tablet Oral BID Thornton Park, MD   8.6 mg at 06/29/22 0847   traMADol (ULTRAM) tablet 50 mg  50 mg Oral Q6H Thornton Park, MD   50 mg at 06/29/22 0552   traZODone (DESYREL) tablet 50 mg  50 mg Oral QHS PRN Thornton Park, MD         Discharge Medications: Please see discharge summary for a list of discharge medications.  Relevant Imaging Results:  Relevant Lab Results:   Additional Information [id number] [id number]  Conception Oms, RN

## 2022-06-29 NOTE — Care Management Important Message (Signed)
Important Message  Patient Details  Name: Angel Cameron MRN: [id number] Date of Birth: 05/25/1941   Medicare Important Message Given:  N/A - LOS <3 / Initial given by admissions     [name] 06/29/2022, 11:56 AM

## 2022-06-29 NOTE — Assessment & Plan Note (Signed)
Secondary to mechanical fall, s/p hemiarthroplasty.  Tolerated the procedure well PT/OT are recommending SNF -Continue with pain management -Fall precautions -TOC to work on placement

## 2022-06-29 NOTE — Progress Notes (Addendum)
Subjective:  POD #1 s/p right hip hemiarthroplasty.   Patient has advanced dementia and is very drowsy today.  She is unable to provide history or participate with the physical exam.  Family members at the bedside.  Urine culture sent yesterday and is still pending.  Patient was noted to have pyuria during foley catheter placement in the OR yesterday.    Objective:   VITALS:   Vitals:   06/28/22 1938 06/29/22 0013 06/29/22 0412 06/29/22 0802  BP: (!) 135/59 (!) 102/48 (!) 117/53 108/60  Pulse: 73 79 67 72  Resp: 16 16 18 16   Temp: 98.7 F (37.1 C) 98.4 F (36.9 C) 98.1 F (36.7 C) 97.7 F (36.5 C)  TempSrc: Oral Axillary Oral   SpO2: 96% 97% 96% 99%  Weight:      Height:        PHYSICAL EXAM: Right lower extremity Intact pulses distally Incision: dressing C/D/I No cellulitis present Compartment soft  LABS  Results for orders placed or performed during the hospital encounter of 06/27/22 (from the past 24 hour(s))  CBC     Status: Abnormal   Collection Time: 06/29/22  4:56 AM  Result Value Ref Range   WBC 6.4 4.0 - 10.5 K/uL   RBC 3.42 (L) 3.87 - 5.11 MIL/uL   Hemoglobin 9.5 (L) 12.0 - 15.0 g/dL   HCT 29.7 (L) 36.0 - 46.0 %   MCV 86.8 80.0 - 100.0 fL   MCH 27.8 26.0 - 34.0 pg   MCHC 32.0 30.0 - 36.0 g/dL   RDW 14.2 11.5 - 15.5 %   Platelets 173 150 - 400 K/uL   nRBC 0.0 0.0 - 0.2 %  Basic metabolic panel     Status: Abnormal   Collection Time: 06/29/22  4:56 AM  Result Value Ref Range   Sodium 137 135 - 145 mmol/L   Potassium 4.2 3.5 - 5.1 mmol/L   Chloride 102 98 - 111 mmol/L   CO2 27 22 - 32 mmol/L   Glucose, Bld 170 (H) 70 - 99 mg/dL   BUN 26 (H) 8 - 23 mg/dL   Creatinine, Ser 1.13 (H) 0.44 - 1.00 mg/dL   Calcium 8.1 (L) 8.9 - 10.3 mg/dL   GFR, Estimated 48 (L) >60 mL/min   Anion gap 8 5 - 15    DG Hip Port Unilat With Pelvis 1V Right  Result Date: 06/28/2022 CLINICAL DATA:  Status post right total hip replacement. EXAM: DG HIP (WITH OR WITHOUT  PELVIS) 1V PORT RIGHT COMPARISON:  June 27, 2022. FINDINGS: Status post right hip hemiarthroplasty. Expected postoperative changes are seen in the surrounding soft tissues. IMPRESSION: Status post right hip hemiarthroplasty. Electronically Signed   By: Marijo Conception M.D.   On: 06/28/2022 15:56   CT HEAD WO CONTRAST (5MM)  Result Date: 06/27/2022 CLINICAL DATA:  Trauma, fall EXAM: CT HEAD WITHOUT CONTRAST TECHNIQUE: Contiguous axial images were obtained from the base of the skull through the vertex without intravenous contrast. RADIATION DOSE REDUCTION: This exam was performed according to the departmental dose-optimization program which includes automated exposure control, adjustment of the mA and/or kV according to patient size and/or use of iterative reconstruction technique. COMPARISON:  01/16/2022 FINDINGS: Brain: No acute intracranial findings are seen. There are no signs of bleeding within the cranium. Calcifications are seen in basal ganglia. Cortical sulci are prominent. There is decreased density in periventricular white matter. Vascular: Unremarkable. Skull: No fracture is seen in calvarium. Sinuses/Orbits: Unremarkable. Other: None. IMPRESSION: No  acute intracranial findings are seen. Atrophy. Small-vessel disease. Electronically Signed   By: Elmer Picker M.D.   On: 06/27/2022 16:18   CT Cervical Spine Wo Contrast  Result Date: 06/27/2022 CLINICAL DATA:  Provided history: Neck trauma. Additional history provided: Fall. EXAM: CT CERVICAL SPINE WITHOUT CONTRAST TECHNIQUE: Multidetector CT imaging of the cervical spine was performed without intravenous contrast. Multiplanar CT image reconstructions were also generated. RADIATION DOSE REDUCTION: This exam was performed according to the departmental dose-optimization program which includes automated exposure control, adjustment of the mA and/or kV according to patient size and/or use of iterative reconstruction technique. COMPARISON:  None.  FINDINGS: Mildly motion degraded exam. Alignment: No significant spondylolisthesis. Skull base and vertebrae: The basion-dental and atlanto-dental intervals are maintained.No evidence of acute fracture to the cervical spine. Soft tissues and spinal canal: No prevertebral fluid or swelling. No visible canal hematoma. Disc levels: Cervical spondylosis with multilevel disc space narrowing and disc bulges/central disc protrusions. No appreciable high-grade spinal canal stenosis. No significant bony neural foraminal narrowing. Upper chest: No consolidation within the imaged lung apices. No visible pneumothorax. Other: Left mastoid effusion.  Aortic atherosclerosis. IMPRESSION: 1. Mildly motion degraded exam. 2. No evidence of acute fracture to the cervical spine. 3. Cervical spondylosis, as described. 4.  Aortic Atherosclerosis (ICD10-I70.0). Electronically Signed   By: Kellie Simmering D.O.   On: 06/27/2022 14:19    Assessment/Plan: 1 Day Post-Op   Principal Problem:   Closed right hip fracture (HCC) Active Problems:   Chronic kidney disease (CKD) stage G2/A1, mildly decreased glomerular filtration rate (GFR) between 60-89 mL/min/1.73 square meter and albuminuria creatinine ratio less than 30 mg/g   Essential hypertension   Dementia without behavioral disturbance (HCC)   Hyperlipidemia   Hypothyroidism   Protein-calorie malnutrition, severe  Patient is stable postop.  Her hemoglobin is 9.5 today.  Will continue physical therapy.  She is weightbearing as tolerated to right lower extremity with posterior hip precautions.  Patient will begin Lovenox today for DVT prophylaxis.  Patient will require skilled nursing facility upon discharge.  Discussed case with Dr. Reesa Chew.  Patient will be ordered for ceftriaxone while urine cultures are pending.  Patient finished post op cefazolin doses.  She is afebrile and WBC is 6.4 today.    Thornton Park , MD 06/29/2022, 1:50 PM

## 2022-06-29 NOTE — Evaluation (Signed)
Occupational Therapy Evaluation Patient Details Name: Angel Cameron MRN: [id number] DOB: December 26, 1940 Today's Date: 06/29/2022   History of Present Illness Pt is a 82 y.o. female s/p R hip hemiarthroplasty 06/28/22 afte mechanical fall.   Clinical Impression   Angel Cameron presents with generalized weakness, limited endurance, and pain. She comes to [facility] from [facility] memory care ALF and has significant cognitive impairment at baseline. She is unable to provide any information re: her PLOF, although anticipate that she was ambulatory and able to complete basic ADL with Mod I, as is required for [facility] residents. Pt is largely non-verbal today; she is able to indicate she has pain by pointing, guarding at surgical site, crying out with movement. Pt requires Mod-Max A for bed mobility and sit<>stand, with Max verbal and tactile cues for safe use of RW. She demonstrates strong posterior lean in both sitting and standing. Pt is able to follow directions to extend b/l knees while sitting EOB. On 2L O2, pt's O2 sats >94% throughout session. Anticipate that it will be very difficult for pt to recall posterior hip precautions. Pt left with abduction wedge in place and with mitts applied, as RN reports pt had been pulling at IV and other tubes. Recommend continued OT while hospitalized, with DC to SNF to assist pt in improving her fxl mobility, enabling her to return to [facility].     Recommendations for follow up therapy are one component of a multi-disciplinary discharge planning process, led by the attending physician.  Recommendations may be updated based on patient status, additional functional criteria and insurance authorization.   Follow Up Recommendations  Skilled nursing-short term rehab (<3 hours/day)     Assistance Recommended at Discharge Frequent or constant Supervision/Assistance  Patient can return home with the following A lot of help with walking and/or transfers;A lot of  help with bathing/dressing/bathroom    Functional Status Assessment  Patient has had a recent decline in their functional status and demonstrates the ability to make significant improvements in function in a reasonable and predictable amount of time.  Equipment Recommendations  None recommended by OT    Recommendations for Other Services       Precautions / Restrictions Precautions Precautions: Posterior Hip Precaution Booklet Issued: No Precaution Comments: hx of dementia Restrictions Weight Bearing Restrictions: Yes RLE Weight Bearing: Weight bearing as tolerated Other Position/Activity Restrictions: posterior hip precautions      Mobility Bed Mobility Overal bed mobility: Needs Assistance Bed Mobility: Supine to Sit, Sit to Supine     Supine to sit: Max assist, +2 for physical assistance, HOB elevated Sit to supine: Max assist   General bed mobility comments: assist at torso and LE's throughout. Unable to follow multimodal cuing for use of bed rails with UE's.    Transfers Overall transfer level: Needs assistance Equipment used: Rolling walker (2 wheels) Transfers: Sit to/from Stand Sit to Stand: Mod assist, +2 physical assistance           General transfer comment: hand over hand cuing. Pt initiates wanting to stand without RW.      Balance Overall balance assessment: Needs assistance Sitting-balance support: Bilateral upper extremity supported, Feet supported Sitting balance-Leahy Scale: Poor Sitting balance - Comments: Heavy posterior bias needing physical assist at torso. With time and UE/LE placement, transitions to fair sitting with close SBA. Postural control: Posterior lean Standing balance support: Bilateral upper extremity supported, During functional activity, Reliant on assistive device for balance Standing balance-Leahy Scale: Poor Standing balance  comment: POsterior bias in standing, difficulty WB on RLE. Heavy UE use, torso flexed over RW.                            ADL either performed or assessed with clinical judgement   ADL                                               Vision         Perception     Praxis      Pertinent Vitals/Pain Pain Assessment Faces Pain Scale: Hurts little more Pain Location: R hip Pain Descriptors / Indicators: Grimacing, Guarding Pain Intervention(s): Limited activity within patient's tolerance, Repositioned, Ice applied     Hand Dominance     Extremity/Trunk Assessment Upper Extremity Assessment Upper Extremity Assessment: Generalized weakness   Lower Extremity Assessment Lower Extremity Assessment: Generalized weakness;RLE deficits/detail RLE Deficits / Details: R hip hemiarthroplasty       Communication Communication Communication: Expressive difficulties   Cognition Arousal/Alertness: Lethargic Behavior During Therapy: Flat affect Overall Cognitive Status: History of cognitive impairments - at baseline                                 General Comments: history of dementia. Cooperative with multimodal cuing, largely nonverbal     General Comments  SPO2 > 90% throughout mobility.    Exercises     Shoulder Instructions      Home Living Family/patient expects to be discharged to:: Other (Comment)                                 Additional Comments: From [place] unit      Prior Functioning/Environment               Mobility Comments: Pt unable to provide meaningful hsitory due to dementia. Anticipate pt ambulatory as she resides in memory care unit          OT Problem List: Decreased strength;Decreased range of motion;Decreased activity tolerance;Impaired balance (sitting and/or standing);Pain      OT Treatment/Interventions: Self-care/ADL training;Therapeutic exercise;Patient/family education;Balance training;Energy conservation;Therapeutic activities;DME and/or AE instruction     OT Goals(Current goals can be found in the care plan section) Acute Rehab OT Goals Patient Stated Goal: pt unable to identify goal OT Goal Formulation: With patient Time For Goal Achievement: 07/13/22 Potential to Achieve Goals: Good ADL Goals Pt Will Perform Grooming: with min assist;sitting Pt Will Perform Upper Body Dressing: with supervision;sitting Pt Will Transfer to Toilet: with min assist;ambulating  OT Frequency: Min 2X/week    Co-evaluation              AM-PAC OT "6 Clicks" Daily Activity     Outcome Measure Help from another person eating meals?: A Little Help from another person taking care of personal grooming?: A Little Help from another person toileting, which includes using toliet, bedpan, or urinal?: A Lot Help from another person bathing (including washing, rinsing, drying)?: A Lot Help from another person to put on and taking off regular upper body clothing?: A Little Help from another person to put on and taking off regular lower body clothing?: Total 6 Click Score:  14   End of Session Equipment Utilized During Treatment: Rolling walker (2 wheels) Nurse Communication: Mobility status;Other (comment) (need for assist with eating)  Activity Tolerance: Patient tolerated treatment well Patient left: in bed;with call bell/phone within reach;with restraints reapplied;with bed alarm set  OT Visit Diagnosis: Unsteadiness on feet (R26.81);Muscle weakness (generalized) (M62.81);Pain;Other abnormalities of gait and mobility (R26.89) Pain - Right/Left: Right Pain - part of body: Hip                Time: 5176-1607 OT Time Calculation (min): 22 min Charges:  OT Evaluation $OT Eval Low Complexity: 1 Low OT Treatments $Self Care/Home Management : 8-22 mins Latina Craver, PhD, MS, OTR/L 06/29/22, 11:39 AM

## 2022-06-29 NOTE — TOC Progression Note (Addendum)
Transition of Care [facility]) - Progression Note    Patient Details  Name: Angel Cameron MRN: [id number] Date of Birth: [birth year]  Transition of Care [facility]) CM/SW [place], RN Phone Number: 06/29/2022, 11:35 AM  Clinical Narrative:     Attempted to reach the patient's husband however the number is disconnected, I called her daughter Freda Munro and she is agreeable for the patient to go to [facility] SNF, I explained the bedsearch process and that we would call to review the bed offers once obtained, she stated understanding and agreeance  PASSR is pending Bedsearch sent  Expected Discharge Plan: Skilled Nursing Facility Barriers to Discharge: Continued Medical Work up, SNF Pending bed offer, Ship broker  Expected Discharge Plan and [place] arrangements for the past 2 months: [place] ([place] care)                                       Social Determinants of Health (SDOH) Interventions SDOH Screenings   Tobacco Use: Low Risk  (06/29/2022)    Readmission Risk Interventions     No data to display

## 2022-06-29 NOTE — Evaluation (Signed)
Physical Therapy Evaluation Patient Details Name: Angel Cameron MRN: [id number] DOB: Jul 21, 1940 Today's Date: 06/29/2022  History of Present Illness  Pt is a 82 y.o. female s/p R hip hemiarthroplasty 06/28/22 afte mechanical fall.  Clinical Impression  Pt admitted with above diagnosis. Pt received semi reclined in bed, alert but appearing lethargic. RN at bedside reporting pt has had pain meds. Pt at baseline has a hx of dementia and unable to provided subjective reports but per EMR pt from [facility] memory care unit. Anticipate pt is ambulatory based off of living in memory care at her baseline. Unsure of her current needs for ADL's/IADL's. Pt unable to understand/comprehend posterior hip precautions due to her dementia and is reliant on mod to max multimodal cuing throughout session with PT/OT co-eval. Pt is participatory assisting with mobilizing her torso and UE's but reliant on maxA+2 and bed features to sit EOB. Initially heavy posterior bias in sitting that is reliant on modA+1 to maintain safe sitting balance but after manual facilitation of UE's/LE's pt able to maintain fair, upright sitting balance with close supervision. Pt impulsively trying to stand in sitting needing re-direction. With RW pt able to stand with modA+2 but limited WB tolerance on RLE and heavy UE support and flexed torso on RW. Pt unable to assist in L sided side steps to [facility] and repositioned in sitting then supine maxA+2 upright in bed. Pt clearly fatigued with increased WOB, keeping eyes closed. SPO2 on 2 L/min after STS > 90%. Breathing stabilizes with supine rest. Abduction wedge re-donned and mitts applied and all needs in reach. NT updated on mobility efforts. Pt currently with functional limitations due to the deficits listed below (see PT Problem List). Pt will benefit from skilled PT to increase their independence and safety with mobility to allow discharge to the venue listed below.      Recommendations for  follow up therapy are one component of a multi-disciplinary discharge planning process, led by the attending physician.  Recommendations may be updated based on patient status, additional functional criteria and insurance authorization.  Follow Up Recommendations Skilled nursing-short term rehab (<3 hours/day) Can patient physically be transported by private vehicle: No    Assistance Recommended at Discharge Frequent or constant Supervision/Assistance  Patient can return home with the following  Two people to help with walking and/or transfers;Two people to help with bathing/dressing/bathroom;Direct supervision/assist for medications management;Help with stairs or ramp for entrance;Assist for transportation;Assistance with feeding    Equipment Recommendations Other (comment) (TBD by next venue of care.)  Recommendations for Other Services       Functional Status Assessment Patient has had a recent decline in their functional status and demonstrates the ability to make significant improvements in function in a reasonable and predictable amount of time.     Precautions / Restrictions Precautions Precautions: Posterior Hip Precaution Booklet Issued: No Precaution Comments: hx of dementia Restrictions Weight Bearing Restrictions: Yes RLE Weight Bearing: Weight bearing as tolerated      Mobility  Bed Mobility Overal bed mobility: Needs Assistance Bed Mobility: Supine to Sit, Sit to Supine     Supine to sit: Max assist, +2 for physical assistance, HOB elevated Sit to supine: Max assist   General bed mobility comments: assist at torso and LE's throughout. Unable to follow multimodal cuing for use of bed rails with UE's. Patient Response: Cooperative, Flat affect  Transfers Overall transfer level: Needs assistance Equipment used: Rolling walker (2 wheels) Transfers: Sit to/from Stand Sit to Stand: Mod  assist, +2 physical assistance           General transfer comment: hand  over hand cuing. Pt initiates wanting to stand without RW.    Ambulation/Gait               General Gait Details: Deferred due to difficulty standing  Stairs            Wheelchair Mobility    Modified Rankin (Stroke Patients Only)       Balance Overall balance assessment: Needs assistance Sitting-balance support: Bilateral upper extremity supported, Feet supported Sitting balance-Leahy Scale: Poor Sitting balance - Comments: Heavy posterior bias needing physical assist at torso. With time and UE/LE placement, transitions to fair sitting with close SBA. Postural control: Posterior lean Standing balance support: Bilateral upper extremity supported, During functional activity, Reliant on assistive device for balance Standing balance-Leahy Scale: Poor Standing balance comment: POsterior bias in standing, difficulty WB on RLE. Heavy UE use, torso flexed over RW.                             Pertinent Vitals/Pain Pain Assessment Pain Assessment: Faces Faces Pain Scale: Hurts little more Pain Location: R hip Pain Descriptors / Indicators: Grimacing, Guarding Pain Intervention(s): Limited activity within patient's tolerance, Monitored during session, Premedicated before session, Repositioned, Ice applied    Home Living Family/patient expects to be discharged to:: Other (Comment) (Per [place] memory care unit)                   Additional Comments: From [place] unit    Prior Function               Mobility Comments: Pt unable to provide meaningful hsitory due to dementia. Anticipate pt ambulatory as she resides in memory care unit       Hand Dominance        Extremity/Trunk Assessment   Upper Extremity Assessment Upper Extremity Assessment: Defer to OT evaluation    Lower Extremity Assessment Lower Extremity Assessment: Generalized weakness;RLE deficits/detail RLE Deficits / Details: R hip  hemiarthroplasty       Communication      Cognition Arousal/Alertness: Lethargic Behavior During Therapy: Flat affect Overall Cognitive Status: History of cognitive impairments - at baseline                                 General Comments: history of dementia. Cooperative with multimodal cuing.        General Comments General comments (skin integrity, edema, etc.): SPO2 > 90% throughout mobility.    Exercises General Exercises - Lower Extremity Long Arc Quad: AROM, Strengthening, Both, 10 reps, Seated Hip ABduction/ADduction: Right, PROM, Supine, 5 reps Other Exercises Other Exercises: Role of PT in acute care, assisted in OOB mobility and bed mobility. Assisted in some LE therex   Assessment/Plan    PT Assessment Patient needs continued PT services  PT Problem List Decreased strength;Pain;Decreased range of motion;Decreased cognition;Decreased activity tolerance;Decreased balance;Decreased safety awareness;Decreased mobility;Decreased knowledge of precautions       PT Treatment Interventions DME instruction;Therapeutic exercise;Gait training;Balance training;Stair training;Neuromuscular re-education;Functional mobility training;Cognitive remediation;Therapeutic activities;Patient/family education    PT Goals (Current goals can be found in the Care Plan section)  Acute Rehab PT Goals PT Goal Formulation: Patient unable to participate in goal setting    Frequency 7X/week  Co-evaluation               AM-PAC PT "6 Clicks" Mobility  Outcome Measure Help needed turning from your back to your side while in a flat bed without using bedrails?: A Lot Help needed moving from lying on your back to sitting on the side of a flat bed without using bedrails?: A Lot Help needed moving to and from a bed to a chair (including a wheelchair)?: Total Help needed standing up from a chair using your arms (e.g., wheelchair or bedside chair)?: A Lot Help needed to  walk in hospital room?: Total Help needed climbing 3-5 steps with a railing? : Total 6 Click Score: 9    End of Session Equipment Utilized During Treatment: Gait belt;Oxygen Activity Tolerance: Patient tolerated treatment well Patient left: in bed;with call bell/phone within reach;with bed alarm set;Other (comment) (abduction wedge in place) Nurse Communication: Mobility status PT Visit Diagnosis: Other abnormalities of gait and mobility (R26.89);Muscle weakness (generalized) (M62.81);History of falling (Z91.81);Difficulty in walking, not elsewhere classified (R26.2);Pain Pain - Right/Left: Right Pain - part of body: Hip    Time: 2979-8921 PT Time Calculation (min) (ACUTE ONLY): 20 min   Charges:   PT Evaluation $PT Eval Moderate Complexity: 1 Mod         Kenleigh Toback M. Fairly IV, PT, DPT Physical Therapist- [facility]  06/29/2022, 10:17 AM

## 2022-06-29 NOTE — Anesthesia Postprocedure Evaluation (Signed)
Anesthesia Post Note  Patient: Angel Cameron  Procedure(s) Performed: ARTHROPLASTY BIPOLAR HIP (HEMIARTHROPLASTY) (Right: Hip)  Patient location during evaluation: Nursing Unit Anesthesia Type: Spinal Respiratory status: spontaneous breathing Cardiovascular status: stable Anesthetic complications: no   There were no known notable events for this encounter.   Last Vitals:  Vitals:   06/29/22 0412 06/29/22 0802  BP: (!) 117/53 108/60  Pulse: 67 72  Resp: 18 16  Temp: 36.7 C 36.5 C  SpO2: 96% 99%    Last Pain:  Vitals:   06/29/22 0412  TempSrc: Oral  PainSc:                  Lerry Liner

## 2022-06-30 DIAGNOSIS — N390 Urinary tract infection, site not specified: Secondary | ICD-10-CM

## 2022-06-30 DIAGNOSIS — S72001A Fracture of unspecified part of neck of right femur, initial encounter for closed fracture: Secondary | ICD-10-CM | POA: Diagnosis not present

## 2022-06-30 LAB — BASIC METABOLIC PANEL
Anion gap: 8 (ref 5–15)
BUN: 33 mg/dL — ABNORMAL HIGH (ref 8–23)
CO2: 26 mmol/L (ref 22–32)
Calcium: 8.3 mg/dL — ABNORMAL LOW (ref 8.9–10.3)
Chloride: 104 mmol/L (ref 98–111)
Creatinine, Ser: 1.08 mg/dL — ABNORMAL HIGH (ref 0.44–1.00)
GFR, Estimated: 51 mL/min — ABNORMAL LOW (ref >60)
Glucose, Bld: 150 mg/dL — ABNORMAL HIGH (ref 70–99)
Potassium: 4 mmol/L (ref 3.5–5.1)
Sodium: 138 mmol/L (ref 135–145)

## 2022-06-30 LAB — CBC
HCT: 26.5 % — ABNORMAL LOW (ref 36.0–46.0)
Hemoglobin: 8.4 g/dL — ABNORMAL LOW (ref 12.0–15.0)
MCH: 27.2 pg (ref 26.0–34.0)
MCHC: 31.7 g/dL (ref 30.0–36.0)
MCV: 85.8 fL (ref 80.0–100.0)
Platelets: 190 10*3/uL (ref 150–400)
RBC: 3.09 MIL/uL — ABNORMAL LOW (ref 3.87–5.11)
RDW: 14.4 % (ref 11.5–15.5)
WBC: 5.4 10*3/uL (ref 4.0–10.5)
nRBC: 0 % (ref 0.0–0.2)

## 2022-06-30 LAB — URINE CULTURE: Culture: >=100000 — AB

## 2022-06-30 NOTE — Progress Notes (Signed)
Physical Therapy Treatment Patient Details Name: Angel Cameron MRN: [id number] DOB: [birth year] Today's Date: 06/30/2022   History of Present Illness Pt is a 82 y.o. female s/p R hip hemiarthroplasty 06/28/22 afte mechanical fall.    PT Comments    Pt progressing with bed mobility performing at min A level supine to sit but continued to require +2 Max assist  sit to supine at end of session d/t fatigue and R LE pain.  Pt progressed with sit <>stands with Mod A +1 but continues to be limited with R LE and fatigue and was unable to weight shift onto RLE sufficiently to take steps.  Pt's SPO2 on 2L  >90%; attempted on RA but dropped to 86%; resumed on 2LO2 to get SPO2 > 90%.  Current PT POC and d/c plan is appropriate.    Recommendations for follow up therapy are one component of a multi-disciplinary discharge planning process, led by the attending physician.  Recommendations may be updated based on patient status, additional functional criteria and insurance authorization.  Follow Up Recommendations  Skilled nursing-short term rehab (<3 hours/day) Can patient physically be transported by private vehicle: No   Assistance Recommended at Discharge Frequent or constant Supervision/Assistance  Patient can return home with the following Two people to help with walking and/or transfers;Two people to help with bathing/dressing/bathroom;Direct supervision/assist for medications management;Help with stairs or ramp for entrance;Assist for transportation;Assistance with feeding   Equipment Recommendations  Other (comment)    Recommendations for Other Services       Precautions / Restrictions Precautions Precautions: Posterior Hip Precaution Booklet Issued: No Precaution Comments: hx of dementia Restrictions Weight Bearing Restrictions: Yes Other Position/Activity Restrictions: posterior hip precautions     Mobility  Bed Mobility Overal bed mobility: Needs Assistance Bed Mobility: Supine  to Sit, Sit to Supine     Supine to sit: Min assist Sit to supine: +2 for physical assistance   General bed mobility comments: Pt able to follow commands for supine to sit but required increased time and encouragement to not lay back.  At the end of session, pt very fatigued and required assist at torso and LE's for sit to supine.    Transfers Overall transfer level: Needs assistance Equipment used: Rolling walker (2 wheels) Transfers: Sit to/from Stand Sit to Stand: Mod assist           General transfer comment: multiple reps EOB; pt had difficulty tolerating weightbearing due to pain in R LE.    Ambulation/Gait               General Gait Details: Deferred due to difficulty standing; attempted side stepping EOB but pt unable to sufficiently weight shift onto RLE.   Stairs             Wheelchair Mobility    Modified Rankin (Stroke Patients Only)       Balance Overall balance assessment: Needs assistance Sitting-balance support: Bilateral upper extremity supported, Feet supported Sitting balance-Leahy Scale: Fair Sitting balance - Comments: Able to correct posterior lean with multimodal cues and min A; transitions to fair sitting with close SBA. Postural control: Posterior lean Standing balance support: Bilateral upper extremity supported, During functional activity, Reliant on assistive device for balance Standing balance-Leahy Scale: Poor Standing balance comment: POsterior bias in standing, difficulty WB on RLE. Heavy UE use, torso flexed over RW.  Cognition Arousal/Alertness: Lethargic Behavior During Therapy: Flat affect Overall Cognitive Status: History of cognitive impairments - at baseline                                 General Comments: history of dementia. Cooperative with multimodal cuing, largely nonverbal        Exercises      General Comments        Pertinent Vitals/Pain Pain  Assessment Pain Assessment: Faces Faces Pain Scale: Hurts even more Pain Descriptors / Indicators: Grimacing, Guarding Pain Intervention(s): Limited activity within patient's tolerance, Monitored during session    Home Living                          Prior Function            PT Goals (current goals can now be found in the care plan section) Acute Rehab PT Goals PT Goal Formulation: Patient unable to participate in goal setting Progress towards PT goals: Progressing toward goals    Frequency    7X/week      PT Plan Current plan remains appropriate    Co-evaluation              AM-PAC PT "6 Clicks" Mobility   Outcome Measure  Help needed turning from your back to your side while in a flat bed without using bedrails?: A Lot Help needed moving from lying on your back to sitting on the side of a flat bed without using bedrails?: A Little Help needed moving to and from a bed to a chair (including a wheelchair)?: Total Help needed standing up from a chair using your arms (e.g., wheelchair or bedside chair)?: A Lot Help needed to walk in hospital room?: Total Help needed climbing 3-5 steps with a railing? : Total 6 Click Score: 10    End of Session Equipment Utilized During Treatment: Gait belt;Oxygen Activity Tolerance: Patient tolerated treatment well Patient left: in bed;with call bell/phone within reach;with nursing/sitter in room (with mitts redonned and abd wedge in place)   PT Visit Diagnosis: Other abnormalities of gait and mobility (R26.89);Muscle weakness (generalized) (M62.81);History of falling (Z91.81);Difficulty in walking, not elsewhere classified (R26.2);Pain Pain - Right/Left: Right Pain - part of body: Hip     Time: 3875-6433 PT Time Calculation (min) (ACUTE ONLY): 25 min  Charges:  $Therapeutic Activity: 23-37 mins                     Bjorn Loser, PTA  06/30/22, 12:14 PM

## 2022-06-30 NOTE — Progress Notes (Signed)
Progress Note   Patient: Angel Cameron:[id number] DOB: [birth year] DOA: 06/27/2022     3 DOS: the patient was seen and examined on 06/30/2022   Brief hospital course: [name] is a 82 year old female with dementia, hypertension, hyperlipidemia, hypothyroid, GERD, who presents emergency department for chief concerns of mechanical fall and right hip pain.  Initial vitals in the ED showed temperature of 98, respiration rate of 16, heart rate of 108, blood pressure 141/85, SpO2 of 91% on room air.  Patient was noted to be sleeping and that her oxygen saturations dropped to the 80s and patient was placed on 2 L nasal cannula with appropriate function saturation improvement.  Serum sodium is 136, potassium 4.1, chloride 99, bicarb 26, BUN of 24, serum creatinine of 1.02, EGFR is 55, nonfasting blood glucose 181, WBC 5.8, hemoglobin 11, platelets of 155.  CT of the head without contrast and CT cervical spine without contrast: No acute injury or fracture.  Right hip x-ray 2-3 views: Displaced, noncommitted varus angulation right femoral neck fracture  ED treatment: Acetaminophen 1000 mg p.o. one-time dose, morphine 2 mg IV one-time dose, ondansetron 4 mg IV.  EDP consulted orthopedic service, Dr. Mack Guise who states he will see the patient.  1/11: Vitals and labs stable.  Respiratory panel negative.  Going to OR with orthopedic surgery later today.  1/12: Vitals stable.  S/p right hip hemiarthroplasty, hemoglobin decreased to 9.5 postsurgically.  BMP seems stable with very mild worsening of creatinine to 1.13 but remained within baseline. PT/OT are recommending SNF.  1/13: Patient became febrile at 101, urine cultures with pansensitive E. coli.  Patient was placed on ceftriaxone on 1/12.  Hemoglobin with further decreased to 8.4, creatinine improving.   Assessment and Plan: * Closed right hip fracture ([place]) Secondary to mechanical fall, s/p hemiarthroplasty.  Tolerated the  procedure well PT/OT are recommending SNF -Continue with pain management -Fall precautions -TOC to work on placement  UTI (urinary tract infection) Urine cultures with pansensitive E. coli.  Patient became febrile this morning. Concern of mild dysuria. -Continue with ceftriaxone-we can switch to Keflex after swallow team evaluation  Essential hypertension - Benazepril 20 mg p.o. twice daily metoprolol succinate 50 mg p.o. daily were resumed  Chronic kidney disease (CKD) stage G2/A1, mildly decreased glomerular filtration rate (GFR) between 60-89 mL/min/1.73 square meter and albuminuria creatinine ratio less than 30 mg/g - Appears to be at baseline  Hypothyroidism - Levothyroxine 75 mcg daily resumed  Dementia without behavioral disturbance (HCC) - Resumed memantine 5 mg daily, donepezil 5 milligrams nightly  Hyperlipidemia - Atorvastatin 10 mg daily resumed  Protein-calorie malnutrition, severe Estimated body mass index is 24.51 kg/m as calculated from the following:   Height as of this encounter: 5\' 2"  (1.575 m).   Weight as of this encounter: 60.8 kg.   -Dietitian consult       Subjective: Patient was lying down in bed and nodding yes to pain but unable to explain where the pain is.  Physical Exam: Vitals:   06/29/22 1650 06/30/22 0012 06/30/22 0613 06/30/22 0759  BP: (!) 106/53 94/81 (!) 120/54 (!) 131/48  Pulse: 80 100 85 97  Resp: 16 20 16 16   Temp: 99.6 F (37.6 C) 98.2 F (36.8 C) 98.1 F (36.7 C) (!) 101 F (38.3 C)  TempSrc: Oral  Oral   SpO2: 93% 99% 91% 97%  Weight:      Height:       General.  Frail elderly lady, in  no acute distress. Pulmonary.  Lungs clear bilaterally, normal respiratory effort. CV.  Regular rate and rhythm, no JVD, rub or murmur. Abdomen.  Soft, nontender, nondistended, BS positive. CNS.  Alert and oriented .  No focal neurologic deficit. Extremities.  No edema, no cyanosis, pulses intact and symmetrical. Psychiatry.   Judgment and insight appears impaired.    Data Reviewed: Prior data reviewed  Family Communication: Discussed with daughter on phone  Disposition: Status is: Inpatient Remains inpatient appropriate because: Severity of illness  Planned Discharge Destination: Skilled nursing facility  DVT prophylaxis.  Lovenox Time spent: 41 minutes  This record has been created using Systems analyst. Errors have been sought and corrected,but may not always be located. Such creation errors do not reflect on the standard of care.   Author: Lorella Nimrod, MD 06/30/2022 2:28 PM  For on call review [email or web address].

## 2022-06-30 NOTE — Assessment & Plan Note (Signed)
Urine cultures with pansensitive E. coli.  Patient became febrile this morning. Concern of mild dysuria. -Switch her to Keflex to complete a 5-day course

## 2022-06-30 NOTE — Progress Notes (Signed)
Subjective:  POD #2 s/p right hip hemiarthroplasty.   Patient with advanced dementia.  She is unable to provide a history.  Patient is wearing mitts.  She is resting comfortably in bed but is picking at her bed sheets.  Patient being treated for gram - rod UTI.  Sensitivities are pending.  Patient on ceftriaxone.  Afebrile.  Hgb 8.4.  Objective:   VITALS:   Vitals:   06/29/22 1650 06/30/22 0012 06/30/22 0613 06/30/22 0759  BP: (!) 106/53 94/81 (!) 120/54 (!) 131/48  Pulse: 80 100 85 97  Resp: 16 20 16 16   Temp: 99.6 F (37.6 C) 98.2 F (36.8 C) 98.1 F (36.7 C) (!) 101 F (38.3 C)  TempSrc: Oral  Oral   SpO2: 93% 99% 91% 97%  Weight:      Height:        PHYSICAL EXAM: Right lower extremity: Patient unable to participate with the physical exam due to her dementia.  Unable to assess the patient's sensation. Patient has spontaneous flexion extension of her toes and dorsiflexion and plantarflexion of her ankle. Intact pulses distally Incision: dressing C/D/I No cellulitis present Compartment soft  LABS  Results for orders placed or performed during the hospital encounter of 06/27/22 (from the past 24 hour(s))  CBC     Status: Abnormal   Collection Time: 06/30/22  6:14 AM  Result Value Ref Range   WBC 5.4 4.0 - 10.5 K/uL   RBC 3.09 (L) 3.87 - 5.11 MIL/uL   Hemoglobin 8.4 (L) 12.0 - 15.0 g/dL   HCT 26.5 (L) 36.0 - 46.0 %   MCV 85.8 80.0 - 100.0 fL   MCH 27.2 26.0 - 34.0 pg   MCHC 31.7 30.0 - 36.0 g/dL   RDW 14.4 11.5 - 15.5 %   Platelets 190 150 - 400 K/uL   nRBC 0.0 0.0 - 0.2 %  Basic metabolic panel     Status: Abnormal   Collection Time: 06/30/22  6:14 AM  Result Value Ref Range   Sodium 138 135 - 145 mmol/L   Potassium 4.0 3.5 - 5.1 mmol/L   Chloride 104 98 - 111 mmol/L   CO2 26 22 - 32 mmol/L   Glucose, Bld 150 (H) 70 - 99 mg/dL   BUN 33 (H) 8 - 23 mg/dL   Creatinine, Ser 1.08 (H) 0.44 - 1.00 mg/dL   Calcium 8.3 (L) 8.9 - 10.3 mg/dL   GFR, Estimated 51 (L)  >60 mL/min   Anion gap 8 5 - 15    DG Hip Port Unilat With Pelvis 1V Right  Result Date: 06/28/2022 CLINICAL DATA:  Status post right total hip replacement. EXAM: DG HIP (WITH OR WITHOUT PELVIS) 1V PORT RIGHT COMPARISON:  June 27, 2022. FINDINGS: Status post right hip hemiarthroplasty. Expected postoperative changes are seen in the surrounding soft tissues. IMPRESSION: Status post right hip hemiarthroplasty. Electronically Signed   By: Marijo Conception M.D.   On: 06/28/2022 15:56    Assessment/Plan: 2 Days Post-Op   Principal Problem:   Closed right hip fracture (HCC) Active Problems:   Chronic kidney disease (CKD) stage G2/A1, mildly decreased glomerular filtration rate (GFR) between 60-89 mL/min/1.73 square meter and albuminuria creatinine ratio less than 30 mg/g   Essential hypertension   Dementia without behavioral disturbance (HCC)   Hyperlipidemia   Hypothyroidism   Protein-calorie malnutrition, severe  Continue physical therapy.  She is weightbearing as tolerated to right lower extremity with posterior hip precautions.  Continue Lovenox for DVT  prophylaxis.  Patient will require skilled nursing facility upon discharge. Continue Ceftriaxone.  Follow culture sensitivities.      Thornton Park , MD 06/30/2022, 9:52 AM

## 2022-07-01 ENCOUNTER — Inpatient Hospital Stay: Payer: Medicare HMO

## 2022-07-01 DIAGNOSIS — S72001A Fracture of unspecified part of neck of right femur, initial encounter for closed fracture: Secondary | ICD-10-CM | POA: Diagnosis not present

## 2022-07-01 LAB — CBC
HCT: 26.9 % — ABNORMAL LOW (ref 36.0–46.0)
Hemoglobin: 8.4 g/dL — ABNORMAL LOW (ref 12.0–15.0)
MCH: 27.5 pg (ref 26.0–34.0)
MCHC: 31.2 g/dL (ref 30.0–36.0)
MCV: 87.9 fL (ref 80.0–100.0)
Platelets: 183 10*3/uL (ref 150–400)
RBC: 3.06 MIL/uL — ABNORMAL LOW (ref 3.87–5.11)
RDW: 14.3 % (ref 11.5–15.5)
WBC: 5.7 10*3/uL (ref 4.0–10.5)
nRBC: 0 % (ref 0.0–0.2)

## 2022-07-01 MED ORDER — CEPHALEXIN 500 MG PO CAPS
500.0000 mg | ORAL_CAPSULE | Freq: Two times a day (BID) | ORAL | Status: AC
  Administered 2022-07-01 – 2022-07-02 (×4): 500 mg via ORAL
  Filled 2022-07-01 (×4): qty 1

## 2022-07-01 NOTE — Progress Notes (Signed)
  Subjective:  POD #3 s/p right hip hemiarthroplasty.   Patient with advanced dementia.  She is unable to provide a history.  Patient is wearing mitts.  She is resting comfortably in bed but is to remove her mitts.  Patient on ceftriaxone for pansensitive E. coli UTI.  Hgb stable.  Objective:   VITALS:   Vitals:   06/30/22 0613 06/30/22 0759 06/30/22 1707 06/30/22 2349  BP: (!) 120/54 (!) 131/48 128/61 (!) 134/49  Pulse: 85 97 99 84  Resp: 16 16 16 16   Temp: 98.1 F (36.7 C) (!) 101 F (38.3 C) 99.6 F (37.6 C) 98 F (36.7 C)  TempSrc: Oral   Oral  SpO2: 91% 97% 96% 95%  Weight:      Height:        PHYSICAL EXAM: Right lower extremity: Patient unable to participate with the physical exam due to her dementia.  Unable to assess the patient's sensation. Patient has spontaneous flexion extension of her toes and dorsiflexion and plantarflexion of her ankle. Intact pulses distally Incision: dressing C/D/I No cellulitis present Compartment soft  LABS  Results for orders placed or performed during the hospital encounter of 06/27/22 (from the past 24 hour(s))  CBC     Status: Abnormal   Collection Time: 07/01/22  6:24 AM  Result Value Ref Range   WBC 5.7 4.0 - 10.5 K/uL   RBC 3.06 (L) 3.87 - 5.11 MIL/uL   Hemoglobin 8.4 (L) 12.0 - 15.0 g/dL   HCT 26.9 (L) 36.0 - 46.0 %   MCV 87.9 80.0 - 100.0 fL   MCH 27.5 26.0 - 34.0 pg   MCHC 31.2 30.0 - 36.0 g/dL   RDW 14.3 11.5 - 15.5 %   Platelets 183 150 - 400 K/uL   nRBC 0.0 0.0 - 0.2 %    No results found.  Assessment/Plan: 3 Days Post-Op   Principal Problem:   Closed right hip fracture (HCC) Active Problems:   Chronic kidney disease (CKD) stage G2/A1, mildly decreased glomerular filtration rate (GFR) between 60-89 mL/min/1.73 square meter and albuminuria creatinine ratio less than 30 mg/g   Essential hypertension   Dementia without behavioral disturbance (HCC)   Hyperlipidemia   Hypothyroidism   Protein-calorie  malnutrition, severe   UTI (urinary tract infection)  Continue physical therapy.  She is weightbearing as tolerated to right lower extremity with posterior hip precautions.  Continue Lovenox for DVT prophylaxis.  Patient will require skilled nursing facility upon discharge. Continue Ceftriaxone.      Renee Harder , MD 07/01/2022, 7:46 AM

## 2022-07-01 NOTE — Progress Notes (Signed)
Physical Therapy Treatment Patient Details Name: Angel Cameron MRN: [id number] DOB: [birth year] Today's Date: 07/01/2022   History of Present Illness Pt is a 82 y.o. female s/p R hip hemiarthroplasty 06/28/22 afte mechanical fall.    PT Comments    Per RN, pt with poor sleep last night.  She is awake upon entering and talkative.  She is able to get to EOB with mod a x 1 and remain sitting x 8 minutes before self initiating return to supine.  Some seated AAROM but she has difficulty following cues despite physical assist.  Standing attempts x 3.  On each attempt she kicks her feet straight out despite physical cues and attempts to keep her feet on the floor.  She returns to supine with mod a x 1 and is repositioned for comfort.     Recommendations for follow up therapy are one component of a multi-disciplinary discharge planning process, led by the attending physician.  Recommendations may be updated based on patient status, additional functional criteria and insurance authorization.  Follow Up Recommendations  Skilled nursing-short term rehab (<3 hours/day)     Assistance Recommended at Discharge    Patient can return home with the following Two people to help with walking and/or transfers;Two people to help with bathing/dressing/bathroom;Direct supervision/assist for medications management;Help with stairs or ramp for entrance;Assist for transportation;Assistance with feeding   Equipment Recommendations       Recommendations for Other Services       Precautions / Restrictions Precautions Precautions: Posterior Hip Precaution Booklet Issued: No Precaution Comments: hx of dementia Restrictions Weight Bearing Restrictions: Yes RLE Weight Bearing: Weight bearing as tolerated Other Position/Activity Restrictions: posterior hip precautions     Mobility  Bed Mobility Overal bed mobility: Needs Assistance Bed Mobility: Supine to Sit, Sit to Supine     Supine to sit: Mod  assist Sit to supine: Mod assist        Transfers Overall transfer level: Needs assistance   Transfers: Sit to/from Stand Sit to Stand: Total assist           General transfer comment: puts feet straight out on each attempt to stand despite cues and physical assist to try to keep them on the floor    Ambulation/Gait                   Stairs             Wheelchair Mobility    Modified Rankin (Stroke Patients Only)       Balance Overall balance assessment: Needs assistance Sitting-balance support: Bilateral upper extremity supported, Feet supported Sitting balance-Leahy Scale: Fair Sitting balance - Comments: supervision for safety and occasional cues to sit upright due to post lean                                    Cognition Arousal/Alertness: Lethargic Behavior During Therapy: WFL for tasks assessed/performed Overall Cognitive Status: History of cognitive impairments - at baseline                                          Exercises Other Exercises Other Exercises: seated AAROM x 10    General Comments        Pertinent Vitals/Pain Pain Assessment Pain Assessment: Faces Faces Pain Scale: Hurts little more Pain  Location: R hip Pain Descriptors / Indicators: Grimacing, Guarding Pain Intervention(s): Limited activity within patient's tolerance, Monitored during session, Repositioned    Home Living                          Prior Function            PT Goals (current goals can now be found in the care plan section) Progress towards PT goals: Progressing toward goals    Frequency    7X/week      PT Plan Current plan remains appropriate    Co-evaluation              AM-PAC PT "6 Clicks" Mobility   Outcome Measure  Help needed turning from your back to your side while in a flat bed without using bedrails?: A Lot Help needed moving from lying on your back to sitting on the side of  a flat bed without using bedrails?: A Lot Help needed moving to and from a bed to a chair (including a wheelchair)?: Total Help needed standing up from a chair using your arms (e.g., wheelchair or bedside chair)?: Total Help needed to walk in hospital room?: Total Help needed climbing 3-5 steps with a railing? : Total 6 Click Score: 8    End of Session Equipment Utilized During Treatment: Gait belt;Oxygen Activity Tolerance: Patient tolerated treatment well Patient left: in bed;with call bell/phone within reach;with nursing/sitter in room Nurse Communication: Mobility status PT Visit Diagnosis: Other abnormalities of gait and mobility (R26.89);Muscle weakness (generalized) (M62.81);History of falling (Z91.81);Difficulty in walking, not elsewhere classified (R26.2);Pain Pain - Right/Left: Right Pain - part of body: Hip     Time: 0932-3557 PT Time Calculation (min) (ACUTE ONLY): 15 min  Charges:  $Therapeutic Activity: 8-22 mins                   Chesley Noon, PTA 07/01/22, 8:25 AM

## 2022-07-01 NOTE — Progress Notes (Signed)
Progress Note   Patient: Angel Cameron UVO:[id number] DOB: [birth year] DOA: 06/27/2022     4 DOS: the patient was seen and examined on 07/01/2022   Brief hospital course: Ms. Katana Berthold is a 82 year old female with dementia, hypertension, hyperlipidemia, hypothyroid, GERD, who presents emergency department for chief concerns of mechanical fall and right hip pain.  Initial vitals in the ED showed temperature of 98, respiration rate of 16, heart rate of 108, blood pressure 141/85, SpO2 of 91% on room air.  Patient was noted to be sleeping and that her oxygen saturations dropped to the 80s and patient was placed on 2 L nasal cannula with appropriate function saturation improvement.  Serum sodium is 136, potassium 4.1, chloride 99, bicarb 26, BUN of 24, serum creatinine of 1.02, EGFR is 55, nonfasting blood glucose 181, WBC 5.8, hemoglobin 11, platelets of 155.  CT of the head without contrast and CT cervical spine without contrast: No acute injury or fracture.  Right hip x-ray 2-3 views: Displaced, noncommitted varus angulation right femoral neck fracture  ED treatment: Acetaminophen 1000 mg p.o. one-time dose, morphine 2 mg IV one-time dose, ondansetron 4 mg IV.  EDP consulted orthopedic service, Dr. Mack Guise who states he will see the patient.  1/11: Vitals and labs stable.  Respiratory panel negative.  Going to OR with orthopedic surgery later today.  1/12: Vitals stable.  S/p right hip hemiarthroplasty, hemoglobin decreased to 9.5 postsurgically.  BMP seems stable with very mild worsening of creatinine to 1.13 but remained within baseline. PT/OT are recommending SNF.  1/13: Patient became febrile at 101, urine cultures with pansensitive E. coli.  Patient was placed on ceftriaxone on 1/12.  Hemoglobin with further decreased to 8.4, creatinine improving.  1/14: Patient remained afebrile after 1 incidence of being febrile yesterday morning.  Hemoglobin seems stable at 8.4.  Chest  x-ray was repeated after nursing complaint of new onset cough and it shows linear atelectasis at left lung base along with prominence of bilateral hilar region which may reflect underlying pulmonary hypertension. Switching her to Keflex as she is able to take p.o. now.  Will complete 5-day course for UTI Awaiting SNF placement   Assessment and Plan: * Closed right hip fracture ([place]) Secondary to mechanical fall, s/p hemiarthroplasty.  Tolerated the procedure well PT/OT are recommending SNF -Continue with pain management -Fall precautions -TOC to work on placement  UTI (urinary tract infection) Urine cultures with pansensitive E. coli.  Patient became febrile this morning. Concern of mild dysuria. -Switch her to Keflex to complete a 5-day course  Essential hypertension - Benazepril 20 mg p.o. twice daily metoprolol succinate 50 mg p.o. daily were resumed  Chronic kidney disease (CKD) stage G2/A1, mildly decreased glomerular filtration rate (GFR) between 60-89 mL/min/1.73 square meter and albuminuria creatinine ratio less than 30 mg/g - Appears to be at baseline  Hypothyroidism - Levothyroxine 75 mcg daily resumed  Dementia without behavioral disturbance (HCC) - Resumed memantine 5 mg daily, donepezil 5 milligrams nightly  Hyperlipidemia - Atorvastatin 10 mg daily resumed  Protein-calorie malnutrition, severe Estimated body mass index is 24.51 kg/m as calculated from the following:   Height as of this encounter: 5\' 2"  (1.575 m).   Weight as of this encounter: 60.8 kg.   -Dietitian consult       Subjective: Patient was seen and examined today.  She was feeling hungry and noted yes when I ask if she need any help with the feeding as mittens were on.  Notified nursing  staff  Physical Exam: Vitals:   06/30/22 0759 06/30/22 1707 06/30/22 2349 07/01/22 0802  BP: (!) 131/48 128/61 (!) 134/49 (!) 146/59  Pulse: 97 99 84 84  Resp: 16 16 16 16   Temp: (!) 101 F (38.3 C) 99.6  F (37.6 C) 98 F (36.7 C) 98.9 F (37.2 C)  TempSrc:   Oral   SpO2: 97% 96% 95% 92%  Weight:      Height:       General.  Frail and malnourished elderly lady, in no acute distress. Pulmonary.  Lungs clear bilaterally, normal respiratory effort. CV.  Regular rate and rhythm, no JVD, rub or murmur. Abdomen.  Soft, nontender, nondistended, BS positive. CNS.  Alert and oriented .  No focal neurologic deficit. Extremities.  No edema, no cyanosis, pulses intact and symmetrical. Psychiatry.  Judgment and insight appears impaired.  Data Reviewed: Prior data reviewed  Family Communication: Discussed with daughter on phone  Disposition: Status is: Inpatient Remains inpatient appropriate because: Severity of illness  Planned Discharge Destination: Skilled nursing facility  DVT prophylaxis.  Lovenox Time spent: 40 minutes  This record has been created using Systems analyst. Errors have been sought and corrected,but may not always be located. Such creation errors do not reflect on the standard of care.   Author: Lorella Nimrod, MD 07/01/2022 1:25 PM  For on call review [email or web address].

## 2022-07-01 NOTE — Plan of Care (Signed)

## 2022-07-02 DIAGNOSIS — S72001A Fracture of unspecified part of neck of right femur, initial encounter for closed fracture: Secondary | ICD-10-CM | POA: Diagnosis not present

## 2022-07-02 LAB — SURGICAL PATHOLOGY

## 2022-07-02 NOTE — Progress Notes (Signed)
Physical Therapy Treatment Patient Details Name: Angel Cameron MRN: [id number] DOB: Jun 16, 1941 Today's Date: 07/02/2022   History of Present Illness Pt is a 82 y.o. female s/p R hip hemiarthroplasty 06/28/22 afte mechanical fall.    PT Comments    Pt ready for session.  Awake and engaged today.  She is able to transition to EOB with mod a x 1.  Generally steady in sitting today but supervision for safety.  BLE AAROM in supine.  She is able to stand x 1 with RW with min a x 2 and post lean,  cues to correct which she does attempt but does use post legs on bed for support.  She is unable to step or march in place.  After short seated rest, she is able to stand pivot to recliner at bedside with mod a x 2 for support.  Remains in chair with OT in room for session.  Overall improved participation and tolerance for session today.  SNF remains appropriate.   Recommendations for follow up therapy are one component of a multi-disciplinary discharge planning process, led by the attending physician.  Recommendations may be updated based on patient status, additional functional criteria and insurance authorization.  Follow Up Recommendations  Skilled nursing-short term rehab (<3 hours/day)     Assistance Recommended at Discharge    Patient can return home with the following Two people to help with walking and/or transfers;Two people to help with bathing/dressing/bathroom;Direct supervision/assist for medications management;Help with stairs or ramp for entrance;Assist for transportation;Assistance with feeding   Equipment Recommendations  Other (comment)    Recommendations for Other Services       Precautions / Restrictions Precautions Precautions: Posterior Hip Precaution Booklet Issued: No Precaution Comments: hx of dementia Restrictions Weight Bearing Restrictions: Yes Other Position/Activity Restrictions: posterior hip precautions     Mobility  Bed Mobility Overal bed mobility:  Needs Assistance Bed Mobility: Supine to Sit     Supine to sit: Mod assist       Patient Response: Cooperative  Transfers Overall transfer level: Needs assistance Equipment used: Rolling walker (2 wheels), None Transfers: Sit to/from Stand Sit to Stand: Min assist, Mod assist, +2 physical assistance           General transfer comment: does quite well today with transfer with good effort.    Ambulation/Gait               General Gait Details: uanble to take any true steps with or without walker   Stairs             Wheelchair Mobility    Modified Rankin (Stroke Patients Only)       Balance Overall balance assessment: Needs assistance Sitting-balance support: Bilateral upper extremity supported, Feet supported Sitting balance-Leahy Scale: Fair   Postural control: Posterior lean Standing balance support: Bilateral upper extremity supported, During functional activity, Reliant on assistive device for balance Standing balance-Leahy Scale: Poor                              Cognition Arousal/Alertness: Awake/alert Behavior During Therapy: WFL for tasks assessed/performed Overall Cognitive Status: History of cognitive impairments - at baseline                                          Exercises Other Exercises Other Exercises: RLE AA/PROM  x 10 in supine    General Comments        Pertinent Vitals/Pain Pain Assessment Pain Assessment: Faces Faces Pain Scale: Hurts a little bit Pain Location: R hip - overall seems generally comfortable at rest, some inc pain with WB and mobility. Pain Descriptors / Indicators: Grimacing, Guarding Pain Intervention(s): Limited activity within patient's tolerance, Monitored during session, Repositioned    Home Living                          Prior Function            PT Goals (current goals can now be found in the care plan section) Progress towards PT goals:  Progressing toward goals    Frequency    7X/week      PT Plan Current plan remains appropriate    Co-evaluation              AM-PAC PT "6 Clicks" Mobility   Outcome Measure  Help needed turning from your back to your side while in a flat bed without using bedrails?: A Lot Help needed moving from lying on your back to sitting on the side of a flat bed without using bedrails?: A Lot Help needed moving to and from a bed to a chair (including a wheelchair)?: A Lot Help needed standing up from a chair using your arms (e.g., wheelchair or bedside chair)?: A Lot Help needed to walk in hospital room?: Total Help needed climbing 3-5 steps with a railing? : Total 6 Click Score: 10    End of Session Equipment Utilized During Treatment: Gait belt;Oxygen Activity Tolerance: Patient tolerated treatment well Patient left: in chair;with call bell/phone within reach;with chair alarm set Nurse Communication: Mobility status PT Visit Diagnosis: Other abnormalities of gait and mobility (R26.89);Muscle weakness (generalized) (M62.81);History of falling (Z91.81);Difficulty in walking, not elsewhere classified (R26.2);Pain Pain - Right/Left: Right Pain - part of body: Hip     Time: 0786-7544 PT Time Calculation (min) (ACUTE ONLY): 10 min  Charges:  $Therapeutic Activity: 8-22 mins                   Chesley Noon, PTA 07/02/22, 10:01 AM

## 2022-07-02 NOTE — Progress Notes (Signed)
Progress Note   Patient: Angel Cameron:[id number] DOB: 10-27-1940 DOA: 06/27/2022     5 DOS: the patient was seen and examined on 07/02/2022   Brief hospital course: [name] is a 82 year old female with dementia, hypertension, hyperlipidemia, hypothyroid, GERD, who presents emergency department for chief concerns of mechanical fall and right hip pain.  Initial vitals in the ED showed temperature of 98, respiration rate of 16, heart rate of 108, blood pressure 141/85, SpO2 of 91% on room air.  Patient was noted to be sleeping and that her oxygen saturations dropped to the 80s and patient was placed on 2 L nasal cannula with appropriate function saturation improvement.  Serum sodium is 136, potassium 4.1, chloride 99, bicarb 26, BUN of 24, serum creatinine of 1.02, EGFR is 55, nonfasting blood glucose 181, WBC 5.8, hemoglobin 11, platelets of 155.  CT of the head without contrast and CT cervical spine without contrast: No acute injury or fracture.  Right hip x-ray 2-3 views: Displaced, noncommitted varus angulation right femoral neck fracture  ED treatment: Acetaminophen 1000 mg p.o. one-time dose, morphine 2 mg IV one-time dose, ondansetron 4 mg IV.  EDP consulted orthopedic service, Dr. Mack Guise who states he will see the patient.  1/11: Vitals and labs stable.  Respiratory panel negative.  Going to OR with orthopedic surgery later today.  1/12: Vitals stable.  S/p right hip hemiarthroplasty, hemoglobin decreased to 9.5 postsurgically.  BMP seems stable with very mild worsening of creatinine to 1.13 but remained within baseline. PT/OT are recommending SNF.  1/13: Patient became febrile at 101, urine cultures with pansensitive E. coli.  Patient was placed on ceftriaxone on 1/12.  Hemoglobin with further decreased to 8.4, creatinine improving.  1/14: Patient remained afebrile after 1 incidence of being febrile yesterday morning.  Hemoglobin seems stable at 8.4.  Chest  x-ray was repeated after nursing complaint of new onset cough and it shows linear atelectasis at left lung base along with prominence of bilateral hilar region which may reflect underlying pulmonary hypertension. Switching her to Keflex as she is able to take p.o. now.  Will complete 5-day course for UTI Awaiting SNF placement.  1/15: Hemodynamically stable with no new concern.  Still no bed offer.  Awaiting SNF placement   Assessment and Plan: * Closed right hip fracture ([place]) Secondary to mechanical fall, s/p hemiarthroplasty.  Tolerated the procedure well PT/OT are recommending SNF -Continue with pain management -Fall precautions -TOC to work on placement  UTI (urinary tract infection) Urine cultures with pansensitive E. coli.  Patient became febrile this morning. Concern of mild dysuria. -Switch her to Keflex to complete a 5-day course  Essential hypertension - Benazepril 20 mg p.o. twice daily metoprolol succinate 50 mg p.o. daily were resumed  Chronic kidney disease (CKD) stage G2/A1, mildly decreased glomerular filtration rate (GFR) between 60-89 mL/min/1.73 square meter and albuminuria creatinine ratio less than 30 mg/g - Appears to be at baseline  Hypothyroidism - Levothyroxine 75 mcg daily resumed  Dementia without behavioral disturbance (HCC) - Resumed memantine 5 mg daily, donepezil 5 milligrams nightly  Hyperlipidemia - Atorvastatin 10 mg daily resumed  Protein-calorie malnutrition, severe Estimated body mass index is 24.51 kg/m as calculated from the following:   Height as of this encounter: 5\' 2"  (1.575 m).   Weight as of this encounter: 60.8 kg.   -Dietitian consult       Subjective: Patient was sitting comfortably in chair when seen today.  Denies any pain.  Physical  Exam: Vitals:   07/01/22 0802 07/01/22 1644 07/02/22 0131 07/02/22 0729  BP: (!) 146/59 (!) 140/67 106/63 (!) 140/65  Pulse: 84 91 78 90  Resp: 16 17 20 16   Temp: 98.9 F (37.2 C)  98.5 F (36.9 C) 97.9 F (36.6 C) 98.6 F (37 C)  TempSrc:      SpO2: 92% 95% 90% 100%  Weight:      Height:       General.  Frail and malnourished elderly lady, in no acute distress. Pulmonary.  Lungs clear bilaterally, normal respiratory effort. CV.  Regular rate and rhythm, no JVD, rub or murmur. Abdomen.  Soft, nontender, nondistended, BS positive. CNS.  Alert and oriented to self only.  No focal neurologic deficit. Extremities.  No edema, no cyanosis, pulses intact and symmetrical. Psychiatry.  Judgment and insight appears impaired.  Data Reviewed: Prior data reviewed  Family Communication: Discussed with daughter on phone  Disposition: Status is: Inpatient Remains inpatient appropriate because: Severity of illness  Planned Discharge Destination: Skilled nursing facility  DVT prophylaxis.  Lovenox Time spent: 39 minutes  This record has been created using Systems analyst. Errors have been sought and corrected,but may not always be located. Such creation errors do not reflect on the standard of care.   Author: Lorella Nimrod, MD 07/02/2022 1:27 PM  For on call review [email or web address].

## 2022-07-02 NOTE — Progress Notes (Signed)
  Subjective:  POD #4 s/p right hip hemiarthroplasty.   Patient sitting up in a chair.  Patient is sliding down in the chair.  I brought this to the attention of the unit coordinator as the patient will need help repositioning from the nursing staff.  Patient is alert and in no acute distress.  She has advanced dementia and is unable to provide a history.  Objective:   VITALS:   Vitals:   07/01/22 0802 07/01/22 1644 07/02/22 0131 07/02/22 0729  BP: (!) 146/59 (!) 140/67 106/63 (!) 140/65  Pulse: 84 91 78 90  Resp: 16 17 20 16   Temp: 98.9 F (37.2 C) 98.5 F (36.9 C) 97.9 F (36.6 C) 98.6 F (37 C)  TempSrc:      SpO2: 92% 95% 90% 100%  Weight:      Height:        PHYSICAL EXAM: Right lower extremity: Patient has an Aquacel dressing in place without significant drainage.  There is faint ecchymyosis and erythema around the dressing.  There is no significant swelling and thigh compartments are soft compressible.  Distally she has palpable pedal pulses.  She is spontaneously moving her right foot.  Pedal pulses are palpable.  Sensation cannot be assessed.   LABS  No results found for this or any previous visit (from the past 24 hour(s)).  DG Chest 2 View  Result Date: 07/01/2022 CLINICAL DATA:  Fever EXAM: CHEST - 2 VIEW COMPARISON:  06/27/2022 FINDINGS: Heart size is mildly enlarged. Prominence of the bilateral hilar regions may reflect underlying pulmonary hypertension. Persistent elevation of the right hemidiaphragm. Linear atelectasis at the left lung base. No pleural effusion or pneumothorax. IMPRESSION: 1. Linear atelectasis at the left lung base. 2. Prominence of the bilateral hilar regions may reflect underlying pulmonary hypertension. Electronically Signed   By: Davina Poke D.O.   On: 07/01/2022 11:22    Assessment/Plan: 4 Days Post-Op   Principal Problem:   Closed right hip fracture (HCC) Active Problems:   Chronic kidney disease (CKD) stage G2/A1, mildly  decreased glomerular filtration rate (GFR) between 60-89 mL/min/1.73 square meter and albuminuria creatinine ratio less than 30 mg/g   Essential hypertension   Dementia without behavioral disturbance (HCC)   Hyperlipidemia   Hypothyroidism   Protein-calorie malnutrition, severe   UTI (urinary tract infection)  Continue with physical therapy as the patient can tolerate.  Patient on Keflex for treatment of an E. coli UTI.  Patient will need a skilled nursing facility upon discharge.  Patient on Lovenox for DVT prophylaxis.    Thornton Park , MD 07/02/2022, 1:30 PM

## 2022-07-02 NOTE — TOC Progression Note (Signed)
Transition of Care [facility]) - Progression Note    Patient Details  Name: Angel Cameron MRN: [id number] Date of Birth: 20-Jan-1941  [facility] CM/SW [place], RN Phone Number: 07/02/2022, 12:51 PM  Clinical Narrative:   Obtained pASSR [id number], No bed offers, resent the bedsearch    Expected Discharge Plan: [place] Barriers to Discharge: Continued Medical Work up, SNF Pending bed offer, Insurance Authorization  Expected Discharge Plan and [place] arrangements for the past 2 months: [place] ([place] care)                                       Social Determinants of Health (SDOH) Interventions SDOH Screenings   Tobacco Use: Low Risk  (06/29/2022)    Readmission Risk Interventions     No data to display

## 2022-07-02 NOTE — Progress Notes (Signed)
Occupational Therapy Treatment Patient Details Name: Angel Cameron MRN: [id number] DOB: 08/29/1940 Today's Date: 07/02/2022   History of present illness Pt is a 82 y.o. female s/p R hip hemiarthroplasty 06/28/22 afte mechanical fall.   OT comments  Pt received in bed and agreeable to OT. She required physical assistance this date to complete bed mobility, functional transfers, and UB dressing. After first standing attempt from EOB, pt demonstrating heavy posterior lean and unable to self correct. Seated rest break required. She then completed stand pivot transfer from EOB>recliner. Pt left sitting up in recliner with all needs in reach. Pt is making progress toward goal completion. D/C recommendation remains appropriate. OT will continue to follow acutely.    Recommendations for follow up therapy are one component of a multi-disciplinary discharge planning process, led by the attending physician.  Recommendations may be updated based on patient status, additional functional criteria and insurance authorization.    Follow Up Recommendations  Skilled nursing-short term rehab (<3 hours/day)     Assistance Recommended at Discharge Frequent or constant Supervision/Assistance  Patient can return home with the following  A lot of help with walking and/or transfers;A lot of help with bathing/dressing/bathroom   Equipment Recommendations  None recommended by OT    Recommendations for Other Services      Precautions / Restrictions Precautions Precautions: Posterior Hip;Fall Precaution Booklet Issued: No Precaution Comments: hx of dementia Restrictions Weight Bearing Restrictions: Yes RLE Weight Bearing: Weight bearing as tolerated Other Position/Activity Restrictions: posterior hip precautions       Mobility Bed Mobility Overal bed mobility: Needs Assistance Bed Mobility: Supine to Sit     Supine to sit: Mod assist          Transfers Overall transfer level: Needs  assistance Equipment used: Rolling walker (2 wheels), 2 person hand held assist Transfers: Sit to/from Stand, Bed to chair/wheelchair/BSC Sit to Stand: Min assist, +2 physical assistance Stand pivot transfers: Mod assist, +2 physical assistance         General transfer comment: STS from EOB with Min A +2 using RW. Pt with heavy post lean in standing & unable to self correct, required seated rest break. Pt then completed stand pivot transfer to recliner with Mod A +2 via HHA.     Balance Overall balance assessment: Needs assistance Sitting-balance support: Bilateral upper extremity supported, Feet supported Sitting balance-Leahy Scale: Fair   Postural control: Posterior lean Standing balance support: Bilateral upper extremity supported, During functional activity, Reliant on assistive device for balance Standing balance-Leahy Scale: Poor                             ADL either performed or assessed with clinical judgement   ADL Overall ADL's : Needs assistance/impaired     Grooming: Set up;Sitting;Wash/dry face;Brushing hair           Upper Body Dressing : Sitting;Cueing for sequencing;Minimal assistance Upper Body Dressing Details (indicate cue type and reason): max multimodal cues for initiation/sequencing         Toileting- Clothing Manipulation and Hygiene: Maximal assistance;Sit to/from stand              Extremity/Trunk Assessment Upper Extremity Assessment Upper Extremity Assessment: Generalized weakness   Lower Extremity Assessment Lower Extremity Assessment: Generalized weakness;RLE deficits/detail RLE Deficits / Details: R hip hemiarthroplasty        Vision Patient Visual Report: No change from baseline     Perception  Praxis      Cognition Arousal/Alertness: Awake/alert Behavior During Therapy: WFL for tasks assessed/performed Overall Cognitive Status: History of cognitive impairments - at baseline                                  General Comments: pleasant, cooperative. Inconsistently followed single step commands at times. Unable to recall posterior hip precautions.        Exercises      Shoulder Instructions       General Comments      Pertinent Vitals/ Pain       Pain Assessment Pain Assessment: Faces Faces Pain Scale: Hurts a little bit Pain Location: R hip - overall seems generally comfortable at rest, some inc pain with WB and mobility. Pain Descriptors / Indicators: Grimacing, Guarding Pain Intervention(s): Limited activity within patient's tolerance, Monitored during session, Repositioned  Home Living                                          Prior Functioning/Environment              Frequency  Min 2X/week        Progress Toward Goals  OT Goals(current goals can now be found in the care plan section)  Progress towards OT goals: Progressing toward goals  Acute Rehab OT Goals Patient Stated Goal: none stated OT Goal Formulation: Patient unable to participate in goal setting Time For Goal Achievement: 07/13/22 Potential to Achieve Goals: Good  Plan Discharge plan remains appropriate;Frequency remains appropriate    Co-evaluation                 AM-PAC OT "6 Clicks" Daily Activity     Outcome Measure   Help from another person eating meals?: A Little Help from another person taking care of personal grooming?: A Little Help from another person toileting, which includes using toliet, bedpan, or urinal?: A Lot Help from another person bathing (including washing, rinsing, drying)?: A Lot Help from another person to put on and taking off regular upper body clothing?: A Little Help from another person to put on and taking off regular lower body clothing?: Total 6 Click Score: 14    End of Session Equipment Utilized During Treatment: Rolling walker (2 wheels);Gait belt  OT Visit Diagnosis: Unsteadiness on feet (R26.81);Muscle weakness  (generalized) (M62.81);Pain;Other abnormalities of gait and mobility (R26.89) Pain - Right/Left: Right Pain - part of body: Hip   Activity Tolerance Patient tolerated treatment well   Patient Left in chair;with call bell/phone within reach;with chair alarm set   Nurse Communication Mobility status        Time: 6256-3893 OT Time Calculation (min): 18 min  Charges: OT General Charges $OT Visit: 1 Visit OT Treatments $Self Care/Home Management : 8-22 mins  [facility] MS, OTR/L ascom [phone]  07/02/22, 11:38 AM

## 2022-07-03 DIAGNOSIS — S72001A Fracture of unspecified part of neck of right femur, initial encounter for closed fracture: Secondary | ICD-10-CM | POA: Diagnosis not present

## 2022-07-03 MED ORDER — BOOST / RESOURCE BREEZE PO LIQD CUSTOM
1.0000 | Freq: Three times a day (TID) | ORAL | Status: DC
  Administered 2022-07-03 – 2022-07-04 (×5): 1 via ORAL

## 2022-07-03 MED ORDER — PROSOURCE PLUS PO LIQD
30.0000 mL | Freq: Two times a day (BID) | ORAL | Status: DC
  Administered 2022-07-03 – 2022-07-04 (×4): 30 mL via ORAL
  Filled 2022-07-03 (×4): qty 30

## 2022-07-03 NOTE — TOC Progression Note (Signed)
Transition of Care [facility]) - Progression Note    Patient Details  Name: Angel Cameron MRN: [id number] Date of Birth: 06/05/1941  Transition of Care [facility]) CM/SW [place], RN Phone Number: 07/03/2022, 11:44 AM  Clinical Narrative:    Spoke with Daughter Freda Munro and reviewed bed offers, I encouraged her to go to Medicare.gov and review the star ratings, I let her know we have to also get ins approval She will review and get back to me with choice   Expected Discharge Plan: [place] Barriers to Discharge: Continued Medical Work up, SNF Pending bed offer, Ship broker  Expected Discharge Plan and [place] arrangements for the past 2 months: [place] ([place] care)                                       Social Determinants of Health (SDOH) Interventions SDOH Screenings   Tobacco Use: Low Risk  (06/29/2022)    Readmission Risk Interventions     No data to display

## 2022-07-03 NOTE — Progress Notes (Signed)
Progress Note   Patient: Angel Cameron ZDG:[id number] DOB: [birth year] DOA: 06/27/2022     6 DOS: the patient was seen and examined on 07/03/2022   Brief hospital course: [name] is a 82 year old female with dementia, hypertension, hyperlipidemia, hypothyroid, GERD, who presents emergency department for chief concerns of mechanical fall and right hip pain.  Initial vitals in the ED showed temperature of 98, respiration rate of 16, heart rate of 108, blood pressure 141/85, SpO2 of 91% on room air.  Patient was noted to be sleeping and that her oxygen saturations dropped to the 80s and patient was placed on 2 L nasal cannula with appropriate function saturation improvement.  Serum sodium is 136, potassium 4.1, chloride 99, bicarb 26, BUN of 24, serum creatinine of 1.02, EGFR is 55, nonfasting blood glucose 181, WBC 5.8, hemoglobin 11, platelets of 155.  CT of the head without contrast and CT cervical spine without contrast: No acute injury or fracture.  Right hip x-ray 2-3 views: Displaced, noncommitted varus angulation right femoral neck fracture  ED treatment: Acetaminophen 1000 mg p.o. one-time dose, morphine 2 mg IV one-time dose, ondansetron 4 mg IV.  EDP consulted orthopedic service, Dr. Mack Guise who states he will see the patient.  1/11: Vitals and labs stable.  Respiratory panel negative.  Going to OR with orthopedic surgery later today.  1/12: Vitals stable.  S/p right hip hemiarthroplasty, hemoglobin decreased to 9.5 postsurgically.  BMP seems stable with very mild worsening of creatinine to 1.13 but remained within baseline. PT/OT are recommending SNF.  1/13: Patient became febrile at 101, urine cultures with pansensitive E. coli.  Patient was placed on ceftriaxone on 1/12.  Hemoglobin with further decreased to 8.4, creatinine improving.  1/14: Patient remained afebrile after 1 incidence of being febrile yesterday morning.  Hemoglobin seems stable at 8.4.  Chest  x-ray was repeated after nursing complaint of new onset cough and it shows linear atelectasis at left lung base along with prominence of bilateral hilar region which may reflect underlying pulmonary hypertension. Switching her to Keflex as she is able to take p.o. now.  Will complete 5-day course for UTI Awaiting SNF placement.  1/15: Hemodynamically stable with no new concern.  Still no bed offer.  Awaiting SNF placement.  1/16: Remained stable, had a bed offer, pending insurance authorization.   Assessment and Plan: * Closed right hip fracture ([place]) Secondary to mechanical fall, s/p hemiarthroplasty.  Tolerated the procedure well PT/OT are recommending SNF -Continue with pain management -Fall precautions -TOC to work on placement  UTI (urinary tract infection) Urine cultures with pansensitive E. coli.  Patient became febrile this morning. Concern of mild dysuria. -Switch her to Keflex to complete a 5-day course  Essential hypertension - Benazepril 20 mg p.o. twice daily metoprolol succinate 50 mg p.o. daily were resumed  Chronic kidney disease (CKD) stage G2/A1, mildly decreased glomerular filtration rate (GFR) between 60-89 mL/min/1.73 square meter and albuminuria creatinine ratio less than 30 mg/g - Appears to be at baseline  Hypothyroidism - Levothyroxine 75 mcg daily resumed  Dementia without behavioral disturbance (HCC) - Resumed memantine 5 mg daily, donepezil 5 milligrams nightly  Hyperlipidemia - Atorvastatin 10 mg daily resumed  Protein-calorie malnutrition, severe Estimated body mass index is 24.51 kg/m as calculated from the following:   Height as of this encounter: 5\' 2"  (1.575 m).   Weight as of this encounter: 60.8 kg.   -Dietitian consult       Subjective: Patient was sitting in  bed and eating breakfast when seen today.  Pain seems well-controlled.  No new complaint.  Physical Exam: Vitals:   07/02/22 0131 07/02/22 0729 07/03/22 0534 07/03/22 0727   BP: 106/63 (!) 140/65 135/72 (!) 148/71  Pulse: 78 90 88 77  Resp: 20 16 16 17   Temp: 97.9 F (36.6 C) 98.6 F (37 C) 97.6 F (36.4 C) 98.7 F (37.1 C)  TempSrc:      SpO2: 90% 100% 96% 96%  Weight:      Height:       General.  Frail and malnourished elderly lady, in no acute distress. Pulmonary.  Lungs clear bilaterally, normal respiratory effort. CV.  Regular rate and rhythm, no JVD, rub or murmur. Abdomen.  Soft, nontender, nondistended, BS positive. CNS.  Alert and oriented .  No focal neurologic deficit. Extremities.  No edema, no cyanosis, pulses intact and symmetrical. Psychiatry.  Judgment and insight appears impaired  Data Reviewed: Prior data reviewed  Family Communication: Discussed with daughter at bedside  Disposition: Status is: Inpatient Remains inpatient appropriate because: Severity of illness  Planned Discharge Destination: Skilled nursing facility  DVT prophylaxis.  Lovenox Time spent: 37 minutes  This record has been created using Systems analyst. Errors have been sought and corrected,but may not always be located. Such creation errors do not reflect on the standard of care.   Author: Lorella Nimrod, MD 07/03/2022 3:25 PM  For on call review [email or web address].

## 2022-07-03 NOTE — Plan of Care (Signed)
  Problem: Education: Goal: Knowledge of General Education information will improve Description: Including pain rating scale, medication(s)/side effects and non-pharmacologic comfort measures Outcome: Progressing   Problem: Activity: Goal: Risk for activity intolerance will decrease Outcome: Progressing   Problem: Pain Managment: Goal: General experience of comfort will improve Outcome: Progressing

## 2022-07-03 NOTE — Progress Notes (Signed)
  Subjective:  POD #5 s/p right hip hemiarthroplasty.   Patient in the process of getting cleaned up by the nursing staff.  She is in no acute distress.  Patient has advanced dementia and is unable to provide a history.  Is not appear to have any significant lower extremity pain.   Objective:   VITALS:   Vitals:   07/02/22 0131 07/02/22 0729 07/03/22 0534 07/03/22 0727  BP: 106/63 (!) 140/65 135/72 (!) 148/71  Pulse: 78 90 88 77  Resp: 20 16 16 17   Temp: 97.9 F (36.6 C) 98.6 F (37 C) 97.6 F (36.4 C) 98.7 F (37.1 C)  TempSrc:      SpO2: 90% 100% 96% 96%  Weight:      Height:        PHYSICAL EXAM: Right lower extremity Neurovascular intact Sensation intact distally Intact pulses distally Dorsiflexion/Plantar flexion intact Incision: dressing C/D/I No cellulitis present Compartment soft  LABS  No results found for this or any previous visit (from the past 24 hour(s)).  No results found.  Assessment/Plan: 5 Days Post-Op   Principal Problem:   Closed right hip fracture (HCC) Active Problems:   Chronic kidney disease (CKD) stage G2/A1, mildly decreased glomerular filtration rate (GFR) between 60-89 mL/min/1.73 square meter and albuminuria creatinine ratio less than 30 mg/g   Essential hypertension   Dementia without behavioral disturbance (HCC)   Hyperlipidemia   Hypothyroidism   Protein-calorie malnutrition, severe   UTI (urinary tract infection)   Patient is stable postop.  She may continue physical therapy as tolerated.  She is weightbearing as tolerated on the right lower extremity with posterior hip precautions.  Is awaiting approval for skilled nursing facility.  I spoke with the family member outside the patient's room this morning.  They have not heard anything yet from case management regarding date of discharge.  Patient will continue on Lovenox for DVT prophylaxis.  She may be discharged to rehab once cleared medically.  Patient will follow-up with me in  the office in 2 weeks.  She should continue Lovenox until her follow-up in the office.   Thornton [place] , MD 07/03/2022, 11:28 AM

## 2022-07-03 NOTE — Anesthesia Postprocedure Evaluation (Signed)
Anesthesia Post Note  Patient: Angel Cameron  Procedure(s) Performed: ARTHROPLASTY BIPOLAR HIP (HEMIARTHROPLASTY) (Right: Hip)  Patient location during evaluation: PACU Anesthesia Type: Spinal Level of consciousness: awake and alert Pain management: pain level controlled Vital Signs Assessment: post-procedure vital signs reviewed and stable Respiratory status: spontaneous breathing, nonlabored ventilation, respiratory function stable and patient connected to nasal cannula oxygen Cardiovascular status: blood pressure returned to baseline and stable Postop Assessment: no apparent nausea or vomiting Anesthetic complications: no   There were no known notable events for this encounter.   Last Vitals:  Vitals:   07/03/22 0534 07/03/22 0727  BP: 135/72 (!) 148/71  Pulse: 88 77  Resp: 16 17  Temp: 36.4 C 37.1 C  SpO2: 96% 96%    Last Pain:  Vitals:   07/02/22 2120  TempSrc:   PainSc: [name]

## 2022-07-03 NOTE — TOC Progression Note (Signed)
Transition of Care [facility]) - Progression Note    Patient Details  Name: Angel Cameron MRN: [id number] Date of Birth: 1941-03-30  Transition of Care [facility]) CM/SW [place], RN Phone Number: 07/03/2022, 1:42 PM  Clinical Narrative:    Freda Munro the daughter called with [facility] as choice, Ins pending   Expected Discharge Plan: Skilled Nursing Facility Barriers to Discharge: Continued Medical Work up, SNF Pending bed offer, Insurance Authorization  Expected Discharge Plan and [place] arrangements for the past 2 months: [place] ([place] care)                                       Social Determinants of Health (SDOH) Interventions SDOH Screenings   Tobacco Use: Low Risk  (06/29/2022)    Readmission Risk Interventions     No data to display

## 2022-07-03 NOTE — Progress Notes (Signed)
Physical Therapy Treatment Patient Details Name: Angel Cameron MRN: [id number] DOB: 03-Jul-1940 Today's Date: 07/03/2022   History of Present Illness Pt is a 82 y.o. female s/p R hip hemiarthroplasty 06/28/22 afte mechanical fall.    PT Comments    Pt in bed needing to use commode.  She is able to progress to EOB with min a x 1.  Stands with walker but is unable to step safely to commode despite heavy mod a x 1.  She is able to stand pivot with mod a x 1 to [facility] for large soft BM and to void.  Care provided.  She is able to transfer to recliner at bedside and positioned for comfort in chair.  Overall improving mobility.    Recommendations for follow up therapy are one component of a multi-disciplinary discharge planning process, led by the attending physician.  Recommendations may be updated based on patient status, additional functional criteria and insurance authorization.  Follow Up Recommendations  Skilled nursing-short term rehab (<3 hours/day) Can patient physically be transported by private vehicle: No   Assistance Recommended at Discharge Frequent or constant Supervision/Assistance  Patient can return home with the following Direct supervision/assist for medications management;Help with stairs or ramp for entrance;Assist for transportation;A little help with walking and/or transfers;A little help with bathing/dressing/bathroom   Equipment Recommendations       Recommendations for Other Services       Precautions / Restrictions Precautions Precautions: Posterior Hip;Fall Precaution Booklet Issued: No Precaution Comments: hx of dementia Restrictions Weight Bearing Restrictions: Yes RLE Weight Bearing: Weight bearing as tolerated     Mobility  Bed Mobility Overal bed mobility: Needs Assistance Bed Mobility: Supine to Sit     Supine to sit: Min assist          Transfers Overall transfer level: Needs assistance Equipment used: Rolling walker (2 wheels), 1 person  hand held assist Transfers: Sit to/from Stand, Bed to chair/wheelchair/BSC Sit to Stand: Mod assist Stand pivot transfers: Mod assist         General transfer comment: unable to transfer with RW but does well with stand pivot +1 today    Ambulation/Gait               General Gait Details: uanble to take any true steps with or without walker   Stairs             Wheelchair Mobility    Modified Rankin (Stroke Patients Only)       Balance Overall balance assessment: Needs assistance Sitting-balance support: Bilateral upper extremity supported, Feet supported Sitting balance-Leahy Scale: Fair     Standing balance support: Bilateral upper extremity supported, During functional activity, Reliant on assistive device for balance Standing balance-Leahy Scale: Poor Standing balance comment: hands on assist at all times                            Cognition Arousal/Alertness: Awake/alert Behavior During Therapy: WFL for tasks assessed/performed Overall Cognitive Status: History of cognitive impairments - at baseline                                          Exercises      General Comments        Pertinent Vitals/Pain Pain Assessment Pain Assessment: Faces Faces Pain Scale: Hurts little more Pain Location: R hip -  overall seems generally comfortable at rest, some inc pain with WB and mobility. Pain Descriptors / Indicators: Sore Pain Intervention(s): Limited activity within patient's tolerance, Monitored during session, Repositioned    Home Living                          Prior Function            PT Goals (current goals can now be found in the care plan section) Progress towards PT goals: Progressing toward goals    Frequency    7X/week      PT Plan Current plan remains appropriate    Co-evaluation              AM-PAC PT "6 Clicks" Mobility   Outcome Measure  Help needed turning from your  back to your side while in a flat bed without using bedrails?: A Lot Help needed moving from lying on your back to sitting on the side of a flat bed without using bedrails?: A Lot Help needed moving to and from a bed to a chair (including a wheelchair)?: A Lot Help needed standing up from a chair using your arms (e.g., wheelchair or bedside chair)?: A Lot Help needed to walk in hospital room?: Total Help needed climbing 3-5 steps with a railing? : Total 6 Click Score: 10    End of Session Equipment Utilized During Treatment: Gait belt;Oxygen Activity Tolerance: Patient tolerated treatment well Patient left: in chair;with call bell/phone within reach;with chair alarm set Nurse Communication: Mobility status PT Visit Diagnosis: Other abnormalities of gait and mobility (R26.89);Muscle weakness (generalized) (M62.81);History of falling (Z91.81);Difficulty in walking, not elsewhere classified (R26.2);Pain Pain - Right/Left: Right Pain - part of body: Hip     Time: 6761-9509 PT Time Calculation (min) (ACUTE ONLY): 25 min  Charges:  $Therapeutic Activity: 23-37 mins                   Chesley Noon, PTA 07/03/22, 12:50 PM

## 2022-07-03 NOTE — Progress Notes (Signed)
Nutrition Follow-up  DOCUMENTATION CODES:   Severe malnutrition in context of chronic illness  INTERVENTION:   -D/c Ensure Enlive po BID, each supplement provides 350 kcal and 20 grams of protein -30 ml Prosource Plus BID, each supplement provides 100 kcals and 15 grams protein -Boost Breeze po TID, each supplement provides 250 kcal and 9 grams of protein  -Continue MVI with minerals daily -Continue Magic cup TID with meals, each supplement provides 290 kcal and 9 grams of protein  -Feeding assistance with meals   NUTRITION DIAGNOSIS:   Severe Malnutrition related to chronic illness (dementia) as evidenced by moderate fat depletion, severe fat depletion, moderate muscle depletion, severe muscle depletion, percent weight loss.  Ongoing  GOAL:   Patient will meet greater than or equal to 90% of their needs  Unmet  MONITOR:   PO intake, Supplement acceptance  REASON FOR ASSESSMENT:   Consult Hip fracture protocol, Assessment of nutrition requirement/status  ASSESSMENT:   Pt with dementia, hypertension, hyperlipidemia, hypothyroid, GERD, who presents for chief concerns of mechanical fall and right hip pain.  1/11- s/p Procedure(s): Right hip hemiarthroplasty  Reviewed I/O's: +1.7 L x 24 hours  Pt lying in bed at time of visit. She did not respond to voice.   Pt with very poor oral intake. Noted meal completions 0%. Pt is refusing Ensure supplements.   Given pt's advanced age and dementia, would not recommend alternative means of nutrition/hydration as this would not enhance pt's quality of life.    Per TOC notes, plan to d/c SNF once medically stable and placement obtained.    Medications reviewed and include colace and senokot.   Labs reviewed.   Diet Order:   Diet Order             DIET DYS 2 Room service appropriate? 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  Estimated Nutritional Needs:   Kcal:  1800-2000  Protein:  90-105 grams  Fluid:  > 1.8 L    Loistine Chance, RD, LDN, [place] Registered Dietitian II Certified Diabetes Care and Education Specialist Please refer to [facility] for RD and/or RD on-call/weekend/after hours pager

## 2022-07-04 DIAGNOSIS — S72001A Fracture of unspecified part of neck of right femur, initial encounter for closed fracture: Secondary | ICD-10-CM | POA: Diagnosis not present

## 2022-07-04 MED ORDER — ENOXAPARIN SODIUM 40 MG/0.4ML IJ SOSY: 40.0000 mg | PREFILLED_SYRINGE | INTRAMUSCULAR | 0 refills | Status: AC

## 2022-07-04 MED ORDER — HYDROCODONE-ACETAMINOPHEN 5-325 MG PO TABS: 1.0000 | ORAL_TABLET | ORAL | 0 refills | Status: AC | PRN

## 2022-07-04 MED ORDER — BISACODYL 10 MG RE SUPP: 10.0000 mg | Freq: Every day | RECTAL | 0 refills | Status: AC | PRN

## 2022-07-04 MED ORDER — PROSOURCE PLUS PO LIQD: 30.0000 mL | Freq: Two times a day (BID) | ORAL | 0 refills | Status: AC

## 2022-07-04 MED ORDER — TRAZODONE HCL 50 MG PO TABS: 50.0000 mg | ORAL_TABLET | Freq: Every evening | ORAL | 0 refills | Status: AC | PRN

## 2022-07-04 MED ORDER — DOCUSATE SODIUM 100 MG PO CAPS: 100.0000 mg | ORAL_CAPSULE | Freq: Two times a day (BID) | ORAL | 0 refills | Status: AC

## 2022-07-04 NOTE — Progress Notes (Signed)
Physical Therapy Treatment Patient Details Name: Angel Cameron MRN: [id number] DOB: [birth year] Today's Date: 07/04/2022   History of Present Illness Pt is a 82 y.o. female s/p R hip hemiarthroplasty 06/28/22 afte mechanical fall.    PT Comments    Pt seen for PT tx with pt agreeable. Pt with RLE positioned in internal rotation & maintains this throughout session. Pt requires mod assist for bed mobility, mod assist STS & max assist to take a few steps to recliner from EOB with RW. Pt demonstrates decreased ability to sequence stepping & decreased ability to weight shift to allow increased ease of stepping. Pt unable to follow verbal cuing for LE exercises. Recommend SNF rehab upon d/c as per chart pt was ambulating independently without AD prior to admission.     Recommendations for follow up therapy are one component of a multi-disciplinary discharge planning process, led by the attending physician.  Recommendations may be updated based on patient status, additional functional criteria and insurance authorization.  Follow Up Recommendations  Skilled nursing-short term rehab (<3 hours/day) Can patient physically be transported by private vehicle: No   Assistance Recommended at Discharge Frequent or constant Supervision/Assistance  Patient can return home with the following Direct supervision/assist for medications management;Help with stairs or ramp for entrance;Assist for transportation;A lot of help with walking and/or transfers;A lot of help with bathing/dressing/bathroom   Equipment Recommendations  None recommended by PT (TBD in next venue)    Recommendations for Other Services       Precautions / Restrictions Precautions Precautions: Fall;Posterior Hip Precaution Comments: hx of dementia Restrictions Weight Bearing Restrictions: Yes RLE Weight Bearing: Weight bearing as tolerated Other Position/Activity Restrictions: posterior hip precautions     Mobility  Bed  Mobility Overal bed mobility: Needs Assistance Bed Mobility: Supine to Sit     Supine to sit: Min assist, Mod assist, HOB elevated     General bed mobility comments: Multimodal cuing to initiate & complete movement.    Transfers Overall transfer level: Needs assistance Equipment used: Rolling walker (2 wheels) Transfers: Sit to/from Stand, Bed to chair/wheelchair/BSC Sit to Stand: Mod assist           General transfer comment: STS from EOB with mod assist with PT providing facilitation for anterior weight shifting & powering to stand, pt unable to follow cuing for safe hand placement during STS    Ambulation/Gait Ambulation/Gait assistance: Max assist Gait Distance (Feet): 3 Feet Assistive device: Rolling walker (2 wheels)   Gait velocity: decreased     General Gait Details: Pt takes a few steps bed>recliner with RW & max assist. Pt requires max cuing for foot advancement with poor ability to weight shift L<>R to then step. Pt with decreased foot clearance BLE & decreased ability to sequence turning.   Stairs             Wheelchair Mobility    Modified Rankin (Stroke Patients Only)       Balance Overall balance assessment: Needs assistance Sitting-balance support: Bilateral upper extremity supported, Feet supported Sitting balance-Leahy Scale: Fair Sitting balance - Comments: supervision static sitting EOB   Standing balance support: Bilateral upper extremity supported, During functional activity, Reliant on assistive device for balance Standing balance-Leahy Scale: Poor                              Cognition Arousal/Alertness: Awake/alert Behavior During Therapy: WFL for tasks assessed/performed Overall Cognitive Status: History of  cognitive impairments - at baseline                                 General Comments: Pt pleasant, requries extra time to inconsistently follow simple commands, pt with very poor awareness, poor  safety awareness, unable to follow verbal cuing to engage in LE exercises.        Exercises      General Comments        Pertinent Vitals/Pain Pain Assessment Faces Pain Scale: Hurts a little bit Pain Location: R hip with movement Pain Descriptors / Indicators: Discomfort Pain Intervention(s): Monitored during session, Repositioned    Home Living                          Prior Function            PT Goals (current goals can now be found in the care plan section) Acute Rehab PT Goals PT Goal Formulation: Patient unable to participate in goal setting Progress towards PT goals: Progressing toward goals    Frequency    7X/week      PT Plan Current plan remains appropriate    Co-evaluation              AM-PAC PT "6 Clicks" Mobility   Outcome Measure  Help needed turning from your back to your side while in a flat bed without using bedrails?: A Lot Help needed moving from lying on your back to sitting on the side of a flat bed without using bedrails?: A Lot Help needed moving to and from a bed to a chair (including a wheelchair)?: A Lot Help needed standing up from a chair using your arms (e.g., wheelchair or bedside chair)?: A Lot Help needed to walk in hospital room?: Total Help needed climbing 3-5 steps with a railing? : Total 6 Click Score: 10    End of Session   Activity Tolerance: Patient tolerated treatment well Patient left: in chair;with chair alarm set;with call bell/phone within reach Nurse Communication: Mobility status (O2) PT Visit Diagnosis: Other abnormalities of gait and mobility (R26.89);Muscle weakness (generalized) (M62.81);History of falling (Z91.81);Difficulty in walking, not elsewhere classified (R26.2);Unsteadiness on feet (R26.81)     Time: 1001-1016 PT Time Calculation (min) (ACUTE ONLY): 15 min  Charges:  $Therapeutic Activity: 8-22 mins                     Angel Cameron, PT, DPT 07/04/22, 10:25  AM   Angel Cameron 07/04/2022, 10:24 AM

## 2022-07-04 NOTE — TOC Progression Note (Signed)
Transition of Care [facility]) - Progression Note    Patient Details  Name: Angel Cameron MRN: [id number] Date of Birth: July 15, 1940  [facility] Care [facility]) CM/SW [place], RN Phone Number: 07/04/2022, 3:25 PM  Clinical Narrative:   going to room 4 B at [facility], I called to notify the daughter Sheil;a of the Ins approval and DC, Left a vm for a call back  Spoke with Freda Munro and let her know the patient will go to room 4 B and I called EMS EMS called to arrange Transport  Expected Discharge Plan: [place] Barriers to Discharge: Continued Medical Work up, SNF Pending bed offer, Insurance Authorization  Expected Discharge Plan and [place] arrangements for the past 2 months: [place] ([place] care) Expected Discharge Date: 07/04/22                                     Social Determinants of Health (SDOH) Interventions SDOH Screenings   Food Insecurity: Food Insecurity Present (07/04/2022)  Housing: Low Risk  (07/04/2022)  Transportation Needs: No Transportation Needs (07/04/2022)  Utilities: Not At Risk (07/04/2022)  Tobacco Use: Low Risk  (06/29/2022)    Readmission Risk Interventions     No data to display

## 2022-07-04 NOTE — TOC Progression Note (Signed)
Transition of Care [facility]) - Progression Note    Patient Details  Name: Angel Cameron MRN: [id number] Date of Birth: [birth year]  Transition of Care [facility]) CM/SW [place], RN Phone Number: 07/04/2022, 8:51 AM  Clinical Narrative:   [place] needs an updated PT/OT note showing clarification of the patient's PLOF, I notified PT/OT, will send to [facility] once available    Expected Discharge Plan: [place] Barriers to Discharge: Continued Medical Work up, SNF Pending bed offer, Ship broker  Expected Discharge Plan and [place] arrangements for the past 2 months: [place] ([place] care)                                       Social Determinants of Health (SDOH) Interventions SDOH Screenings   Tobacco Use: Low Risk  (06/29/2022)    Readmission Risk Interventions     No data to display

## 2022-07-04 NOTE — Progress Notes (Signed)
Occupational Therapy Treatment Patient Details Name: Angel Cameron MRN: [id number] DOB: April 23, 1941 Today's Date: 07/04/2022   History of present illness Pt is a 82 y.o. female s/p R hip hemiarthroplasty 06/28/22 afte mechanical fall.   OT comments  Upon entering session, pt sitting up in recliner with RN present changing bed linens. Both agreeable to OT. RN requesting that pt sit up in chair longer instead of getting her back to bed. OT noting pt with heavy R lateral lean in sitting and sliding down in chair. Pt able to clear bottom from surface using BUEs on arm rests in order to scoot hips back in chair with Mod VC and supervision. Assistance provided for repositioning pt with pillows to maintain midline position and prevent R lateral lean. Pt required Max A to complete seated grooming tasks despite max multimodal cues for initiation/sequencing and modeling by therapist. Pt left as received with all needs in reach. Pt is making progress toward goal completion. D/C recommendation remains appropriate. OT will continue to follow acutely.   Per chart review from recent discussion with pt's daughter, pt required assistance with bathing, was independent with self-feeding, and was independent with functional mobility prior to admission.   Recommendations for follow up therapy are one component of a multi-disciplinary discharge planning process, led by the attending physician.  Recommendations may be updated based on patient status, additional functional criteria and insurance authorization.    Follow Up Recommendations  Skilled nursing-short term rehab (<3 hours/day)     Assistance Recommended at Discharge Frequent or constant Supervision/Assistance  Patient can return home with the following  A lot of help with walking and/or transfers;A lot of help with bathing/dressing/bathroom   Equipment Recommendations  None recommended by OT    Recommendations for Other Services      Precautions /  Restrictions Precautions Precautions: Fall;Posterior Hip Precaution Comments: hx of dementia Restrictions Weight Bearing Restrictions: Yes RLE Weight Bearing: Weight bearing as tolerated Other Position/Activity Restrictions: posterior hip precautions       Mobility Bed Mobility Overal bed mobility: Needs Assistance             General bed mobility comments: NT, pt received/left in recliner    Transfers                         Balance Overall balance assessment: Needs assistance Sitting-balance support: Bilateral upper extremity supported, Feet supported Sitting balance-Leahy Scale: Poor Sitting balance - Comments: favoring R side today, heavy R lateral lean Postural control: Right lateral lean                                 ADL either performed or assessed with clinical judgement   ADL Overall ADL's : Needs assistance/impaired     Grooming: Maximal assistance;Cueing for sequencing;Sitting;Wash/dry face;Brushing hair                                      Extremity/Trunk Assessment Upper Extremity Assessment Upper Extremity Assessment: Generalized weakness   Lower Extremity Assessment Lower Extremity Assessment: Generalized weakness        Vision Patient Visual Report: No change from baseline     Perception     Praxis      Cognition Arousal/Alertness: Awake/alert Behavior During Therapy: WFL for tasks assessed/performed Overall Cognitive Status: History of cognitive  impairments - at baseline                                 General Comments: Pt pleasant, inconsistently followed single simple commands even with increased time and repetition, poor safety awareness, unable to follow verbal cuing to complete grooming tasks        Exercises      Shoulder Instructions       General Comments      Pertinent Vitals/ Pain       Pain Assessment Pain Assessment: Faces Faces Pain Scale: Hurts a little  bit Pain Location: R hip with movement Pain Descriptors / Indicators: Discomfort Pain Intervention(s): Monitored during session, Repositioned  Home Living                                          Prior Functioning/Environment              Frequency  Min 2X/week        Progress Toward Goals  OT Goals(current goals can now be found in the care plan section)  Progress towards OT goals: Progressing toward goals  Acute Rehab OT Goals Patient Stated Goal: none stated OT Goal Formulation: Patient unable to participate in goal setting Time For Goal Achievement: 07/13/22 Potential to Achieve Goals: [place] Discharge plan remains appropriate;Frequency remains appropriate    Co-evaluation                 AM-PAC OT "6 Clicks" Daily Activity     Outcome Measure   Help from another person eating meals?: A Little Help from another person taking care of personal grooming?: A Little Help from another person toileting, which includes using toliet, bedpan, or urinal?: A Lot Help from another person bathing (including washing, rinsing, drying)?: A Lot Help from another person to put on and taking off regular upper body clothing?: A Little Help from another person to put on and taking off regular lower body clothing?: Total 6 Click Score: 14    End of Session Equipment Utilized During Treatment: Oxygen  OT Visit Diagnosis: Unsteadiness on feet (R26.81);Muscle weakness (generalized) (M62.81);Pain;Other abnormalities of gait and mobility (R26.89) Pain - Right/Left: Right Pain - part of body: Hip   Activity Tolerance Patient tolerated treatment well   Patient Left in chair;with call bell/phone within reach;with chair alarm set   Nurse Communication Mobility status        Time: 5956-3875 OT Time Calculation (min): 13 min  Charges: OT General Charges $OT Visit: 1 Visit OT Treatments $Self Care/Home Management : 8-22 mins  [facility] MS,  OTR/L ascom [phone]  07/04/22, 3:07 PM

## 2022-07-04 NOTE — Progress Notes (Signed)
1618 Report given to Lesle Reek at [facility]. IV removed. Pt in transfer gown with recent VS taken. Awaiting EMS/ Daughter at bedside.

## 2022-07-04 NOTE — Discharge Summary (Signed)
Discharge Summary  Angel [place] DVV:[id number] DOB: Sep 19, 1940  PCP: Angel Carls, MD  Admit date: 06/27/2022 Discharge date: 07/04/2022  Recommendations for Outpatient Follow-up:  Please follow up with your PCP with CBC and BMP in 1-2 weeks Follow up with Dr. Mack [place] of Orthopedic surgery in 2 weeks for wound check and staple removal. You should remain on Enoxaparin (the blood thinner shot) at least until you follow up with him.  Discharge Diagnoses:  Active Hospital Problems   Diagnosis Date Noted   Closed right hip fracture ([place]) 06/27/2022    Priority: High   UTI (urinary tract infection) 06/30/2022    Priority: 1.   Essential hypertension 06/27/2022    Priority: 2.   Chronic kidney disease (CKD) stage G2/A1, mildly decreased glomerular filtration rate (GFR) between 60-89 mL/min/1.73 square meter and albuminuria creatinine ratio less than 30 mg/g 01/10/2019    Priority: 3.   Hypothyroidism 06/27/2022    Priority: 4.   Dementia without behavioral disturbance ([place]) 06/27/2022    Priority: 5.   Hyperlipidemia 06/27/2022    Priority: 6.   Protein-calorie malnutrition, severe 06/28/2022    Priority: 7.    Resolved Hospital Problems  No resolved problems to display.   Discharge Condition: Stable   Diet recommendation: Diet Orders (From admission, onward)     Start     Ordered   07/03/22 0915  DIET DYS 2 Room service appropriate? Yes with Assist; Fluid consistency: Thin  Diet effective now       Comments: Add gravy on meats and potatoes  Question Answer Comment  Room service appropriate? Yes with Assist   Fluid consistency: Thin      07/03/22 0914           HPI and Brief Hospital Course:  This is an 82 year old female with a history of hypertension, hyperlipidemia, dementia, hypothyroidism, GERD presented to the emergency department with mechanical fall and right hip pain.  Right hip x-ray in the emergency department showed displaced, noncomminuted varus  angulated right femoral neck fracture.  She was admitted to the hospitalist service, seen in consultation by orthopedic surgery who performed ORIF on 06/28/2022.  Patient tolerated the procedure well, patient became febrile on 1/13.  Cultures showed pansensitive E. coli.  She completed a course of oral Keflex while she was here in the hospital.  She was seen by PT/OT and recommended subacute nursing facility placement, today patient has received bed offer and insurance authorization and is ready for discharge from the hospital.  She will remain on DVT prophylaxis with Lovenox daily until she follows up as an outpatient with Dr. Mack [place] orthopedic surgery.  Procedures: ORIF 1/11   Consultations: Orthopedic surgery   Discharge details, plan of care and follow up instructions were discussed with patient and any available family or care providers. Patient and family are in agreement with discharge from the hospital today and all questions were answered to their satisfaction.  Discharge Exam: BP (!) 148/92 (BP Location: Right Arm)   Pulse 73   Temp 97.8 F (36.6 C)   Resp 18   Ht 5\' 2"  (1.575 m)   Wt 60.8 kg   SpO2 90%   BMI 24.51 kg/m  General:  Alert, oriented, frail, resting comfortably in the chair when seen this morning calm, in no acute distress  Eyes: EOMI, clear sclerea Neck: supple, no masses, trachea mildline  Cardiovascular: RRR, no murmurs or rubs, no peripheral edema  Respiratory: clear to auscultation bilaterally, no wheezes, no crackles  Abdomen: soft, nontender, nondistended, normal bowel tones heard  Skin: dry, no rashes  Musculoskeletal: no joint effusions, normal range of motion  Psychiatric: appropriate affect, normal speech  Neurologic: extraocular muscles intact, clear speech, moving all extremities with intact sensorium   Discharge Instructions You were cared for by a hospitalist during your hospital stay. If you have any questions about your discharge medications  or the care you received while you were in the hospital after you are discharged, you can call the unit and asked to speak with the hospitalist on call if the hospitalist that took care of you is not available. Once you are discharged, your primary care physician will handle any further medical issues. Please note that NO REFILLS for any discharge medications will be authorized once you are discharged, as it is imperative that you return to your primary care physician (or establish a relationship with a primary care physician if you do not have one) for your aftercare needs so that they can reassess your need for medications and monitor your lab values.   Allergies as of 07/04/2022   No Known Allergies      Medication List     TAKE these medications    (feeding supplement) PROSource Plus liquid Take 30 mLs by mouth 2 (two) times daily between meals.   atorvastatin 10 MG tablet Commonly known as: LIPITOR Take 10 mg by mouth daily.   benazepril 40 MG tablet Commonly known as: LOTENSIN Take 40 mg by mouth 2 (two) times daily.   bisacodyl 10 MG suppository Commonly known as: DULCOLAX Place 1 suppository (10 mg total) rectally daily as needed for moderate constipation.   docusate sodium 100 MG capsule Commonly known as: COLACE Take 1 capsule (100 mg total) by mouth 2 (two) times daily.   donepezil 5 MG tablet Commonly known as: ARICEPT Take 5 mg by mouth at bedtime.   enoxaparin 40 MG/0.4ML injection Commonly known as: LOVENOX Inject 0.4 mLs (40 mg total) into the skin daily for 21 days. Start taking on: July 05, 2022   FLUoxetine 10 MG capsule Commonly known as: PROZAC Take 10 mg by mouth daily.   HYDROcodone-acetaminophen 5-325 MG tablet Commonly known as: NORCO/VICODIN Take 1-2 tablets by mouth every 4 (four) hours as needed for moderate pain (pain score 4-6).   levothyroxine 75 MCG tablet Commonly known as: SYNTHROID Take 75 mcg by mouth daily before breakfast.    meclizine 25 MG tablet Commonly known as: ANTIVERT Take 0.5 tablets (12.5 mg total) by mouth 2 (two) times daily as needed for dizziness.   memantine 5 MG tablet Commonly known as: NAMENDA Take 5 mg by mouth daily.   metoprolol succinate 50 MG 24 hr tablet Commonly known as: TOPROL-XL Take 50 mg by mouth daily.   multivitamin with minerals Tabs tablet Take 1 tablet by mouth daily.   omeprazole 20 MG capsule Commonly known as: PRILOSEC Take 20 mg by mouth daily.   traZODone 50 MG tablet Commonly known as: DESYREL Take 1 tablet (50 mg total) by mouth at bedtime as needed for sleep. What changed:  when to take this reasons to take this       No Known Allergies  Contact information for follow-up providers     Thornton Park, MD Follow up in 2 week(s).   Specialty: Orthopedic Surgery Contact information: [place] [address] [phone]              Contact information for after-discharge care  Destination     [facility] Preferred SNF .   Service: Skilled Nursing Contact information: [address] [place] [address] [phone]                     The results of significant diagnostics from this hospitalization (including imaging, microbiology, ancillary and laboratory) are listed below for reference.    Significant Diagnostic Studies: DG Chest 2 View  Result Date: 07/01/2022 CLINICAL DATA:  Fever EXAM: CHEST - 2 VIEW COMPARISON:  06/27/2022 FINDINGS: Heart size is mildly enlarged. Prominence of the bilateral hilar regions may reflect underlying pulmonary hypertension. Persistent elevation of the right hemidiaphragm. Linear atelectasis at the left lung base. No pleural effusion or pneumothorax. IMPRESSION: 1. Linear atelectasis at the left lung base. 2. Prominence of the bilateral hilar regions may reflect underlying pulmonary hypertension. Electronically Signed   By: Duanne Guess D.O.    On: 07/01/2022 11:22   DG Hip Port Unilat With Pelvis 1V Right  Result Date: 06/28/2022 CLINICAL DATA:  Status post right total hip replacement. EXAM: DG HIP (WITH OR WITHOUT PELVIS) 1V PORT RIGHT COMPARISON:  June 27, 2022. FINDINGS: Status post right hip hemiarthroplasty. Expected postoperative changes are seen in the surrounding soft tissues. IMPRESSION: Status post right hip hemiarthroplasty. Electronically Signed   By: Lupita Raider M.D.   On: 06/28/2022 15:56   CT HEAD WO CONTRAST ( )  Result Date: 06/27/2022 CLINICAL DATA:  Trauma, fall EXAM: CT HEAD WITHOUT CONTRAST TECHNIQUE: Contiguous axial images were obtained from the base of the skull through the vertex without intravenous contrast. RADIATION DOSE REDUCTION: This exam was performed according to the departmental dose-optimization program which includes automated exposure control, adjustment of the mA and/or kV according to patient size and/or use of iterative reconstruction technique. COMPARISON:  01/16/2022 FINDINGS: Brain: No acute intracranial findings are seen. There are no signs of bleeding within the cranium. Calcifications are seen in basal ganglia. Cortical sulci are prominent. There is decreased density in periventricular white matter. Vascular: Unremarkable. Skull: No fracture is seen in calvarium. Sinuses/Orbits: Unremarkable. Other: None. IMPRESSION: No acute intracranial findings are seen. Atrophy. Small-vessel disease. Electronically Signed   By: Ernie Avena M.D.   On: 06/27/2022 16:18   CT Cervical Spine Wo Contrast  Result Date: 06/27/2022 CLINICAL DATA:  Provided history: Neck trauma. Additional history provided: Fall. EXAM: CT CERVICAL SPINE WITHOUT CONTRAST TECHNIQUE: Multidetector CT imaging of the cervical spine was performed without intravenous contrast. Multiplanar CT image reconstructions were also generated. RADIATION DOSE REDUCTION: This exam was performed according to the departmental  dose-optimization program which includes automated exposure control, adjustment of the mA and/or kV according to patient size and/or use of iterative reconstruction technique. COMPARISON:  None. FINDINGS: Mildly motion degraded exam. Alignment: No significant spondylolisthesis. Skull base and vertebrae: The basion-dental and atlanto-dental intervals are maintained.No evidence of acute fracture to the cervical spine. Soft tissues and spinal canal: No prevertebral fluid or swelling. No visible canal hematoma. Disc levels: Cervical spondylosis with multilevel disc space narrowing and disc bulges/central disc protrusions. No appreciable high-grade spinal canal stenosis. No significant bony neural foraminal narrowing. Upper chest: No consolidation within the imaged lung apices. No visible pneumothorax. Other: Left mastoid effusion.  Aortic atherosclerosis. IMPRESSION: 1. Mildly motion degraded exam. 2. No evidence of acute fracture to the cervical spine. 3. Cervical spondylosis, as described. 4.  Aortic Atherosclerosis (ICD10-I70.0). Electronically Signed   By: Jackey Loge D.O.   On: 06/27/2022 14:19  DG Chest 1 View  Result Date: 06/27/2022 CLINICAL DATA:  Preop chest. Patient fell last evening. Right hip fracture. EXAM: CHEST  1 VIEW COMPARISON:  01/10/2019. FINDINGS: Cardiac silhouette is normal in size. No mediastinal or hilar masses. Clear lungs.  No pleural effusion or pneumothorax. Skeletal structures are grossly intact. IMPRESSION: No active disease. Electronically Signed   By: Amie [place] M.D.   On: 06/27/2022 13:25   DG Knee Complete 4 Views Left  Result Date: 06/27/2022 CLINICAL DATA:  Fall.  Left knee pain. EXAM: LEFT KNEE - COMPLETE 4+ VIEW COMPARISON:  None Available. FINDINGS: No fracture.  No bone lesion. Mild medial joint space compartment narrowing. Associated marginal osteophytes. Remaining joint space compartments well preserved. Small superior and inferior patellar marginal spurs. No  joint effusion. Surrounding soft tissues are unremarkable. IMPRESSION: 1. No fracture or acute finding. 2. 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 Electronically Signed   By: Amie [place] M.D.   On: 06/27/2022 13:24   DG Hip Unilat W or Wo Pelvis 2-3 Views Right  Result Date: 06/27/2022 CLINICAL DATA:  Larey Seat last night.  Right hip pain. EXAM: DG HIP (WITH OR WITHOUT PELVIS) 2-3V RIGHT COMPARISON:  None Available. FINDINGS: Right femoral neck fracture. Fractures non comminuted, extending across the mid femoral neck, distal fracture component displaced superiorly by 1.5 cm. There is significant varus angulation. No other fractures.  No bone lesions. Hip joints, SI joints and pubic symphysis are normally aligned. Skeletal structures are demineralized. IMPRESSION: 1. Displaced, non comminuted, varus angulated right femoral neck fracture. Electronically Signed   By: Amie [place] M.D.   On: 06/27/2022 13:23    Microbiology: Recent Results (from the past 240 hour(s))  Resp panel by RT-PCR (RSV, Flu A&B, Covid) Anterior Nasal Swab     Status: None   Collection Time: 06/27/22  4:42 PM   Specimen: Anterior Nasal Swab  Result Value Ref Range Status   SARS Coronavirus 2 by RT PCR NEGATIVE NEGATIVE Final    Comment: (NOTE) SARS-CoV-2 target nucleic acids are NOT DETECTED.  The SARS-CoV-2 RNA is generally detectable in upper respiratory specimens during the acute phase of infection. The lowest concentration of SARS-CoV-2 viral copies this assay can detect is 138 copies/mL. A negative result does not preclude SARS-Cov-2 infection and should not be used as the sole basis for treatment or other patient management decisions. A negative result may occur with  improper specimen collection/handling, submission of specimen other than nasopharyngeal swab, presence of viral mutation(s) within the areas targeted by this assay, and inadequate number of viral copies(<138 copies/mL). A  negative result must be combined with clinical observations, patient history, and epidemiological information. The expected result is Negative.  Fact Sheet for Patients:  BloggerCourse.com  Fact Sheet for Healthcare Providers:  SeriousBroker.it  This test is no t yet approved or cleared by the [place] FDA and  has been authorized for detection and/or diagnosis of SARS-CoV-2 by FDA under an Emergency Use Authorization (EUA). This EUA will remain  in effect (meaning this test can be used) for the duration of the COVID-19 declaration under Section 564(b)(1) of the Act, 21 U.S.C.section 360bbb-3(b)(1), unless the authorization is terminated  or revoked sooner.       Influenza A by PCR NEGATIVE NEGATIVE Final   Influenza B by PCR NEGATIVE NEGATIVE Final    Comment: (NOTE) The Xpert Xpress SARS-CoV-2/FLU/RSV plus assay is intended as an aid in the diagnosis of influenza from Nasopharyngeal swab specimens and should not  be used as a sole basis for treatment. Nasal washings and aspirates are unacceptable for Xpert Xpress SARS-CoV-2/FLU/RSV testing.  Fact Sheet for Patients: BloggerCourse.com  Fact Sheet for Healthcare Providers: SeriousBroker.it  This test is not yet approved or cleared by the [place] FDA and has been authorized for detection and/or diagnosis of SARS-CoV-2 by FDA under an Emergency Use Authorization (EUA). This EUA will remain in effect (meaning this test can be used) for the duration of the COVID-19 declaration under Section 564(b)(1) of the Act, 21 U.S.C. section 360bbb-3(b)(1), unless the authorization is terminated or revoked.     Resp Syncytial Virus by PCR NEGATIVE NEGATIVE Final    Comment: (NOTE) Fact Sheet for Patients: BloggerCourse.com  Fact Sheet for Healthcare  Providers: SeriousBroker.it  This test is not yet approved or cleared by the [place] FDA and has been authorized for detection and/or diagnosis of SARS-CoV-2 by FDA under an Emergency Use Authorization (EUA). This EUA will remain in effect (meaning this test can be used) for the duration of the COVID-19 declaration under Section 564(b)(1) of the Act, 21 U.S.C. section 360bbb-3(b)(1), unless the authorization is terminated or revoked.  Performed at [facility], [address] [place], [place] [address]   Surgical PCR screen     Status: None   Collection Time: 06/28/22  6:25 AM   Specimen: Nasal Mucosa; Nasal Swab  Result Value Ref Range Status   MRSA, PCR NEGATIVE NEGATIVE Final   Staphylococcus aureus NEGATIVE NEGATIVE Final    Comment: (NOTE) The Xpert SA Assay (FDA approved for NASAL specimens in patients 32 years of age and older), is one component of a comprehensive surveillance program. It is not intended to diagnose infection nor to guide or monitor treatment. Performed at [facility], [address] [place], [place] [address]   Urine Culture     Status: Abnormal   Collection Time: 06/28/22 11:16 AM   Specimen: Urine, Catheterized  Result Value Ref Range Status   Specimen Description   Final    URINE, CATHETERIZED Performed at [facility], [address] [place], [place] [address]    Special Requests   Final    NONE Performed at [facility], [address] [place], [place] [address]    Culture >=100,000 COLONIES/mL ESCHERICHIA COLI (A)  Final   Report Status 06/30/2022 FINAL  Final   Organism ID, Bacteria ESCHERICHIA COLI (A)  Final      Susceptibility   Escherichia coli - MIC*    AMPICILLIN <=2 SENSITIVE Sensitive     CEFAZOLIN <=4 SENSITIVE Sensitive     CEFEPIME <=0.12 SENSITIVE Sensitive     CEFTRIAXONE <=0.25 SENSITIVE Sensitive     CIPROFLOXACIN <=0.25 SENSITIVE Sensitive      GENTAMICIN <=1 SENSITIVE Sensitive     IMIPENEM <=0.25 SENSITIVE Sensitive     NITROFURANTOIN <=16 SENSITIVE Sensitive     TRIMETH/SULFA <=20 SENSITIVE Sensitive     AMPICILLIN/SULBACTAM <=2 SENSITIVE Sensitive     PIP/TAZO <=4 SENSITIVE Sensitive     * >=100,000 COLONIES/mL ESCHERICHIA COLI     Labs: Basic Metabolic Panel: Recent Labs  Lab 06/28/22 0516 06/29/22 0456 06/30/22 0614  NA 136 137 138  K 4.1 4.2 4.0  CL 99 102 104  CO2 28 27 26   GLUCOSE 146* 170* 150*  BUN 25* 26* 33*  CREATININE 1.09* 1.13* 1.08*  CALCIUM 8.7* 8.1* 8.3*   Liver Function Tests: No results for input(s): "AST", "ALT", "ALKPHOS", "BILITOT", "PROT", "ALBUMIN" in  the last 168 hours. No results for input(s): "LIPASE", "AMYLASE" in the last 168 hours. No results for input(s): "AMMONIA" in the last 168 hours. CBC: Recent Labs  Lab 06/28/22 0516 06/29/22 0456 06/30/22 0614 07/01/22 0624  WBC 6.8 6.4 5.4 5.7  HGB 10.8* 9.5* 8.4* 8.4*  HCT 33.3* 29.7* 26.5* 26.9*  MCV 86.0 86.8 85.8 87.9  PLT 161 173 190 183   Cardiac Enzymes: No results for input(s): "CKTOTAL", "CKMB", "CKMBINDEX", "TROPONINI" in the last 168 hours. BNP: BNP (last 3 results) No results for input(s): "BNP" in the last 8760 hours.  ProBNP (last 3 results) No results for input(s): "PROBNP" in the last 8760 hours.  CBG: No results for input(s): "GLUCAP" in the last 168 hours.  Time spent: > 30 minutes were spent in preparing this discharge including medication reconciliation, counseling, and coordination of care.  Signed:  Laterica Matarazzo Vergie LivingMohammed Vana Arif, MD  [facility] 07/04/2022, 2:44 PM

## 2022-07-04 NOTE — TOC Progression Note (Signed)
Transition of Care [facility]) - Progression Note    Patient Details  Name: Angel [place] MRN: [id number] Date of Birth: [birth year]  Transition of Care [facility]) CM/SW [place], RN Phone Number: 07/04/2022, 12:28 PM  Clinical Narrative:   Ins approved 1/17- 1/19, review due 1/19. Reference ID: [id number]. Plan auth still generating at this time.     Expected Discharge Plan: [place] Barriers to Discharge: Continued Medical Work up, SNF Pending bed offer, Insurance Authorization  Expected Discharge Plan and [place] arrangements for the past 2 months: [place] ([facility] care)                                       Social Determinants of Health (SDOH) Interventions [place]: Food Insecurity Present (07/04/2022)  Housing: Low Risk  (07/04/2022)  Transportation Needs: No Transportation Needs (07/04/2022)  Utilities: Not At Risk (07/04/2022)  Tobacco Use: Low Risk  (06/29/2022)    Readmission Risk Interventions     No data to display

## 2022-07-04 NOTE — Plan of Care (Signed)
  Problem: Education: Goal: Knowledge of General Education information will improve Description: Including pain rating scale, medication(s)/side effects and non-pharmacologic comfort measures Outcome: Progressing   Problem: Activity: Goal: Risk for activity intolerance will decrease Outcome: Progressing   Problem: Pain Managment: Goal: General experience of comfort will improve Outcome: Progressing

## 2022-07-04 NOTE — Care Management Important Message (Signed)
Important Message  Patient Details  Name: Angel [place] MRN: [id number] Date of Birth: 06-11-1941   Medicare Important Message Given:  Yes     Juliann Pulse A Rashawna Scoles 07/04/2022, 3:26 PM

## 2022-07-04 NOTE — Progress Notes (Signed)
Progress Note   Patient: Angel Cameron YOV:[id number] DOB: July 20, 1940 DOA: 06/27/2022     7 DOS: the patient was seen and examined on 07/04/2022   Brief hospital course: Ms. Sanaz Scarlett is a 82 year old female with dementia, hypertension, hyperlipidemia, hypothyroid, GERD, who presents emergency department for chief concerns of mechanical fall and right hip pain.  CT of the head without contrast and CT cervical spine without contrast: No acute injury or fracture.  Right hip x-ray 2-3 views: Displaced, noncommitted varus angulation right femoral neck fracture  ED treatment: Acetaminophen 1000 mg p.o. one-time dose, morphine 2 mg IV one-time dose, ondansetron 4 mg IV.  1/11: Vitals and labs stable.  Respiratory panel negative.  Going to OR with orthopedic surgery later today.  1/12: Vitals stable.  S/p right hip hemiarthroplasty, hemoglobin decreased to 9.5 postsurgically.  BMP seems stable with very mild worsening of creatinine to 1.13 but remained within baseline. PT/OT are recommending SNF.  1/13: Patient became febrile at 101, urine cultures with pansensitive E. coli.  Patient was placed on ceftriaxone on 1/12.  Hemoglobin with further decreased to 8.4, creatinine improving.  1/14: Patient remained afebrile after 1 incidence of being febrile yesterday morning.  Hemoglobin seems stable at 8.4.  Chest x-ray was repeated after nursing complaint of new onset cough and it shows linear atelectasis at left lung base along with prominence of bilateral hilar region which may reflect underlying pulmonary hypertension. Switching her to Keflex as she is able to take p.o. now.  Will complete 5-day course for UTI Awaiting SNF placement.  1/15: Hemodynamically stable with no new concern.  Still no bed offer.  Awaiting SNF placement.  1/16: Remained stable, had a bed offer, pending insurance authorization.  1/17 doing well with no complaints, awaiting insurance authorization   Assessment and  Plan: * Closed right hip fracture ([place]) Secondary to mechanical fall, s/p hemiarthroplasty.  Tolerated the procedure well PT/OT are recommending SNF -Continue with pain management -Fall precautions -TOC working on placement  UTI (urinary tract infection) Urine cultures with pansensitive E. coli.  -Completed a 5-day course of Keflex  Essential hypertension - Benazepril 20 mg p.o. twice daily metoprolol succinate 50 mg p.o. daily were resumed  Chronic kidney disease (CKD) stage G2/A1, mildly decreased glomerular filtration rate (GFR) between 60-89 mL/min/1.73 square meter and albuminuria creatinine ratio less than 30 mg/g - Appears to be at baseline  Hypothyroidism - Levothyroxine 75 mcg daily resumed  Dementia without behavioral disturbance (HCC) - Resumed memantine 5 mg daily, donepezil 5 milligrams nightly  Hyperlipidemia - Atorvastatin 10 mg daily resumed  Protein-calorie malnutrition, severe Estimated body mass index is 24.51 kg/m as calculated from the following:   Height as of this encounter: 5\' 2"  (1.575 m).   Weight as of this encounter: 60.8 kg.       Subjective:  Sitting in bed having breakfast, no complaints, awaiting SNF placement.  Physical Exam: Vitals:   07/03/22 1636 07/04/22 0004 07/04/22 0122 07/04/22 0847  BP: (!) 148/71 (!) 142/69  (!) 148/92  Pulse: 90 92  73  Resp: 18 14  18   Temp: 97.8 F (36.6 C) 97.9 F (36.6 C)  97.8 F (36.6 C)  TempSrc:      SpO2: 92%  93% 90%  Weight:      Height:      General:  Alert, oriented, frail and malnourished appearing, calm, in no acute distress  Eyes: EOMI, clear conjuctivae, white sclerea Neck: supple, no masses, trachea mildline  Cardiovascular:  RRR, no murmurs or rubs, no peripheral edema  Respiratory: clear to auscultation bilaterally, no wheezes, no crackles  Abdomen: soft, nontender, nondistended, normal bowel tones heard  Skin: dry, no rashes  Musculoskeletal: no joint effusions, normal range of  motion  Psychiatric: appropriate affect, normal speech  Neurologic: extraocular muscles intact, clear speech, moving all extremities with intact sensorium   Data Reviewed: Prior data reviewed  Family Communication: No family at bedside this AM, awaiting placement, daughter updated yesterday by previous MD.  Disposition: Status is: Inpatient Remains inpatient appropriate because: Severity of illness  Planned Discharge Destination: Skilled nursing facility  DVT prophylaxis.  Lovenox Time spent: 26 minutes  Author: Donnielle Addison Marry Guan, MD 07/04/2022 11:07 AM  For on call review [email or web address].

## 2022-07-04 NOTE — TOC Progression Note (Addendum)
Transition of Care [facility]) - Progression Note    Patient Details  Name: Angel Cameron MRN: [id number] Date of Birth: 09/11/1940  Transition of Care [facility]) CM/SW [place], RN Phone Number: 07/04/2022, 2:43 PM  Clinical Narrative:   Reached out to [facility] and asked if they could accept the patient today, awaiting a response  Update she can dc today to [facility]  Expected Discharge Plan: [place] Barriers to Discharge: Continued Medical Work up, SNF Pending bed offer, Insurance Authorization  Expected Discharge Plan and [place] arrangements for the past 2 months: [place] ([place] care)                                       Social Determinants of Health (SDOH) Interventions [place]: Food Insecurity Present (07/04/2022)  Housing: Low Risk  (07/04/2022)  Transportation Needs: No Transportation Needs (07/04/2022)  Utilities: Not At Risk (07/04/2022)  Tobacco Use: Low Risk  (06/29/2022)    Readmission Risk Interventions     No data to display

## 2022-07-04 NOTE — Progress Notes (Signed)
  Subjective:  POD #6 s/p right hip arthroplasty.   Patient has advanced dementia and is unable to provide a history.  She is sitting in a chair.    Objective:   VITALS:   Vitals:   07/03/22 1636 07/04/22 0004 07/04/22 0122 07/04/22 0847  BP: (!) 148/71 (!) 142/69  (!) 148/92  Pulse: 90 92  73  Resp: 18 14  18   Temp: 97.8 F (36.6 C) 97.9 F (36.6 C)  97.8 F (36.6 C)  TempSrc:      SpO2: 92%  93% 90%  Weight:      Height:        PHYSICAL EXAM: Right lower extremity Intact pulses distally Dorsiflexion/Plantar flexion intact Incision: dressing C/D/I No cellulitis present Compartment soft  LABS  No results found for this or any previous visit (from the past 24 hour(s)).  No results found.  Assessment/Plan: 6 Days Post-Op   Principal Problem:   Closed right hip fracture (HCC) Active Problems:   Chronic kidney disease (CKD) stage G2/A1, mildly decreased glomerular filtration rate (GFR) between 60-89 mL/min/1.73 square meter and albuminuria creatinine ratio less than 30 mg/g   Essential hypertension   Dementia without behavioral disturbance (HCC)   Hyperlipidemia   Hypothyroidism   Protein-calorie malnutrition, severe   UTI (urinary tract infection)  Patient stable from an orthopedic standpoint.  Patient will continue physical therapy as tolerated.  She will require a skilled nursing facility upon discharge.  Continue Lovenox for DVT prophylaxis.  Patient may be discharged from an orthopedic standpoint when she is cleared medically.  Follow-up at [facility] in [place] in 2 weeks following discharge for staple removal and wound check.  Continue Lovenox while at skilled nursing facility.    [place] , MD 07/04/2022, 1:02 PM

## 2022-07-04 NOTE — TOC Progression Note (Signed)
Transition of Care [facility]) - Progression Note    Patient Details  Name: Angel Cameron MRN: [id number] Date of Birth: February 17, 1941  Transition of Care [facility]) CM/SW [place], RN Phone Number: 07/04/2022, 10:19 AM  Clinical Narrative:   Called to speak to Daughter Freda Munro, her PLOF was  she walked around independently, she needed assistance in bathing, was independent feeding herself    Expected Discharge Plan: [place] Barriers to Discharge: Continued Medical Work up, SNF Pending bed offer, Ship broker  Expected Discharge Plan and [place] arrangements for the past 2 months: [place] ([place] care)                                       Social Determinants of Health (SDOH) Interventions SDOH Screenings   Tobacco Use: Low Risk  (06/29/2022)    Readmission Risk Interventions     No data to display

## 2022-07-13 ENCOUNTER — Emergency Department: Payer: Medicare HMO

## 2022-07-13 ENCOUNTER — Other Ambulatory Visit: Payer: Self-pay

## 2022-07-13 ENCOUNTER — Emergency Department
Admission: EM | Admit: 2022-07-13 | Discharge: 2022-07-13 | Disposition: A | Discharge status: Skilled Nursing Facility | Payer: Medicare HMO | Attending: Emergency Medicine | Admitting: Emergency Medicine

## 2022-07-13 DIAGNOSIS — S01111A Laceration without foreign body of right eyelid and periocular area, initial encounter: Secondary | ICD-10-CM | POA: Diagnosis not present

## 2022-07-13 DIAGNOSIS — F0390 Unspecified dementia without behavioral disturbance: Secondary | ICD-10-CM | POA: Diagnosis not present

## 2022-07-13 DIAGNOSIS — S0990XA Unspecified injury of head, initial encounter: Secondary | ICD-10-CM

## 2022-07-13 DIAGNOSIS — S0181XA Laceration without foreign body of other part of head, initial encounter: Secondary | ICD-10-CM | POA: Diagnosis not present

## 2022-07-13 DIAGNOSIS — W050XXA Fall from non-moving wheelchair, initial encounter: Secondary | ICD-10-CM | POA: Insufficient documentation

## 2022-07-13 DIAGNOSIS — Y92129 Unspecified place in nursing home as the place of occurrence of the external cause: Secondary | ICD-10-CM | POA: Insufficient documentation

## 2022-07-13 DIAGNOSIS — S0591XA Unspecified injury of right eye and orbit, initial encounter: Secondary | ICD-10-CM | POA: Diagnosis present

## 2022-07-13 DIAGNOSIS — Z96649 Presence of unspecified artificial hip joint: Secondary | ICD-10-CM | POA: Insufficient documentation

## 2022-07-13 DIAGNOSIS — W19XXXA Unspecified fall, initial encounter: Secondary | ICD-10-CM

## 2022-07-13 NOTE — Discharge Instructions (Signed)
Keep laceration site clean and dry.  Patient may be days and gently rinse off during a shower but avoid submersion underwater.  Return to the ER for any severe headache, swelling, warmth redness drainage or any urgent changes in health

## 2022-07-13 NOTE — ED Provider Notes (Signed)
[facility] Provider Note   CSN: [id number] Arrival date & time: 07/13/22  1743     History  Chief Complaint  Patient presents with   Angel Cameron is a 82 y.o. female.  With past medical history of dementia and prior hip fracture with total hip arthroplasty presents to the emergency department for evaluation of fall.  She was at [place] earlier today when she fell out of the wheelchair, she hit her head, there was no LOC.  No reports of being on anticoagulation.  She has laceration to the right eyebrow.  She has not any episodes of vomiting.  Denies any headache.  She denies any lower extremity discomfort.  HPI     Home Medications Prior to Admission medications   Medication Sig Start Date End Date Taking? Authorizing Provider  atorvastatin (LIPITOR) 10 MG tablet Take 10 mg by mouth daily.    [provider]  benazepril (LOTENSIN) 40 MG tablet Take 40 mg by mouth 2 (two) times daily.    [provider]  bisacodyl (DULCOLAX) 10 MG suppository Place 1 suppository (10 mg total) rectally daily as needed for moderate constipation. 07/04/22   Hollice Gong, Mir M, MD  docusate sodium (COLACE) 100 MG capsule Take 1 capsule (100 mg total) by mouth 2 (two) times daily. 07/04/22   Hollice Gong, Mir M, MD  donepezil (ARICEPT) 5 MG tablet Take 5 mg by mouth at bedtime.    [provider]  enoxaparin (LOVENOX) 40 MG/0.4ML injection Inject 0.4 mLs (40 mg total) into the skin daily for 21 days. 07/05/22 07/26/22  Hollice Gong, Mir M, MD  FLUoxetine (PROZAC) 10 MG capsule Take 10 mg by mouth daily. 05/24/22   [provider]  HYDROcodone-acetaminophen (NORCO/VICODIN) 5-325 MG tablet Take 1-2 tablets by mouth every 4 (four) hours as needed for moderate pain (pain score 4-6). 07/04/22   Hollice Gong, Mir M, MD  levothyroxine (SYNTHROID, LEVOTHROID) 75 MCG tablet Take 75 mcg by mouth daily before breakfast.    [provider]  meclizine (ANTIVERT) 25 MG tablet Take 0.5 tablets (12.5 mg total) by mouth 2 (two) times daily as needed for dizziness. 09/02/19   Carrie Mew, MD  memantine (NAMENDA) 5 MG tablet Take 5 mg by mouth daily.    [provider]  metoprolol succinate (TOPROL-XL) 50 MG 24 hr tablet Take 50 mg by mouth daily. 05/24/22   [provider]  Multiple Vitamin (MULTIVITAMIN WITH MINERALS) TABS tablet Take 1 tablet by mouth daily.    [provider]  Nutritional Supplements (,FEEDING SUPPLEMENT, PROSOURCE PLUS) liquid Take 30 mLs by mouth 2 (two) times daily between meals. 07/04/22   Hollice Gong, Mir M, MD  omeprazole (PRILOSEC) 20 MG capsule Take 20 mg by mouth daily.    [provider]  traZODone (DESYREL) 50 MG tablet Take 1 tablet (50 mg total) by mouth at bedtime as needed for sleep. 07/04/22   Lucillie Garfinkel, MD      Allergies    Patient has no known allergies.    Review of Systems   Review of Systems  Physical Exam Updated Vital Signs BP 119/66 (BP Location: Left Arm)   Pulse 73   Temp 98.2 F (36.8 C) (Axillary)   Resp 18   SpO2 99%  Physical Exam Constitutional:      Appearance: She is well-developed.  HENT:     Head: Normocephalic and atraumatic.  Eyes:  Conjunctiva/sclera: Conjunctivae normal.  Cardiovascular:     Rate and Rhythm: Normal rate.  Pulmonary:     Effort: Pulmonary effort is normal. No respiratory distress.  Abdominal:     General: There is no distension.     Tenderness: There is no abdominal tenderness. There is no guarding.  Musculoskeletal:        General: Normal range of motion.     Cervical back: Normal range of motion.     Comments: No bony tenderness throughout the cervical thoracic or lumbar spine.  She has no tenderness to the proximal humerus is, elbows forearms wrist or digits.  She is nontender throughout the abdomen and has no tenderness throughout both hips.  I am able to passively internally  externally rotate both hips with no discomfort and her leg lengths are equal with no shortening  Skin:    General: Skin is warm.     Findings: No rash.     Comments: 2 cm laceration right eyebrow, no active bleeding  Neurological:     General: No focal deficit present.     Mental Status: She is alert and oriented to person, place, and time. Mental status is at baseline.  Psychiatric:        Behavior: Behavior normal.        Thought Content: Thought content normal.     ED Results / Procedures / Treatments   Labs (all labs ordered are listed, but only abnormal results are displayed) Labs Reviewed - No data to display  EKG None  Radiology CT Head Wo Contrast  Result Date: 07/13/2022 CLINICAL DATA:  Witnessed fall from wheelchair EXAM: CT HEAD WITHOUT CONTRAST CT CERVICAL SPINE WITHOUT CONTRAST TECHNIQUE: Multidetector CT imaging of the head and cervical spine was performed following the standard protocol without intravenous contrast. Multiplanar CT image reconstructions of the cervical spine were also generated. RADIATION DOSE REDUCTION: This exam was performed according to the departmental dose-optimization program which includes automated exposure control, adjustment of the mA and/or kV according to patient size and/or use of iterative reconstruction technique. COMPARISON:  CT brain and cervical spine 06/27/2022 FINDINGS: CT HEAD FINDINGS Brain: No acute territorial infarction, hemorrhage or intracranial mass. Moderate atrophy. Moderate chronic small vessel ischemic changes of the white matter. Stable ventricle size. Vascular: No hyperdense vessels.  Carotid vascular calcification Skull: Normal. Negative for fracture or focal lesion. Sinuses/Orbits: Mucosal thickening left maxillary sinus Other: None CT CERVICAL SPINE FINDINGS Alignment: Normal. Skull base and vertebrae: No acute fracture. No primary bone lesion or focal pathologic process. Soft tissues and spinal canal: No prevertebral fluid  or swelling. No visible canal hematoma. Disc levels:  Mild disc space narrowing C4-C5. Upper chest: Negative. Other: None IMPRESSION: 1. No CT evidence for acute intracranial abnormality. Atrophy and chronic small vessel ischemic changes of the white matter. 2. Mild degenerative changes of the cervical spine. No acute osseous abnormality. Electronically Signed   By: Jasmine Pang M.D.   On: 07/13/2022 19:09   CT Cervical Spine Wo Contrast  Result Date: 07/13/2022 CLINICAL DATA:  Witnessed fall from wheelchair EXAM: CT HEAD WITHOUT CONTRAST CT CERVICAL SPINE WITHOUT CONTRAST TECHNIQUE: Multidetector CT imaging of the head and cervical spine was performed following the standard protocol without intravenous contrast. Multiplanar CT image reconstructions of the cervical spine were also generated. RADIATION DOSE REDUCTION: This exam was performed according to the departmental dose-optimization program which includes automated exposure control, adjustment of the mA and/or kV according to patient size and/or use of iterative  reconstruction technique. COMPARISON:  CT brain and cervical spine 06/27/2022 FINDINGS: CT HEAD FINDINGS Brain: No acute territorial infarction, hemorrhage or intracranial mass. Moderate atrophy. Moderate chronic small vessel ischemic changes of the white matter. Stable ventricle size. Vascular: No hyperdense vessels.  Carotid vascular calcification Skull: Normal. Negative for fracture or focal lesion. Sinuses/Orbits: Mucosal thickening left maxillary sinus Other: None CT CERVICAL SPINE FINDINGS Alignment: Normal. Skull base and vertebrae: No acute fracture. No primary bone lesion or focal pathologic process. Soft tissues and spinal canal: No prevertebral fluid or swelling. No visible canal hematoma. Disc levels:  Mild disc space narrowing C4-C5. Upper chest: Negative. Other: None IMPRESSION: 1. No CT evidence for acute intracranial abnormality. Atrophy and chronic small vessel ischemic changes of  the white matter. 2. Mild degenerative changes of the cervical spine. No acute osseous abnormality. Electronically Signed   By: Donavan Foil M.D.   On: 07/13/2022 19:09    Procedures .Marland KitchenLaceration Repair  Date/Time: 07/13/2022 8:05 PM  Performed by: Duanne Guess, PA-C Authorized by: Duanne Guess, PA-C   Consent:    Consent obtained:  Verbal   Consent given by:  Patient Laceration details:    Location:  Face   Face location:  R eyebrow   Length (cm):  2 Treatment:    Area cleansed with:  Povidone-iodine and saline   Irrigation method:  Pressure wash Skin repair:    Repair method:  Tissue adhesive Approximation:    Approximation:  Close Post-procedure details:    Dressing:  Open (no dressing)     Medications Ordered in ED Medications - No data to display  ED Course/ Medical Decision Making/ A&P                             Medical Decision Making  82 year old female with fall, no LOC.  No nausea or vomiting.  CT of the head and neck negative for any acute intracranial process.  No complaints of pain in the lower extremities.  Recent prior total hip arthroplasty.  Leg lengths are equal and she is able to tolerate full bilateral hip internal ex rotation with no discomfort or signs of grimacing.  Laceration is treated with Dermabond.  She is educated on wound care.  Patient family understands signs and symptoms return to the ER for. Final Clinical Impression(s) / ED Diagnoses Final diagnoses:  Fall, initial encounter  Laceration of eyebrow and forehead, right, initial encounter  Injury of head, initial encounter    Rx / DC Orders ED Discharge Orders     None         Renata Caprice 07/13/22 2007    Lavonia Drafts, MD 07/16/22 [id number]

## 2022-07-13 NOTE — ED Triage Notes (Signed)
Pt comes via EMS from [facility] with c/o fall. Pt states she had witnessed fall out of wheelchair. Pt did hit head no thinners no loc. Pt has small laceration to head.   Pt did have recent right hip fracture two weeks ago. Pt may have fell on it again.

## 2022-07-17 ENCOUNTER — Other Ambulatory Visit: Payer: Self-pay

## 2022-07-17 ENCOUNTER — Emergency Department
Admission: EM | Admit: 2022-07-17 | Discharge: 2022-07-17 | Disposition: A | Discharge status: Skilled Nursing Facility | Payer: Medicare HMO | Attending: Emergency Medicine | Admitting: Emergency Medicine

## 2022-07-17 ENCOUNTER — Encounter: Payer: Self-pay | Admitting: *Deleted

## 2022-07-17 ENCOUNTER — Emergency Department: Payer: Medicare HMO

## 2022-07-17 DIAGNOSIS — E039 Hypothyroidism, unspecified: Secondary | ICD-10-CM | POA: Diagnosis not present

## 2022-07-17 DIAGNOSIS — S0083XA Contusion of other part of head, initial encounter: Secondary | ICD-10-CM | POA: Insufficient documentation

## 2022-07-17 DIAGNOSIS — F0390 Unspecified dementia without behavioral disturbance: Secondary | ICD-10-CM | POA: Diagnosis not present

## 2022-07-17 DIAGNOSIS — I10 Essential (primary) hypertension: Secondary | ICD-10-CM | POA: Insufficient documentation

## 2022-07-17 DIAGNOSIS — Z96641 Presence of right artificial hip joint: Secondary | ICD-10-CM | POA: Insufficient documentation

## 2022-07-17 DIAGNOSIS — Y92129 Unspecified place in nursing home as the place of occurrence of the external cause: Secondary | ICD-10-CM | POA: Insufficient documentation

## 2022-07-17 DIAGNOSIS — S0990XA Unspecified injury of head, initial encounter: Secondary | ICD-10-CM | POA: Diagnosis present

## 2022-07-17 DIAGNOSIS — W01198A Fall on same level from slipping, tripping and stumbling with subsequent striking against other object, initial encounter: Secondary | ICD-10-CM | POA: Diagnosis not present

## 2022-07-17 DIAGNOSIS — D649 Anemia, unspecified: Secondary | ICD-10-CM | POA: Diagnosis not present

## 2022-07-17 DIAGNOSIS — W19XXXA Unspecified fall, initial encounter: Secondary | ICD-10-CM

## 2022-07-17 LAB — PROTIME-INR
INR: 1.1 (ref 0.8–1.2)
Prothrombin Time: 14 seconds (ref 11.4–15.2)

## 2022-07-17 LAB — BASIC METABOLIC PANEL
Anion gap: 8 (ref 5–15)
BUN: 28 mg/dL — ABNORMAL HIGH (ref 8–23)
CO2: 26 mmol/L (ref 22–32)
Calcium: 8.5 mg/dL — ABNORMAL LOW (ref 8.9–10.3)
Chloride: 100 mmol/L (ref 98–111)
Creatinine, Ser: 0.85 mg/dL (ref 0.44–1.00)
GFR, Estimated: >60 mL/min (ref >60)
Glucose, Bld: 170 mg/dL — ABNORMAL HIGH (ref 70–99)
Potassium: 3.3 mmol/L — ABNORMAL LOW (ref 3.5–5.1)
Sodium: 134 mmol/L — ABNORMAL LOW (ref 135–145)

## 2022-07-17 LAB — CBC
HCT: 32.3 % — ABNORMAL LOW (ref 36.0–46.0)
Hemoglobin: 10 g/dL — ABNORMAL LOW (ref 12.0–15.0)
MCH: 27.1 pg (ref 26.0–34.0)
MCHC: 31 g/dL (ref 30.0–36.0)
MCV: 87.5 fL (ref 80.0–100.0)
Platelets: 300 10*3/uL (ref 150–400)
RBC: 3.69 MIL/uL — ABNORMAL LOW (ref 3.87–5.11)
RDW: 15.1 % (ref 11.5–15.5)
WBC: 5.2 10*3/uL (ref 4.0–10.5)
nRBC: 0 % (ref 0.0–0.2)

## 2022-07-17 LAB — TROPONIN I (HIGH SENSITIVITY): Troponin I (High Sensitivity): 5 ng/L (ref <18)

## 2022-07-17 MED ORDER — SODIUM CHLORIDE 0.9 % IV BOLUS: 500.0000 mL | Freq: Once | INTRAVENOUS | Status: DC

## 2022-07-17 NOTE — ED Triage Notes (Signed)
Pt brought in via ems from [facility].  Pt fell when she stood up with caregiver per ems.  Pt has a hematoma to right forehead with abrasion.   No loc.  No vomiting.   Pt is on lovenox.  Pt alert.

## 2022-07-17 NOTE — Discharge Instructions (Signed)
Ms. Phillis had a CT scan of her head and neck which are not showing any significant injuries.  She is moving her right hip well.  Her blood work is normal.  She should return to the ER for any new or worsening pain, falls, change in mental status, or any other new or worsening symptoms that are concerning.

## 2022-07-17 NOTE — ED Notes (Signed)
EMS at bedside. Pt unable to sign pad.

## 2022-07-17 NOTE — ED Provider Notes (Signed)
[facility] Provider Note    Event Date/Time   First MD Initiated Contact with Patient 07/17/22 1810     (approximate)   History   Fall   HPI  Angel Cameron is a 82 y.o. female with history of dementia, hypertension, hyperlipidemia, hypothyroidism, GERD, and recent hip fracture status post arthroplasty who presents after fall.  Per EMS, the patient came from her nursing facility.  She fell after standing up with a caregiver.  She hit her head but did not lose consciousness.  The patient herself has dementia and is unable to provide any history.  I reviewed the medical records.  The patient was most recently admitted earlier this month.  Per the hospitalist discharge summary from 1/17 she presented with right hip pain after a fall and was diagnosed with a femoral neck fracture treated with ORIF.   Physical Exam   Triage Vital Signs: ED Triage Vitals  Enc Vitals Group     BP 07/17/22 1657 (!) 85/62     Pulse Rate 07/17/22 1657 61     Resp 07/17/22 1657 17     Temp 07/17/22 1657 97.6 F (36.4 C)     Temp Source 07/17/22 1657 Oral     SpO2 07/17/22 1657 96 %     Weight 07/17/22 1704 132 lb 4.4 oz (60 kg)     Height 07/17/22 1704 5\' 2"  (1.575 m)     Head Circumference --      Peak Flow --      Pain Score 07/17/22 1704 5     Pain Loc --      Pain Edu? --      Excl. in [place]? --     Most recent vital signs: Vitals:   07/17/22 1900 07/17/22 1920  BP: (!) 116/99 127/67  Pulse: 70 80  Resp: 16 18  Temp:    SpO2: 100% 96%     General: Alert, oriented x 1, frail appearing but in no acute distress. CV:  Good peripheral perfusion.  Resp:  Normal effort.  Lungs CTAB. Abd:  Soft and nontender.  No distention. Other:  Right forehead hematoma.  Full range of motion of neck.  No cervical spinal tenderness.  Normal range of motion of both hips and knees.  EOMI.  PERRLA.  Motor intact in all extremities.  No facial droop.  Somewhat dry mucous  membranes.   ED Results / Procedures / Treatments   Labs (all labs ordered are listed, but only abnormal results are displayed) Labs Reviewed  BASIC METABOLIC PANEL - Abnormal; Notable for the following components:      Result Value   Sodium 134 (*)    Potassium 3.3 (*)    Glucose, Bld 170 (*)    BUN 28 (*)    Calcium 8.5 (*)    All other components within normal limits  CBC - Abnormal; Notable for the following components:   RBC 3.69 (*)    Hemoglobin 10.0 (*)    HCT 32.3 (*)    All other components within normal limits  PROTIME-INR  TROPONIN I (HIGH SENSITIVITY)     EKG  ED ECG REPORT I, Arta Silence, the attending physician, personally viewed and interpreted this ECG.  Date: 07/17/2022 EKG Time: 1711 Rate: 67 Rhythm: normal sinus rhythm QRS Axis: normal Intervals: normal ST/T Wave abnormalities: normal Narrative Interpretation: no evidence of acute ischemia    RADIOLOGY  CT head: I independently viewed and interpreted the images;  there is no ICH.  Radiology report indicates no acute intracranial abnormality.  CT cervical spine: No acute fracture  PROCEDURES:  Critical Care performed: No  Procedures   MEDICATIONS ORDERED IN ED: Medications  sodium chloride 0.9 % bolus 500 mL (has no administration in time range)     IMPRESSION / MDM / ASSESSMENT AND PLAN / ED COURSE  I reviewed the triage vital signs and the nursing notes.  82 year old female with a history of dementia and other PMH as noted above presents after a fall from standing height with head injury but no LOC.  Physical exam is unremarkable for acute findings.  The patient's blood pressure is borderline low and she appears slightly dry.  Initial lab workup is unremarkable.  CBC shows chronic anemia.  Electrolytes are normal.  INR is normal.  Troponin is negative.  CT head and cervical spine are negative for acute findings.  Differential diagnosis includes, but is not limited to,  mechanical fall, vasovagal near syncope, orthostatic hypotension.  We will give a fluid bolus and reassess.  Patient's presentation is most consistent with acute presentation with potential threat to life or bodily function. The patient is on the cardiac monitor to evaluate for evidence of arrhythmia and/or significant heart rate changes.  ----------------------------------------- 7:24 PM on 07/17/2022 -----------------------------------------  The patient has now had several blood pressure readings which are normal without any intervention.  I suspect that the low blood pressure readings earlier were positional.  When I first saw the patient she was turned onto the right arm.  There is no clinical evidence of hypotension or hypoperfusion.  There is no evidence of UTI or other acute infection.  At this time the patient is stable for discharge back to her facility.  I provided return precautions.  FINAL CLINICAL IMPRESSION(S) / ED DIAGNOSES   Final diagnoses:  Minor head injury, initial encounter  Fall, initial encounter     Rx / DC Orders   ED Discharge Orders     None        Note:  This document was prepared using Dragon voice recognition software and may include unintentional dictation errors.    Arta Silence, MD 07/17/22 1924

## 2022-07-17 NOTE — ED Notes (Signed)
Report was called and pt's transport has been set up.

## 2022-07-29 ENCOUNTER — Encounter: Payer: Self-pay | Admitting: Emergency Medicine

## 2022-07-29 ENCOUNTER — Emergency Department
Admission: EM | Admit: 2022-07-29 | Discharge: 2022-07-29 | Disposition: A | Discharge status: Skilled Nursing Facility | Payer: Medicare HMO | Attending: Emergency Medicine | Admitting: Emergency Medicine

## 2022-07-29 ENCOUNTER — Emergency Department: Payer: Medicare HMO

## 2022-07-29 ENCOUNTER — Other Ambulatory Visit: Payer: Self-pay

## 2022-07-29 DIAGNOSIS — N390 Urinary tract infection, site not specified: Secondary | ICD-10-CM

## 2022-07-29 DIAGNOSIS — F0390 Unspecified dementia without behavioral disturbance: Secondary | ICD-10-CM

## 2022-07-29 DIAGNOSIS — I10 Essential (primary) hypertension: Secondary | ICD-10-CM | POA: Diagnosis not present

## 2022-07-29 DIAGNOSIS — Z96641 Presence of right artificial hip joint: Secondary | ICD-10-CM | POA: Diagnosis not present

## 2022-07-29 DIAGNOSIS — R296 Repeated falls: Secondary | ICD-10-CM

## 2022-07-29 DIAGNOSIS — W19XXXA Unspecified fall, initial encounter: Secondary | ICD-10-CM | POA: Insufficient documentation

## 2022-07-29 LAB — BASIC METABOLIC PANEL
Anion gap: 8 (ref 5–15)
BUN: 13 mg/dL (ref 8–23)
CO2: 30 mmol/L (ref 22–32)
Calcium: 9.2 mg/dL (ref 8.9–10.3)
Chloride: 99 mmol/L (ref 98–111)
Creatinine, Ser: 0.7 mg/dL (ref 0.44–1.00)
GFR, Estimated: >60 mL/min (ref >60)
Glucose, Bld: 170 mg/dL — ABNORMAL HIGH (ref 70–99)
Potassium: 3.9 mmol/L (ref 3.5–5.1)
Sodium: 137 mmol/L (ref 135–145)

## 2022-07-29 LAB — URINALYSIS, ROUTINE W REFLEX MICROSCOPIC
Bilirubin Urine: NEGATIVE
Glucose, UA: NEGATIVE mg/dL
Hgb urine dipstick: NEGATIVE
Ketones, ur: NEGATIVE mg/dL
Nitrite: NEGATIVE
Protein, ur: NEGATIVE mg/dL
Specific Gravity, Urine: 1.008 (ref 1.005–1.030)
pH: 7 (ref 5.0–8.0)

## 2022-07-29 LAB — CBC WITH DIFFERENTIAL/PLATELET
Abs Immature Granulocytes: 0.02 10*3/uL (ref 0.00–0.07)
Basophils Absolute: 0 10*3/uL (ref 0.0–0.1)
Basophils Relative: 1 %
Eosinophils Absolute: 0.1 10*3/uL (ref 0.0–0.5)
Eosinophils Relative: 2 %
HCT: 36.2 % (ref 36.0–46.0)
Hemoglobin: 11.2 g/dL — ABNORMAL LOW (ref 12.0–15.0)
Immature Granulocytes: 0 %
Lymphocytes Relative: 23 %
Lymphs Abs: 1.4 10*3/uL (ref 0.7–4.0)
MCH: 26.8 pg (ref 26.0–34.0)
MCHC: 30.9 g/dL (ref 30.0–36.0)
MCV: 86.6 fL (ref 80.0–100.0)
Monocytes Absolute: 0.4 10*3/uL (ref 0.1–1.0)
Monocytes Relative: 7 %
Neutro Abs: 4.3 10*3/uL (ref 1.7–7.7)
Neutrophils Relative %: 67 %
Platelets: 186 10*3/uL (ref 150–400)
RBC: 4.18 MIL/uL (ref 3.87–5.11)
RDW: 15.5 % (ref 11.5–15.5)
WBC: 6.4 10*3/uL (ref 4.0–10.5)
nRBC: 0 % (ref 0.0–0.2)

## 2022-07-29 MED ORDER — CEPHALEXIN 500 MG PO CAPS: 500.0000 mg | ORAL_CAPSULE | Freq: Two times a day (BID) | ORAL | 0 refills | Status: AC

## 2022-07-29 MED ORDER — CEPHALEXIN 500 MG PO CAPS
500.0000 mg | ORAL_CAPSULE | Freq: Once | ORAL | Status: AC
  Administered 2022-07-29: 500 mg via ORAL
  Filled 2022-07-29: qty 1

## 2022-07-29 MED ORDER — SODIUM CHLORIDE 0.9 % IV BOLUS
1000.0000 mL | Freq: Once | INTRAVENOUS | Status: AC
  Administered 2022-07-29: 1000 mL via INTRAVENOUS

## 2022-07-29 NOTE — ED Triage Notes (Signed)
Patient to ED via [facility] from [facility] for an unwitnessed fall. Per family, patient is less responsive than normal. Normally- alert, confused and very verbal. Patient appears in pain when right hip moved.

## 2022-07-29 NOTE — ED Provider Notes (Signed)
[facility] Provider Note    Event Date/Time   First MD Initiated Contact with Patient 07/29/22 1014     (approximate)   History   Chief Complaint: Fall   HPI  Angel Cameron is a 82 y.o. female with a history of hypertension, dementia, right hip replacement 1 month ago who was brought to the ED due to unwitnessed fall.  Also less interactive and responsive than normal.  Her baseline is alert, conversational, and confused.       Physical Exam   Triage Vital Signs: ED Triage Vitals  Enc Vitals Group     BP 07/29/22 1017 135/81     Pulse Rate 07/29/22 1017 93     Resp 07/29/22 1017 18     Temp 07/29/22 1017 (!) 97.3 F (36.3 C)     Temp Source 07/29/22 1017 Axillary     SpO2 07/29/22 1015 97 %     Weight --      Height --      Head Circumference --      Peak Flow --      Pain Score --      Pain Loc --      Pain Edu? --      Excl. in [place]? --     Most recent vital signs: Vitals:   07/29/22 1330 07/29/22 1431  BP: (!) 158/71   Pulse: 90   Resp: 19   Temp:  97.8 F (36.6 C)  SpO2: 92%     General: Awake, no distress.  Able to follow simple commands. CV:  Good peripheral perfusion.  Regular rate and rhythm.  Normal distal pulses Resp:  Normal effort.  Clear to auscultation bilaterally Abd:  No distention.  Soft nontender Other:  Tenderness at the right hip with palpation.  Right leg is internally rotated with decreased passive range of motion at the right hip.   ED Results / Procedures / Treatments   Labs (all labs ordered are listed, but only abnormal results are displayed) Labs Reviewed  BASIC METABOLIC PANEL - Abnormal; Notable for the following components:      Result Value   Glucose, Bld 170 (*)    All other components within normal limits  CBC WITH DIFFERENTIAL/PLATELET - Abnormal; Notable for the following components:   Hemoglobin 11.2 (*)    All other components within normal limits  URINALYSIS, ROUTINE W REFLEX  MICROSCOPIC     EKG Interpreted by me Atrial fibrillation, rate of 95.  Right axis, poor R wave progression.  Normal ST segments and T waves   RADIOLOGY X-ray pelvis and right hip interpreted by me, negative for dislocation or fracture.  Radiology report reviewed.  Chest x-ray unremarkable  CT head and cervical spine unremarkable   PROCEDURES:  Procedures   MEDICATIONS ORDERED IN ED: Medications  sodium chloride 0.9 % bolus 1,000 mL (0 mLs Intravenous Stopped 07/29/22 1431)     IMPRESSION / MDM / ASSESSMENT AND PLAN / ED COURSE  I reviewed the triage vital signs and the nursing notes.  DDx: Right hip dislocation, right hip fracture, pelvis fracture, intracranial hemorrhage, C-spine fracture, UTI, AKI, electrolyte abnormality, anemia  Patient's presentation is most consistent with acute presentation with potential threat to life or bodily function.  Patient presents with altered mental status, unwitnessed fall, and clinically suspected right hip injury.  Will obtain x-rays, CT head and cervical spine, labs.   ----------------------------------------- 3:14 PM on 07/29/2022 ----------------------------------------- CTs and chest x-ray  and hip and pelvis x-ray all unremarkable.  Serum labs unremarkable.  Will follow-up urinalysis, anticipate discharge back to facility.      FINAL CLINICAL IMPRESSION(S) / ED DIAGNOSES   Final diagnoses:  Unwitnessed fall  Chronic dementia ([place])     Rx / DC Orders   ED Discharge Orders     None        Note:  This document was prepared using Dragon voice recognition software and may include unintentional dictation errors.   Carrie Mew, MD 07/29/22 1515

## 2022-07-29 NOTE — ED Notes (Signed)
Patient to CT at this time.

## 2022-07-29 NOTE — ED Provider Notes (Signed)
-----------------------------------------   4:26 PM on 07/29/2022 -----------------------------------------  I took over care of this patient from Dr. Joni Fears.  Urinalysis shows findings consistent with possible UTI which could explain the patient's mental status.  However, the patient is otherwise stable with normal vital signs and reassuring labs.  She is appropriate for discharge back to her facility.  I have ordered a dose of Keflex here and a prescription for the same to be given for the next week.  I counseled her family member on the results of the workup, plan of care, and return precautions, and she expressed understanding.   Arta Silence, MD 07/29/22 1627

## 2022-10-02 ENCOUNTER — Other Ambulatory Visit: Payer: Self-pay

## 2022-10-02 DIAGNOSIS — F0390 Unspecified dementia without behavioral disturbance: Secondary | ICD-10-CM | POA: Insufficient documentation

## 2022-10-02 DIAGNOSIS — W01198A Fall on same level from slipping, tripping and stumbling with subsequent striking against other object, initial encounter: Secondary | ICD-10-CM | POA: Insufficient documentation

## 2022-10-02 DIAGNOSIS — Y92129 Unspecified place in nursing home as the place of occurrence of the external cause: Secondary | ICD-10-CM | POA: Diagnosis not present

## 2022-10-02 DIAGNOSIS — S0990XA Unspecified injury of head, initial encounter: Secondary | ICD-10-CM | POA: Diagnosis present

## 2022-10-02 DIAGNOSIS — S0083XA Contusion of other part of head, initial encounter: Secondary | ICD-10-CM | POA: Insufficient documentation

## 2022-10-02 DIAGNOSIS — I10 Essential (primary) hypertension: Secondary | ICD-10-CM | POA: Insufficient documentation

## 2022-10-02 NOTE — ED Triage Notes (Signed)
Pt from [facility] with unwitnessed fall. Pt found on floor of facility, awake, pt with history of dementia per ems. Pt alert to self only at this time. Pt with large hematoma noted to right forehead, will not leave rigid c collar inplace.

## 2022-10-03 ENCOUNTER — Emergency Department
Admission: EM | Admit: 2022-10-03 | Discharge: 2022-10-03 | Disposition: A | Discharge status: Skilled Nursing Facility | Payer: Medicare HMO | Attending: Emergency Medicine | Admitting: Emergency Medicine

## 2022-10-03 ENCOUNTER — Emergency Department: Payer: Medicare HMO

## 2022-10-03 DIAGNOSIS — S0990XA Unspecified injury of head, initial encounter: Secondary | ICD-10-CM

## 2022-10-03 NOTE — Discharge Instructions (Addendum)
Please use ice to the forehead for 20 to 30 minutes every 2 hours so to help reduce swelling.  Please use Tylenol 1000 mg every 6 hours as needed for discomfort.  Return to the emergency department for any symptom personally concerning to yourself.  Your CT scans of the head and neck were negative for significant abnormality.

## 2022-10-03 NOTE — ED Provider Notes (Signed)
   [facility] Provider Note    Event Date/Time   First MD Initiated Contact with Patient 10/03/22 [phone]     (approximate)  History   Chief Complaint: Head Injury  HPI  Angel Cameron is a 82 y.o. female with a past medical history of hypertension who presents to the emergency department for head injury.  According to report patient lives at [facility], has dementia, tripped falling forward hitting her head.  Patient with large hematoma to the right forehead.  No reported LOC.  No reported anticoagulation.  Physical Exam   Triage Vital Signs: ED Triage Vitals [10/02/22 2356]  Enc Vitals Group     BP 133/76     Pulse Rate 72     Resp 16     Temp (!) 97.4 F (36.3 C)     Temp Source Axillary     SpO2 95 %     Weight 130 lb (59 kg)     Height  (1.6 m)     Head Circumference      Peak Flow      Pain Score      Pain Loc      Pain Edu?      Excl. in [place]?     Most recent vital signs: Vitals:   10/02/22 2356  BP: 133/76  Pulse: 72  Resp: 16  Temp: (!) 97.4 F (36.3 C)  SpO2: 95%    General: Awake, no distress.  Moderate sized hematoma to the right forehead.  Mild amount of ecchymosis around the right forehead coming down to the right periorbital area CV:  Good peripheral perfusion.  Regular rate and rhythm  Resp:  Normal effort.  Equal breath sounds bilaterally.  Abd:  No distention.  Soft, nontender.  ED Results / Procedures / Treatments   RADIOLOGY  I have reviewed and interpreted the CT images.  Patient has a moderate size hematoma to the right forehead but no fracture and no intracranial abnormality. C-spine is negative  MEDICATIONS ORDERED IN ED: Medications - No data to display   IMPRESSION / MDM / ASSESSMENT AND PLAN / ED COURSE  I reviewed the triage vital signs and the nursing notes.  Patient's presentation is most consistent with acute illness / injury with system symptoms.  Patient presents emergency department  after a head injury.  Patient appears well, she is calm cooperative pleasant.  Has a history of dementia.  Does have a moderate size right forehead hematoma but no bleeding.  CT scan of the head and C-spine are negative for acute abnormality besides hematoma.  Discussed with the patient to ice the area for 20 minutes every 2 hours or so to reduce swelling use of Tylenol as needed for discomfort.  Patient and daughter agreeable to plan of care.  FINAL CLINICAL IMPRESSION(S) / ED DIAGNOSES   Head injury    Note:  This document was prepared using Dragon voice recognition software and may include unintentional dictation errors.   Minna Antis, MD 10/03/22 [phone]

## 2023-03-20 ENCOUNTER — Other Ambulatory Visit: Payer: Self-pay

## 2023-03-20 ENCOUNTER — Encounter (HOSPITAL_COMMUNITY): Payer: Self-pay

## 2023-03-20 ENCOUNTER — Emergency Department (HOSPITAL_COMMUNITY)
Admission: EM | Admit: 2023-03-20 | Discharge: 2023-03-21 | Disposition: A | Discharge status: Home / Self Care | Payer: Medicare HMO

## 2023-03-20 ENCOUNTER — Emergency Department (HOSPITAL_COMMUNITY): Payer: Medicare HMO

## 2023-03-20 DIAGNOSIS — Z79899 Other long term (current) drug therapy: Secondary | ICD-10-CM | POA: Diagnosis not present

## 2023-03-20 DIAGNOSIS — I10 Essential (primary) hypertension: Secondary | ICD-10-CM | POA: Insufficient documentation

## 2023-03-20 DIAGNOSIS — W01198A Fall on same level from slipping, tripping and stumbling with subsequent striking against other object, initial encounter: Secondary | ICD-10-CM | POA: Diagnosis not present

## 2023-03-20 DIAGNOSIS — F0390 Unspecified dementia without behavioral disturbance: Secondary | ICD-10-CM | POA: Insufficient documentation

## 2023-03-20 DIAGNOSIS — S0990XA Unspecified injury of head, initial encounter: Secondary | ICD-10-CM | POA: Diagnosis present

## 2023-03-20 DIAGNOSIS — Y92129 Unspecified place in nursing home as the place of occurrence of the external cause: Secondary | ICD-10-CM | POA: Insufficient documentation

## 2023-03-20 DIAGNOSIS — W19XXXA Unspecified fall, initial encounter: Secondary | ICD-10-CM

## 2023-03-20 LAB — BASIC METABOLIC PANEL
Anion gap: 9 (ref 5–15)
BUN: 19 mg/dL (ref 8–23)
CO2: 29 mmol/L (ref 22–32)
Calcium: 8.5 mg/dL — ABNORMAL LOW (ref 8.9–10.3)
Chloride: 97 mmol/L — ABNORMAL LOW (ref 98–111)
Creatinine, Ser: 0.99 mg/dL (ref 0.44–1.00)
GFR, Estimated: 56 mL/min — ABNORMAL LOW (ref >60)
Glucose, Bld: 287 mg/dL — ABNORMAL HIGH (ref 70–99)
Potassium: 4 mmol/L (ref 3.5–5.1)
Sodium: 135 mmol/L (ref 135–145)

## 2023-03-20 LAB — CBC
HCT: 30.1 % — ABNORMAL LOW (ref 36.0–46.0)
Hemoglobin: 9.4 g/dL — ABNORMAL LOW (ref 12.0–15.0)
MCH: 28.4 pg (ref 26.0–34.0)
MCHC: 31.2 g/dL (ref 30.0–36.0)
MCV: 90.9 fL (ref 80.0–100.0)
Platelets: 244 10*3/uL (ref 150–400)
RBC: 3.31 MIL/uL — ABNORMAL LOW (ref 3.87–5.11)
RDW: 13.9 % (ref 11.5–15.5)
WBC: 4.3 10*3/uL (ref 4.0–10.5)
nRBC: 0 % (ref 0.0–0.2)

## 2023-03-20 NOTE — ED Provider Notes (Signed)
[place] EMERGENCY DEPARTMENT AT [facility] Provider Note   CSN: [id number] Arrival date & time: 03/20/23  1914     History {Add pertinent medical, surgical, social history, OB history to HPI:1} No chief complaint on file.   Angel Cameron is a 82 y.o. female.  82 year old female with past medical history of of hypertension and dementia presenting to the emergency department from her nursing facility after a fall at her nursing home.  The patient had a witnessed fall.  This was a mechanical fall.  She apparently fell and hit her head.  They report that she is normally alert and able to ambulate on her own.  She did have a 3-minute loss of consciousness after falling and hitting her head.  She was brought to the ER at that time for further evaluation.        Home Medications Prior to Admission medications   Medication Sig Start Date End Date Taking? Authorizing Provider  atorvastatin (LIPITOR) 10 MG tablet Take 10 mg by mouth daily.    [provider]  benazepril (LOTENSIN) 40 MG tablet Take 40 mg by mouth 2 (two) times daily.    [provider]  bisacodyl (DULCOLAX) 10 MG suppository Place 1 suppository (10 mg total) rectally daily as needed for moderate constipation. 07/04/22   Kirby Crigler, Mir M, MD  docusate sodium (COLACE) 100 MG capsule Take 1 capsule (100 mg total) by mouth 2 (two) times daily. 07/04/22   Kirby Crigler, Mir M, MD  donepezil (ARICEPT) 5 MG tablet Take 5 mg by mouth at bedtime.    [provider]  enoxaparin (LOVENOX) 40 MG/0.4ML injection Inject 0.4 mLs (40 mg total) into the skin daily for 21 days. 07/05/22 07/26/22  Kirby Crigler, Mir M, MD  FLUoxetine (PROZAC) 10 MG capsule Take 10 mg by mouth daily. 05/24/22   [provider]  HYDROcodone-acetaminophen (NORCO/VICODIN) 5-325 MG tablet Take 1-2 tablets by mouth every 4 (four) hours as needed for moderate pain (pain score 4-6). 07/04/22   Kirby Crigler, Mir M, MD  levothyroxine  (SYNTHROID, LEVOTHROID) 75 MCG tablet Take 75 mcg by mouth daily before breakfast.    [provider]  meclizine (ANTIVERT) 25 MG tablet Take 0.5 tablets (12.5 mg total) by mouth 2 (two) times daily as needed for dizziness. 09/02/19   Sharman Cheek, MD  memantine (NAMENDA) 5 MG tablet Take 5 mg by mouth daily.    [provider]  metoprolol succinate (TOPROL-XL) 50 MG 24 hr tablet Take 50 mg by mouth daily. 05/24/22   [provider]  Multiple Vitamin (MULTIVITAMIN WITH MINERALS) TABS tablet Take 1 tablet by mouth daily.    [provider]  Nutritional Supplements (,FEEDING SUPPLEMENT, PROSOURCE PLUS) liquid Take 30 mLs by mouth 2 (two) times daily between meals. 07/04/22   Kirby Crigler, Mir M, MD  omeprazole (PRILOSEC) 20 MG capsule Take 20 mg by mouth daily.    [provider]  traZODone (DESYREL) 50 MG tablet Take 1 tablet (50 mg total) by mouth at bedtime as needed for sleep. 07/04/22   Maryln Gottron, MD      Allergies    Patient has no known allergies.    Review of Systems   Review of Systems  Reason unable to perform ROS: Dementia/confusion since fall.    Physical Exam Updated Vital Signs There were no vitals taken for this visit. Physical Exam Vitals and nursing note reviewed.   Gen: NAD Eyes: PERRL, EOMI, there is right periorbital swelling  and bruising noted, no hemotympanum, no raccoon eyes, no Battle sign HEENT: no oropharyngeal swelling Neck: trachea midline, no cervical spine tenderness, no stepoffs or deformities Resp: clear to auscultation bilaterally Card: RRR, no murmurs, rubs, or gallops Abd: nontender, nondistended, no seatbelt sign Extremities: no calf tenderness, no edema MSK: no thoracic spinal tenderness, no lumbar spinal tenderness, no step-offs or deformities Vascular: 2+ radial pulses bilaterally, 2+ DP pulses bilaterally Neuro: Oriented x 1, will arouse to verbal stimuli, moving extremities spontaneously,  equal strength sensation throughout bilateral upper and lower extremities Skin: no rashes   ED Results / Procedures / Treatments   Labs (all labs ordered are listed, but only abnormal results are displayed) Labs Reviewed  CBC  BASIC METABOLIC PANEL    EKG None  Radiology No results found.  Procedures Procedures  {Document cardiac monitor, telemetry assessment procedure when appropriate:1}  Medications Ordered in ED Medications - No data to display  ED Course/ Medical Decision Making/ A&P   {   Click here for ABCD2, HEART and other calculatorsREFRESH Note before signing :1}                              Medical Decision Making 82 year old female with past medical history of hypertension and dementia presenting to the emergency department today after a fall at her nursing home.  I will obtain basic labs here and obtain a CT scan of the patient's head, maxillofacial bones, and cervical spine in addition to a chest x-ray for further evaluation for acute traumatic injuries.  7 due to concussion but this is a diagnosis of exclusion obviously.  There is concern for intracranial hemorrhage potentially.  I will reevaluate for ultimate disposition.  Amount and/or Complexity of Data Reviewed Labs: ordered. Radiology: ordered.   ***  {Document critical care time when appropriate:1} {Document review of labs and clinical decision tools ie heart score, Chads2Vasc2 etc:1}  {Document your independent review of radiology images, and any outside records:1} {Document your discussion with family members, caretakers, and with consultants:1} {Document social determinants of health affecting pt's care:1} {Document your decision making why or why not admission, treatments were needed:1} Final Clinical Impression(s) / ED Diagnoses Final diagnoses:  None    Rx / DC Orders ED Discharge Orders     None

## 2023-03-20 NOTE — ED Triage Notes (Signed)
Pt fell at [facility], witnessed fall and she hit the rt side of her head on the floor. Did have a 3 minute loc per staff and since then she has been very sleepy. Will arouse to stimulation but is quick to go back to sleep. Normally walks with walker, verbal but has dementia.

## 2023-03-21 NOTE — ED Provider Notes (Signed)
  Provider Note MRN:  [id number]  Arrival date & time: 03/21/23    ED Course and Medical Decision Making  Assumed care from Dr Rhae Hammock at shift change.  See note from prior team for complete details, in brief:  82 yo female Fall at SNF F/u imaging [facility] will take back  Plan per prior physician f/u imaging  Imaging reviewed, no fracture.  She is feeling better, plan discharge back to SNF, concussion precautions discussed   The patient improved significantly and was discharged in stable condition. Detailed discussions were had with the patient regarding current findings, and need for close f/u with PCP or on call doctor. The patient has been instructed to return immediately if the symptoms worsen in any way for re-evaluation. Patient verbalized understanding and is in agreement with current care plan. All questions answered prior to discharge.    Procedures  Final Clinical Impressions(s) / ED Diagnoses     ICD-10-CM   1. Fall, initial encounter  W19.XXXA     2. Closed head injury, initial encounter  S09.90XA       ED Discharge Orders     None         Discharge Instructions      Your workup today was reassuring.  Please follow up with your doctor and return to the emergency department for worsening symptoms.        Sloan Leiter, DO 03/21/23 0207

## 2023-03-21 NOTE — Discharge Instructions (Addendum)
Your workup today was reassuring.  Please follow-up with your doctor and return to the emergency department for worsening symptoms.

## 2023-03-29 ENCOUNTER — Emergency Department: Payer: Medicare HMO

## 2023-03-29 ENCOUNTER — Encounter: Payer: Self-pay | Admitting: Emergency Medicine

## 2023-03-29 ENCOUNTER — Other Ambulatory Visit: Payer: Self-pay

## 2023-03-29 ENCOUNTER — Inpatient Hospital Stay
Admission: EM | Admit: 2023-03-29 | Discharge: 2023-03-30 | DRG: 872 | Disposition: A | Discharge status: Other Acute Inpatient Hospital | Payer: Medicare HMO | Attending: Internal Medicine | Admitting: Internal Medicine

## 2023-03-29 DIAGNOSIS — Z1152 Encounter for screening for COVID-19: Secondary | ICD-10-CM

## 2023-03-29 DIAGNOSIS — A4151 Sepsis due to Escherichia coli [E. coli]: Secondary | ICD-10-CM | POA: Diagnosis not present

## 2023-03-29 DIAGNOSIS — K8070 Calculus of gallbladder and bile duct without cholecystitis without obstruction: Secondary | ICD-10-CM | POA: Diagnosis present

## 2023-03-29 DIAGNOSIS — K8050 Calculus of bile duct without cholangitis or cholecystitis without obstruction: Secondary | ICD-10-CM | POA: Diagnosis present

## 2023-03-29 DIAGNOSIS — R7881 Bacteremia: Secondary | ICD-10-CM

## 2023-03-29 DIAGNOSIS — I10 Essential (primary) hypertension: Secondary | ICD-10-CM | POA: Diagnosis present

## 2023-03-29 DIAGNOSIS — E039 Hypothyroidism, unspecified: Secondary | ICD-10-CM | POA: Diagnosis present

## 2023-03-29 DIAGNOSIS — F0390 Unspecified dementia without behavioral disturbance: Secondary | ICD-10-CM | POA: Diagnosis present

## 2023-03-29 DIAGNOSIS — E785 Hyperlipidemia, unspecified: Secondary | ICD-10-CM | POA: Diagnosis present

## 2023-03-29 DIAGNOSIS — R64 Cachexia: Secondary | ICD-10-CM | POA: Diagnosis present

## 2023-03-29 DIAGNOSIS — N390 Urinary tract infection, site not specified: Principal | ICD-10-CM | POA: Diagnosis present

## 2023-03-29 LAB — URINALYSIS, W/ REFLEX TO CULTURE (INFECTION SUSPECTED)
Bilirubin Urine: NEGATIVE
Glucose, UA: NEGATIVE mg/dL
Hgb urine dipstick: NEGATIVE
Ketones, ur: NEGATIVE mg/dL
Nitrite: NEGATIVE
Protein, ur: NEGATIVE mg/dL
Specific Gravity, Urine: 1.017 (ref 1.005–1.030)
WBC, UA: >50 WBC/hpf (ref 0–5)
pH: 6 (ref 5.0–8.0)

## 2023-03-29 LAB — CBC WITH DIFFERENTIAL/PLATELET
Abs Immature Granulocytes: 0.02 10*3/uL (ref 0.00–0.07)
Basophils Absolute: 0 10*3/uL (ref 0.0–0.1)
Basophils Relative: 0 %
Eosinophils Absolute: 0 10*3/uL (ref 0.0–0.5)
Eosinophils Relative: 0 %
HCT: 32 % — ABNORMAL LOW (ref 36.0–46.0)
Hemoglobin: 10.2 g/dL — ABNORMAL LOW (ref 12.0–15.0)
Immature Granulocytes: 0 %
Lymphocytes Relative: 6 %
Lymphs Abs: 0.5 10*3/uL — ABNORMAL LOW (ref 0.7–4.0)
MCH: 28.4 pg (ref 26.0–34.0)
MCHC: 31.9 g/dL (ref 30.0–36.0)
MCV: 89.1 fL (ref 80.0–100.0)
Monocytes Absolute: 0.5 10*3/uL (ref 0.1–1.0)
Monocytes Relative: 7 %
Neutro Abs: 6.5 10*3/uL (ref 1.7–7.7)
Neutrophils Relative %: 87 %
Platelets: 201 10*3/uL (ref 150–400)
RBC: 3.59 MIL/uL — ABNORMAL LOW (ref 3.87–5.11)
RDW: 14 % (ref 11.5–15.5)
WBC: 7.5 10*3/uL (ref 4.0–10.5)
nRBC: 0 % (ref 0.0–0.2)

## 2023-03-29 LAB — COMPREHENSIVE METABOLIC PANEL
ALT: 162 U/L — ABNORMAL HIGH (ref 0–44)
AST: 434 U/L — ABNORMAL HIGH (ref 15–41)
Albumin: 3.3 g/dL — ABNORMAL LOW (ref 3.5–5.0)
Alkaline Phosphatase: 997 U/L — ABNORMAL HIGH (ref 38–126)
Anion gap: 9 (ref 5–15)
BUN: 24 mg/dL — ABNORMAL HIGH (ref 8–23)
CO2: 27 mmol/L (ref 22–32)
Calcium: 8.7 mg/dL — ABNORMAL LOW (ref 8.9–10.3)
Chloride: 100 mmol/L (ref 98–111)
Creatinine, Ser: 0.76 mg/dL (ref 0.44–1.00)
GFR, Estimated: >60 mL/min (ref >60)
Glucose, Bld: 212 mg/dL — ABNORMAL HIGH (ref 70–99)
Potassium: 4.2 mmol/L (ref 3.5–5.1)
Sodium: 136 mmol/L (ref 135–145)
Total Bilirubin: 2.7 mg/dL — ABNORMAL HIGH (ref 0.3–1.2)
Total Protein: 6.4 g/dL — ABNORMAL LOW (ref 6.5–8.1)

## 2023-03-29 LAB — RESP PANEL BY RT-PCR (RSV, FLU A&B, COVID)  RVPGX2
Influenza A by PCR: NEGATIVE
Influenza B by PCR: NEGATIVE
Resp Syncytial Virus by PCR: NEGATIVE
SARS Coronavirus 2 by RT PCR: NEGATIVE

## 2023-03-29 LAB — PROTIME-INR
INR: 1.1 (ref 0.8–1.2)
Prothrombin Time: 14.7 s (ref 11.4–15.2)

## 2023-03-29 LAB — LACTIC ACID, PLASMA: Lactic Acid, Venous: 2.1 mmol/L (ref 0.5–1.9)

## 2023-03-29 MED ORDER — SODIUM CHLORIDE 0.9 % IV SOLN
2.0000 g | INTRAVENOUS | Status: DC
  Administered 2023-03-29: 2 g via INTRAVENOUS
  Filled 2023-03-29: qty 20

## 2023-03-29 MED ORDER — LACTATED RINGERS IV SOLN: INTRAVENOUS | Status: DC

## 2023-03-29 MED ORDER — LACTATED RINGERS IV BOLUS (SEPSIS)
1000.0000 mL | Freq: Once | INTRAVENOUS | Status: AC
  Administered 2023-03-29: 1000 mL via INTRAVENOUS

## 2023-03-29 NOTE — Progress Notes (Signed)
CODE SEPSIS - PHARMACY COMMUNICATION  **Broad Spectrum Antibiotics should be administered within 1 hour of Sepsis diagnosis**  Time Code Sepsis Called/Page Received: 21:31  Antibiotics Ordered: Ceftriaxone  Time of 1st antibiotic administration: 21:55  Additional action taken by pharmacy: n/a  If necessary, Name of Provider/Nurse Contacted: n/a    Foye Deer ,PharmD Clinical Pharmacist  03/29/2023  10:04 PM

## 2023-03-29 NOTE — Sepsis Progress Note (Signed)
Elink monitoring for the code sepsis protocol.  

## 2023-03-29 NOTE — ED Triage Notes (Signed)
Pt via [facility] from [facility]. Per staff, pt was fine this morning acting her normal self. Per daughter, pt was exposed to COVID 2 weeks ago. Pt also have a foul odor to her urine. Pt has a hx of dementia daughter states she seems a little more confused, pt is resistant and not cooperative.   Attempted blood work at this time. Unsuccessful attempt

## 2023-03-29 NOTE — Sepsis Progress Note (Addendum)
Notified Bedside RN of need to draw repeat lactic acid.

## 2023-03-29 NOTE — ED Notes (Signed)
Rad at bedside.

## 2023-03-29 NOTE — ED Notes (Signed)
Pt comes via [facility] from [facility] with c/o fever and 1 episode of vomiting. Pt did have ax temp of 102.2 per EMS. BP-155/72

## 2023-03-29 NOTE — ED Provider Notes (Signed)
[facility] Provider Note   Event Date/Time   First MD Initiated Contact with Patient 03/29/23 1755     (approximate) History  Fever  HPI Angel Cameron is a 82 y.o. female who presents for long-term care facility with a past medical history of dementia who presents with daughter complaining of altered mental status.  Patient is altered at baseline however daughter states that that she has become more confused over the last 24 hours.  Patient was also found to have a fever ROS: Patient currently denies any vision changes, tinnitus, difficulty speaking, facial droop, sore throat, chest pain, shortness of breath, abdominal pain, nausea/vomiting/diarrhea, dysuria, or weakness/numbness/paresthesias in any extremity   Physical Exam  Triage Vital Signs: ED Triage Vitals  Encounter Vitals Group     BP 03/29/23 1723 (!) 117/55     Systolic BP Percentile --      Diastolic BP Percentile --      Pulse Rate 03/29/23 1723 (!) 111     Resp 03/29/23 1723 (!) 24     Temp 03/29/23 1723 (!) 103.1 F (39.5 C)     Temp Source 03/29/23 1723 Rectal     SpO2 03/29/23 1723 98 %     Weight 03/29/23 1710 125 lb (56.7 kg)     Height 03/29/23 1710 5\' 3"  (1.6 m)     Head Circumference --      Peak Flow --      Pain Score --      Pain Loc --      Pain Education --      Exclude from Growth Chart --    Most recent vital signs: Vitals:   03/30/23 1431 03/30/23 1521  BP: 139/80 132/69  Pulse: 83 66  Resp: 18 16  Temp: 98.4 F (36.9 C) 98.4 F (36.9 C)  SpO2: 98% 94%   General: Awake, cooperative CV:  Good peripheral perfusion.  Resp:  Normal effort.  Abd:  No distention.  Other:  Elderly cachectic African-American female resting comfortably in no acute distress.  Confused, GCS 14 ED Results / Procedures / Treatments  Labs (all labs ordered are listed, but only abnormal results are displayed) Labs Reviewed  BLOOD CULTURE ID PANEL (REFLEXED) - BCID2 - Abnormal; Notable  for the following components:      Result Value   Enterobacterales DETECTED (*)    Escherichia coli DETECTED (*)    All other components within normal limits  COMPREHENSIVE METABOLIC PANEL - Abnormal; Notable for the following components:   Glucose, Bld 212 (*)    BUN 24 (*)    Calcium 8.7 (*)    Total Protein 6.4 (*)    Albumin 3.3 (*)    AST 434 (*)    ALT 162 (*)    Alkaline Phosphatase 997 (*)    Total Bilirubin 2.7 (*)    All other components within normal limits  LACTIC ACID, PLASMA - Abnormal; Notable for the following components:   Lactic Acid, Venous 2.1 (*)    All other components within normal limits  CBC WITH DIFFERENTIAL/PLATELET - Abnormal; Notable for the following components:   RBC 3.59 (*)    Hemoglobin 10.2 (*)    HCT 32.0 (*)    Lymphs Abs 0.5 (*)    All other components within normal limits  URINALYSIS, W/ REFLEX TO CULTURE (INFECTION SUSPECTED) - Abnormal; Notable for the following components:   Color, Urine AMBER (*)    APPearance HAZY (*)  Leukocytes,Ua LARGE (*)    Bacteria, UA FEW (*)    All other components within normal limits  CULTURE, BLOOD (ROUTINE X 2)  CULTURE, BLOOD (ROUTINE X 2)  RESP PANEL BY RT-PCR (RSV, FLU A&B, COVID)  RVPGX2  URINE CULTURE  LACTIC ACID, PLASMA  PROTIME-INR   EKG ED ECG REPORT I, Merwyn Katos, the attending physician, personally viewed and interpreted this ECG. Date: 03/29/2023 EKG Time: 1734 Rate: 99 Rhythm: normal sinus rhythm QRS Axis: normal Intervals: normal ST/T Wave abnormalities: normal Narrative Interpretation: no evidence of acute ischemia RADIOLOGY ED MD interpretation: CT of the head without contrast interpreted by me shows no evidence of acute abnormalities including no intracerebral hemorrhage, obvious masses, or significant edema  One-view portable chest x-ray interpreted by me shows no evidence of acute abnormalities including no pneumonia, pneumothorax, or widened mediastinum  Right upper  quadrant ultrasound pending -Agree with radiology assessment Official radiology report(s): MR ABDOMEN MRCP W WO CONTAST  Result Date: 03/30/2023 CLINICAL DATA:  82 year old female history of cholelithiasis and possible tumefactive sludge in the gallbladder lumen. EXAM: MRI ABDOMEN WITHOUT AND WITH CONTRAST (INCLUDING MRCP) TECHNIQUE: Multiplanar multisequence MR imaging of the abdomen was performed both before and after the administration of intravenous contrast. Heavily T2-weighted images of the biliary and pancreatic ducts were obtained, and three-dimensional MRCP images were rendered by post processing. CONTRAST:  6mL GADAVIST GADOBUTROL 1 MMOL/ML IV SOLN COMPARISON:  No prior abdominal MRI. Abdominal ultrasound 03/29/2023. FINDINGS: Lower chest: Unremarkable. Hepatobiliary: Liver contour is mildly irregular, suggesting a background of cirrhosis. No suspicious cystic or solid hepatic lesions. Mild intrahepatic biliary ductal dilatation. Common bile duct is also dilated measuring up to 10 mm in the porta hepatis proximally. Several filling defects are noted in the distal common bile duct, measuring up to 9 mm. Cystic duct is not well visualized, but small filling defects in the cystic duct suggest probable stones in this cystic duct. Small filling defects are also noted lying dependently in the gallbladder, indicative of cholelithiasis. Gallbladder is moderately distended. Gallbladder wall appears mildly thickened and edematous. Pancreas: No pancreatic mass. No pancreatic ductal dilatation. No pancreatic or peripancreatic fluid collections or inflammatory changes. Spleen: Spleen is enlarged measuring 12.9 x 9.4 x 16.7 cm (estimated splenic volume of 1,013 mL). Adrenals/Urinary Tract: Several subcentimeter T1 hypointense, T2 hyperintense, nonenhancing lesions in both kidneys are compatible with tiny simple cysts (Bosniak class 1, no imaging follow-up recommended). No aggressive appearing renal lesions are  noted. No hydroureteronephrosis in the visualized portions of the abdomen. Bilateral adrenal glands are normal in appearance. Stomach/Bowel: Visualized portions are unremarkable. Vascular/Lymphatic: No aneurysm identified in the visualized abdominal vasculature. Dilated portal vein (16 mm in diameter). No lymphadenopathy noted in the abdomen. Other: No significant volume of ascites noted in the visualized portions of the peritoneal cavity. Musculoskeletal: No aggressive appearing osseous lesions are noted in the visualized portions of the skeleton. IMPRESSION: 1. Study is positive for cholelithiasis and choledocholithiasis. Several obstructing stones are noted in the distal common bile duct and there is associated proximal intra and extrahepatic biliary ductal dilatation. There is also a moderately distended gallbladder with mild gallbladder wall thickening and edema and several stones which appear likely impacted in the region of the cystic duct. Findings are concerning for early acute cholecystitis. Surgical consultation is recommended. 2. Morphologic changes in the liver suggesting early cirrhosis. 3. 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 Electronically Signed   By: Brayton Mars.D.  On: 03/30/2023 08:58   MR 3D Recon At Scanner  Result Date: 03/30/2023 CLINICAL DATA:  82 year old female history of cholelithiasis and possible tumefactive sludge in the gallbladder lumen. EXAM: MRI ABDOMEN WITHOUT AND WITH CONTRAST (INCLUDING MRCP) TECHNIQUE: Multiplanar multisequence MR imaging of the abdomen was performed both before and after the administration of intravenous contrast. Heavily T2-weighted images of the biliary and pancreatic ducts were obtained, and three-dimensional MRCP images were rendered by post processing. CONTRAST:  6mL GADAVIST GADOBUTROL 1 MMOL/ML IV SOLN COMPARISON:  No prior abdominal MRI. Abdominal ultrasound 03/29/2023. FINDINGS:  Lower chest: Unremarkable. Hepatobiliary: Liver contour is mildly irregular, suggesting a background of cirrhosis. No suspicious cystic or solid hepatic lesions. Mild intrahepatic biliary ductal dilatation. Common bile duct is also dilated measuring up to 10 mm in the porta hepatis proximally. Several filling defects are noted in the distal common bile duct, measuring up to 9 mm. Cystic duct is not well visualized, but small filling defects in the cystic duct suggest probable stones in this cystic duct. Small filling defects are also noted lying dependently in the gallbladder, indicative of cholelithiasis. Gallbladder is moderately distended. Gallbladder wall appears mildly thickened and edematous. Pancreas: No pancreatic mass. No pancreatic ductal dilatation. No pancreatic or peripancreatic fluid collections or inflammatory changes. Spleen: Spleen is enlarged measuring 12.9 x 9.4 x 16.7 cm (estimated splenic volume of 1,013 mL). Adrenals/Urinary Tract: Several subcentimeter T1 hypointense, T2 hyperintense, nonenhancing lesions in both kidneys are compatible with tiny simple cysts (Bosniak class 1, no imaging follow-up recommended). No aggressive appearing renal lesions are noted. No hydroureteronephrosis in the visualized portions of the abdomen. Bilateral adrenal glands are normal in appearance. Stomach/Bowel: Visualized portions are unremarkable. Vascular/Lymphatic: No aneurysm identified in the visualized abdominal vasculature. Dilated portal vein (16 mm in diameter). No lymphadenopathy noted in the abdomen. Other: No significant volume of ascites noted in the visualized portions of the peritoneal cavity. Musculoskeletal: No aggressive appearing osseous lesions are noted in the visualized portions of the skeleton. IMPRESSION: 1. Study is positive for cholelithiasis and choledocholithiasis. Several obstructing stones are noted in the distal common bile duct and there is associated proximal intra and extrahepatic  biliary ductal dilatation. There is also a moderately distended gallbladder with mild gallbladder wall thickening and edema and several stones which appear likely impacted in the region of the cystic duct. Findings are concerning for early acute cholecystitis. Surgical consultation is recommended. 2. Morphologic changes in the liver suggesting early cirrhosis. 3. 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 Electronically Signed   By: Trudie Reed M.D.   On: 03/30/2023 08:58   US ABDOMEN LIMITED RUQ (LIVER/GB)  Result Date: 03/29/2023 CLINICAL DATA:  Fever and transaminitis. EXAM: ULTRASOUND ABDOMEN LIMITED RIGHT UPPER QUADRANT COMPARISON:  September 04, 2021 FINDINGS: Gallbladder: Echogenic gallstones are seen within the gallbladder lumen. The largest gallstone measures approximately 8.7 mm. A large amount of lobulated, heterogeneous echogenic material is also seen along the nondependent wall of the gallbladder lumen no abnormal flow is seen within this region on color Doppler evaluation. The gallbladder wall measures 3.9 mm in thickness. No sonographic Murphy sign noted by sonographer. Common bile duct: Diameter: 11.2 mm Liver: Mild intrahepatic biliary dilatation is noted. No focal lesion identified. There is diffusely increased echogenicity of the liver parenchyma. Portal vein is patent on color Doppler imaging with normal direction of blood flow towards the liver. Other: Limited study secondary to inability of the patient to hold her breath. IMPRESSION: 1. Cholelithiasis  with additional findings that may represent a large amount of tumefactive sludge within the gallbladder lumen. Correlation with nonemergent MRI is recommended to further exclude the presence of an underlying neoplastic process. 2. Dilated common bile duct with intrahepatic biliary dilatation. MRCP is recommended to exclude presence of an obstructing lesion. 3. Hepatic steatosis without  focal liver lesions. Electronically Signed   By: Aram Candela M.D.   On: 03/29/2023 23:24   PROCEDURES: Critical Care performed: No Procedures MEDICATIONS ORDERED IN ED: Medications  lactated ringers bolus 1,000 mL (0 mLs Intravenous Stopped 03/30/23 0024)  LORazepam (ATIVAN) injection 0.5 mg (0.5 mg Intravenous Given 03/30/23 0025)  gadobutrol (GADAVIST) 1 MMOL/ML injection 6 mL (6 mLs Intravenous Contrast Given 03/30/23 0105)  metroNIDAZOLE (FLAGYL) IVPB 500 mg (0 mg Intravenous Stopped 03/30/23 0404)   IMPRESSION / MDM / ASSESSMENT AND PLAN / ED COURSE  I reviewed the triage vital signs and the nursing notes.                             The patient is on the cardiac monitor to evaluate for evidence of arrhythmia and/or significant heart rate changes. Patient's presentation is most consistent with acute presentation with potential threat to life or bodily function. Patient presents for altered mental status of unknown origin  Will obtain medical workup and discuss with social work to try to obtain collateral information.  Given History, Physical, and Workup there is no overt concern for a dangerous emergent cause such as, but not limited to, CNS infection, severe Toxidrome, severe metabolic derangement, or stroke. Patient has UA positive for leukocytes and few bacteria and therefore will cover empirically with ceftriaxone.  Patient does have elevated transaminases, alk phos, and bilirubin concerning for possible biliary pathology.  Right quadrant ultrasound pending at shift change Disposition: Care of this patient will be signed out to the oncoming physician at the end of my shift.  All pertinent patient information conveyed and all questions answered.  All further care and disposition decisions will be made by the oncoming physician.   FINAL CLINICAL IMPRESSION(S) / ED DIAGNOSES   Final diagnoses:  Acute UTI  Choledocholithiasis  Bacteremia   Rx / DC Orders   ED Discharge  Orders     None      Note:  This document was prepared using Dragon voice recognition software and may include unintentional dictation errors.   Merwyn Katos, MD 03/30/23 [phone]

## 2023-03-30 ENCOUNTER — Inpatient Hospital Stay: Admit: 2023-03-30 | Payer: Medicare HMO

## 2023-03-30 ENCOUNTER — Inpatient Hospital Stay (HOSPITAL_COMMUNITY)
Admission: EM | Admit: 2023-03-30 | Discharge: 2023-04-03 | DRG: 871 | Disposition: A | Discharge status: Home / Self Care | Payer: Medicare HMO | Source: Other Acute Inpatient Hospital | Attending: Obstetrics and Gynecology | Admitting: Obstetrics and Gynecology

## 2023-03-30 ENCOUNTER — Encounter (HOSPITAL_COMMUNITY): Payer: Self-pay

## 2023-03-30 ENCOUNTER — Emergency Department: Payer: Medicare HMO

## 2023-03-30 ENCOUNTER — Encounter: Payer: Self-pay | Admitting: Radiology

## 2023-03-30 DIAGNOSIS — I4891 Unspecified atrial fibrillation: Secondary | ICD-10-CM | POA: Diagnosis present

## 2023-03-30 DIAGNOSIS — K8033 Calculus of bile duct with acute cholangitis with obstruction: Secondary | ICD-10-CM | POA: Diagnosis not present

## 2023-03-30 DIAGNOSIS — E785 Hyperlipidemia, unspecified: Secondary | ICD-10-CM | POA: Diagnosis present

## 2023-03-30 DIAGNOSIS — K449 Diaphragmatic hernia without obstruction or gangrene: Secondary | ICD-10-CM | POA: Diagnosis present

## 2023-03-30 DIAGNOSIS — K8070 Calculus of gallbladder and bile duct without cholecystitis without obstruction: Secondary | ICD-10-CM | POA: Diagnosis present

## 2023-03-30 DIAGNOSIS — N182 Chronic kidney disease, stage 2 (mild): Secondary | ICD-10-CM | POA: Diagnosis present

## 2023-03-30 DIAGNOSIS — G9341 Metabolic encephalopathy: Secondary | ICD-10-CM | POA: Diagnosis present

## 2023-03-30 DIAGNOSIS — K7460 Unspecified cirrhosis of liver: Secondary | ICD-10-CM | POA: Diagnosis present

## 2023-03-30 DIAGNOSIS — N390 Urinary tract infection, site not specified: Secondary | ICD-10-CM | POA: Diagnosis present

## 2023-03-30 DIAGNOSIS — E876 Hypokalemia: Secondary | ICD-10-CM | POA: Diagnosis present

## 2023-03-30 DIAGNOSIS — K8021 Calculus of gallbladder without cholecystitis with obstruction: Secondary | ICD-10-CM | POA: Diagnosis present

## 2023-03-30 DIAGNOSIS — Z96641 Presence of right artificial hip joint: Secondary | ICD-10-CM | POA: Diagnosis present

## 2023-03-30 DIAGNOSIS — R6520 Severe sepsis without septic shock: Secondary | ICD-10-CM | POA: Diagnosis present

## 2023-03-30 DIAGNOSIS — E440 Moderate protein-calorie malnutrition: Secondary | ICD-10-CM | POA: Diagnosis present

## 2023-03-30 DIAGNOSIS — A4151 Sepsis due to Escherichia coli [E. coli]: Principal | ICD-10-CM | POA: Diagnosis present

## 2023-03-30 DIAGNOSIS — Z833 Family history of diabetes mellitus: Secondary | ICD-10-CM

## 2023-03-30 DIAGNOSIS — K766 Portal hypertension: Secondary | ICD-10-CM | POA: Diagnosis present

## 2023-03-30 DIAGNOSIS — Z993 Dependence on wheelchair: Secondary | ICD-10-CM

## 2023-03-30 DIAGNOSIS — E039 Hypothyroidism, unspecified: Secondary | ICD-10-CM | POA: Diagnosis present

## 2023-03-30 DIAGNOSIS — Z79899 Other long term (current) drug therapy: Secondary | ICD-10-CM

## 2023-03-30 DIAGNOSIS — K831 Obstruction of bile duct: Secondary | ICD-10-CM | POA: Diagnosis not present

## 2023-03-30 DIAGNOSIS — R64 Cachexia: Secondary | ICD-10-CM | POA: Diagnosis present

## 2023-03-30 DIAGNOSIS — K8051 Calculus of bile duct without cholangitis or cholecystitis with obstruction: Secondary | ICD-10-CM | POA: Diagnosis not present

## 2023-03-30 DIAGNOSIS — Z79890 Hormone replacement therapy (postmenopausal): Secondary | ICD-10-CM

## 2023-03-30 DIAGNOSIS — D638 Anemia in other chronic diseases classified elsewhere: Secondary | ICD-10-CM | POA: Diagnosis present

## 2023-03-30 DIAGNOSIS — K8050 Calculus of bile duct without cholangitis or cholecystitis without obstruction: Secondary | ICD-10-CM | POA: Diagnosis present

## 2023-03-30 DIAGNOSIS — E872 Acidosis, unspecified: Secondary | ICD-10-CM | POA: Diagnosis present

## 2023-03-30 DIAGNOSIS — R7989 Other specified abnormal findings of blood chemistry: Secondary | ICD-10-CM | POA: Diagnosis not present

## 2023-03-30 DIAGNOSIS — Z8249 Family history of ischemic heart disease and other diseases of the circulatory system: Secondary | ICD-10-CM | POA: Diagnosis not present

## 2023-03-30 DIAGNOSIS — E8809 Other disorders of plasma-protein metabolism, not elsewhere classified: Secondary | ICD-10-CM | POA: Diagnosis present

## 2023-03-30 DIAGNOSIS — Z6820 Body mass index (BMI) 20.0-20.9, adult: Secondary | ICD-10-CM

## 2023-03-30 DIAGNOSIS — Z820 Family history of epilepsy and other diseases of the nervous system: Secondary | ICD-10-CM

## 2023-03-30 DIAGNOSIS — K8030 Calculus of bile duct with cholangitis, unspecified, without obstruction: Secondary | ICD-10-CM | POA: Diagnosis not present

## 2023-03-30 DIAGNOSIS — Z1152 Encounter for screening for COVID-19: Secondary | ICD-10-CM | POA: Diagnosis not present

## 2023-03-30 DIAGNOSIS — I10 Essential (primary) hypertension: Secondary | ICD-10-CM | POA: Diagnosis present

## 2023-03-30 DIAGNOSIS — F0390 Unspecified dementia without behavioral disturbance: Secondary | ICD-10-CM | POA: Diagnosis present

## 2023-03-30 DIAGNOSIS — R933 Abnormal findings on diagnostic imaging of other parts of digestive tract: Secondary | ICD-10-CM

## 2023-03-30 DIAGNOSIS — R7881 Bacteremia: Secondary | ICD-10-CM | POA: Insufficient documentation

## 2023-03-30 DIAGNOSIS — K8309 Other cholangitis: Secondary | ICD-10-CM | POA: Diagnosis not present

## 2023-03-30 DIAGNOSIS — R4182 Altered mental status, unspecified: Secondary | ICD-10-CM | POA: Diagnosis present

## 2023-03-30 LAB — CBC
HCT: 27.5 % — ABNORMAL LOW (ref 36.0–46.0)
Hemoglobin: 8.8 g/dL — ABNORMAL LOW (ref 12.0–15.0)
MCH: 27.8 pg (ref 26.0–34.0)
MCHC: 32 g/dL (ref 30.0–36.0)
MCV: 86.8 fL (ref 80.0–100.0)
Platelets: 175 10*3/uL (ref 150–400)
RBC: 3.17 MIL/uL — ABNORMAL LOW (ref 3.87–5.11)
RDW: 14 % (ref 11.5–15.5)
WBC: 4.8 10*3/uL (ref 4.0–10.5)
nRBC: 0 % (ref 0.0–0.2)

## 2023-03-30 LAB — BLOOD CULTURE ID PANEL (REFLEXED) - BCID2

## 2023-03-30 LAB — COMPREHENSIVE METABOLIC PANEL
ALT: 109 U/L — ABNORMAL HIGH (ref 0–44)
AST: 138 U/L — ABNORMAL HIGH (ref 15–41)
Albumin: 2.7 g/dL — ABNORMAL LOW (ref 3.5–5.0)
Alkaline Phosphatase: 747 U/L — ABNORMAL HIGH (ref 38–126)
Anion gap: 10 (ref 5–15)
BUN: 13 mg/dL (ref 8–23)
CO2: 30 mmol/L (ref 22–32)
Calcium: 8.8 mg/dL — ABNORMAL LOW (ref 8.9–10.3)
Chloride: 100 mmol/L (ref 98–111)
Creatinine, Ser: 0.92 mg/dL (ref 0.44–1.00)
GFR, Estimated: >60 mL/min (ref >60)
Glucose, Bld: 83 mg/dL (ref 70–99)
Potassium: 3.4 mmol/L — ABNORMAL LOW (ref 3.5–5.1)
Sodium: 140 mmol/L (ref 135–145)
Total Bilirubin: 1.6 mg/dL — ABNORMAL HIGH (ref 0.3–1.2)
Total Protein: 5.1 g/dL — ABNORMAL LOW (ref 6.5–8.1)

## 2023-03-30 LAB — TYPE AND SCREEN
ABO/RH(D): AB POS
Antibody Screen: NEGATIVE

## 2023-03-30 LAB — LACTIC ACID, PLASMA: Lactic Acid, Venous: 1.2 mmol/L (ref 0.5–1.9)

## 2023-03-30 MED ORDER — POLYETHYLENE GLYCOL 3350 17 G PO PACK: 17.0000 g | PACK | Freq: Every day | ORAL | Status: DC | PRN

## 2023-03-30 MED ORDER — ONDANSETRON HCL 4 MG/2ML IJ SOLN: 4.0000 mg | Freq: Four times a day (QID) | INTRAMUSCULAR | Status: DC | PRN

## 2023-03-30 MED ORDER — BOOST / RESOURCE BREEZE PO LIQD CUSTOM
1.0000 | Freq: Three times a day (TID) | ORAL | Status: DC
  Administered 2023-03-30 – 2023-04-02 (×7): 1 via ORAL

## 2023-03-30 MED ORDER — SODIUM CHLORIDE 0.9% FLUSH
3.0000 mL | Freq: Two times a day (BID) | INTRAVENOUS | Status: DC
  Administered 2023-03-30 – 2023-04-02 (×5): 3 mL via INTRAVENOUS

## 2023-03-30 MED ORDER — LEVOTHYROXINE SODIUM 75 MCG PO TABS
75.0000 ug | ORAL_TABLET | Freq: Every day | ORAL | Status: DC
  Administered 2023-03-31 – 2023-04-02 (×2): 75 ug via ORAL
  Filled 2023-03-30 (×3): qty 1

## 2023-03-30 MED ORDER — ATORVASTATIN CALCIUM 10 MG PO TABS
10.0000 mg | ORAL_TABLET | Freq: Every day | ORAL | Status: DC
  Administered 2023-03-31 – 2023-04-02 (×3): 10 mg via ORAL
  Filled 2023-03-30 (×4): qty 1

## 2023-03-30 MED ORDER — ACETAMINOPHEN 325 MG PO TABS: 650.0000 mg | ORAL_TABLET | Freq: Four times a day (QID) | ORAL | Status: DC | PRN

## 2023-03-30 MED ORDER — LORAZEPAM 2 MG/ML IJ SOLN
0.5000 mg | Freq: Once | INTRAMUSCULAR | Status: AC
  Administered 2023-03-30: 0.5 mg via INTRAVENOUS
  Filled 2023-03-30: qty 1

## 2023-03-30 MED ORDER — GADOBUTROL 1 MMOL/ML IV SOLN
6.0000 mL | Freq: Once | INTRAVENOUS | Status: AC | PRN
  Administered 2023-03-30: 6 mL via INTRAVENOUS

## 2023-03-30 MED ORDER — SODIUM CHLORIDE 0.9 % IV SOLN: INTRAVENOUS | Status: AC

## 2023-03-30 MED ORDER — METRONIDAZOLE 500 MG/100ML IV SOLN
500.0000 mg | Freq: Two times a day (BID) | INTRAVENOUS | Status: DC
  Filled 2023-03-30: qty 100

## 2023-03-30 MED ORDER — PANTOPRAZOLE SODIUM 40 MG PO TBEC
40.0000 mg | DELAYED_RELEASE_TABLET | Freq: Every day | ORAL | Status: DC
  Administered 2023-03-31 – 2023-04-02 (×3): 40 mg via ORAL
  Filled 2023-03-30 (×4): qty 1

## 2023-03-30 MED ORDER — ACETAMINOPHEN 650 MG RE SUPP: 650.0000 mg | Freq: Four times a day (QID) | RECTAL | Status: DC | PRN

## 2023-03-30 MED ORDER — SODIUM CHLORIDE 0.9 % IV SOLN
2.0000 g | INTRAVENOUS | Status: DC
  Administered 2023-03-30 – 2023-03-31 (×2): 2 g via INTRAVENOUS
  Filled 2023-03-30 (×2): qty 20

## 2023-03-30 MED ORDER — METRONIDAZOLE 500 MG/100ML IV SOLN
500.0000 mg | Freq: Once | INTRAVENOUS | Status: AC
  Administered 2023-03-30: 500 mg via INTRAVENOUS
  Filled 2023-03-30: qty 100

## 2023-03-30 NOTE — ED Notes (Signed)
Called [facility] transfer center spoke with rep. Marcelino Duster she stated [facility] is at capacity and not accepting med transfers.

## 2023-03-30 NOTE — ED Provider Notes (Addendum)
12:00 AM  Assumed care at shift change.  Ultrasound reviewed and interpreted by myself and radiologist and shows cholelithiasis, sludge, common bile duct dilatation.  Given elevated liver function test including total bilirubin, will obtain MRCP to evaluate for choledocholithiasis.  2:16 AM  D/w Dr. Kearney Hard with radiology.  His preliminary read is that patient does have choledocholithiasis with 2 stones noted in the common bile duct.  No cholecystitis.  Will discuss with GI on-call to see if we have the capabilities for ERCP for this patient.  If not, patient will need to be transferred out.  Family is aware and they would like for [place] to reach out to [facility] first.  3:03 AM  [facility] at capacity and unable to accept medical transfers at this time.  Spoke with [facility] transfer center.  They are also at capacity but will discuss the case with their administrator on-call at 6:30 AM to see if they would be able to accept the patient.  Will also reach out to [facility].  3:50 AM  Spoke with Dr. Janalyn Shy with hospitalist service who agrees to accept patient in transfer as long as GI is on board.  [facility] spoke with gastroenterologist on-call at [facility] who agreed to see patient in consultation this weekend.  Will transfer to [facility] once bed is available.  Will update patient's daughter.  Will keep NPO, give antiemetic as needed, continue antibiotics and IV fluids.  Repeat lactic is normal.  Patient remains hemodynamically stable.  Drowsy after Ativan but arousable.   CRITICAL CARE Performed by: Rochele Raring   Total critical care time: 30 minutes  Critical care time was exclusive of separately billable procedures and treating other patients.  Critical care was necessary to treat or prevent imminent or life-threatening deterioration.  Critical care was time spent personally by me on the following activities: development of treatment plan with patient and/or surrogate as well as nursing,  discussions with consultants, evaluation of patient's response to treatment, examination of patient, obtaining history from patient or surrogate, ordering and performing treatments and interventions, ordering and review of laboratory studies, ordering and review of radiographic studies, pulse oximetry and re-evaluation of patient's condition.    Angel Cameron, Angel Maw, DO 03/30/23 [phone]

## 2023-03-30 NOTE — ED Notes (Signed)
Spoke with rep michelle from [facility] transfer center faxed demo sheet, power shared images, and transferred to Dr. Elesa Massed for further consult.

## 2023-03-30 NOTE — ED Notes (Signed)
Bed alarm turned on as daughter is going home, had been at bedside

## 2023-03-30 NOTE — H&P (View-Only) (Signed)
Consultation  Referring Provider: Ward, Layla Maw, DO Primary Care Physician:  System, Provider Not In Primary Gastroenterologist:  Unassigned  Reason for Consultation: Fever, elevated LFTs, choledocholithiasis, cholangitis  HPI: Angel Cameron is a 82 y.o. female, who was transferred here this afternoon from [facility].  She had presented there to the emergency room yesterday from her assisted living facility after her daughter had gone to visit her and found patient having shaking chills and with altered mental status. Evaluation in the emergency room and patient to be febrile 103.1.Marland Kitchen  She met sepsis criteria, noted lactate to 2.1. She was started on ceftriaxone and metronidazole.  Noted to have elevated LFTs with T. bili 2.7/alk phos 997/AST 434 and ALT 162. INR 1.1 BUN 20/creatinine 0.76 WBC 7.5/hemoglobin 10.2/hematocrit of 32.  Had CT of the abdomen and pelvis, then MRI/MRCP.  Noted on MRI to have an irregular appearing liver consistent with cirrhosis, splenomegaly, common bile duct to 10 mm in the porta hepatis proximally and there are several filling defects in the distal common bile duct measuring up to 9 mm.  Cystic duct not well-seen but small filling defects in the cystic duct suggest probable stones, also noted to have small stones in gallbladder and gallbladder moderately distended with mild wall thickening and edema. 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  Today's labs are pending Blood cultures positive for E. Coli. Urine was also abnormal with greater than 50 WBCs/large leukocytes and urine cultures pending  Patient is somnolent and not easily arousable, unable to participate.  Her daughter is at bedside and offers the history.  She says her mom is usually conversant but somewhat confused, had been ambulatory until she had a fall with hip fracture in January. She has not been anticoagulated. Patient's daughter was unaware  of any prior diagnosis of gallstones.  No known history of liver disease.  No history of EtOH use.   Past Medical History:  Diagnosis Date   Hypertension    Memory loss     Past Surgical History:  Procedure Laterality Date   COLONOSCOPY     HERNIA REPAIR     HIP ARTHROPLASTY Right 06/28/2022   Procedure: ARTHROPLASTY BIPOLAR HIP (HEMIARTHROPLASTY);  Surgeon: Juanell Fairly, MD;  Location: [facility] ORS;  Service: Orthopedics;  Laterality: Right;    Prior to Admission medications   Medication Sig Start Date End Date Taking? Authorizing Provider  atorvastatin (LIPITOR) 10 MG tablet Take 10 mg by mouth daily.    [provider]  benazepril (LOTENSIN) 40 MG tablet Take 40 mg by mouth 2 (two) times daily.    [provider]  bisacodyl (DULCOLAX) 10 MG suppository Place 1 suppository (10 mg total) rectally daily as needed for moderate constipation. 07/04/22   Kirby Crigler, Mir M, MD  docusate sodium (COLACE) 100 MG capsule Take 1 capsule (100 mg total) by mouth 2 (two) times daily. 07/04/22   Kirby Crigler, Mir M, MD  donepezil (ARICEPT) 5 MG tablet Take 5 mg by mouth at bedtime.    [provider]  enoxaparin (LOVENOX) 40 MG/0.4ML injection Inject 0.4 mLs (40 mg total) into the skin daily for 21 days. 07/05/22 07/26/22  Kirby Crigler, Mir M, MD  FLUoxetine (PROZAC) 10 MG capsule Take 10 mg by mouth daily. 05/24/22   [provider]  HYDROcodone-acetaminophen (NORCO/VICODIN) 5-325 MG tablet Take 1-2 tablets by mouth every 4 (four) hours as needed for moderate pain (pain score 4-6). 07/04/22   Maryln Gottron, MD  levothyroxine (SYNTHROID, LEVOTHROID) 75 MCG tablet Take 75 mcg by mouth daily before breakfast.    [provider]  meclizine (ANTIVERT) 25 MG tablet Take 0.5 tablets (12.5 mg total) by mouth 2 (two) times daily as needed for dizziness. 09/02/19   Sharman Cheek, MD  memantine (NAMENDA) 5 MG tablet Take 5 mg by mouth daily.    [provider]   metoprolol succinate (TOPROL-XL) 50 MG 24 hr tablet Take 50 mg by mouth daily. 05/24/22   [provider]  Multiple Vitamin (MULTIVITAMIN WITH MINERALS) TABS tablet Take 1 tablet by mouth daily.    [provider]  Nutritional Supplements (,FEEDING SUPPLEMENT, PROSOURCE PLUS) liquid Take 30 mLs by mouth 2 (two) times daily between meals. 07/04/22   Kirby Crigler, Mir M, MD  omeprazole (PRILOSEC) 20 MG capsule Take 20 mg by mouth daily.    [provider]  traZODone (DESYREL) 50 MG tablet Take 1 tablet (50 mg total) by mouth at bedtime as needed for sleep. 07/04/22   Maryln Gottron, MD    Current Facility-Administered Medications  Medication Dose Route Frequency Provider Last Rate Last Admin   0.9 %  sodium chloride infusion   Intravenous Continuous Synetta Fail, MD       acetaminophen (TYLENOL) tablet 650 mg  650 mg Oral Q6H PRN Synetta Fail, MD       Or   acetaminophen (TYLENOL) suppository 650 mg  650 mg Rectal Q6H PRN Synetta Fail, MD       [START ON 03/31/2023] atorvastatin (LIPITOR) tablet 10 mg  10 mg Oral Daily Synetta Fail, MD       cefTRIAXone (ROCEPHIN) 2 g in sodium chloride 0.9 % 100 mL IVPB  2 g Intravenous Q24H Synetta Fail, MD       [START ON 03/31/2023] levothyroxine (SYNTHROID) tablet 75 mcg  75 mcg Oral QAC breakfast Synetta Fail, MD       metroNIDAZOLE (FLAGYL) IVPB 500 mg  500 mg Intravenous Q12H Synetta Fail, MD       [START ON 03/31/2023] pantoprazole (PROTONIX) EC tablet 40 mg  40 mg Oral Daily Synetta Fail, MD       polyethylene glycol (MIRALAX / GLYCOLAX) packet 17 g  17 g Oral Daily PRN Synetta Fail, MD       sodium chloride flush (NS) 0.9 % injection 3 mL  3 mL Intravenous Q12H Synetta Fail, MD        Allergies as of 03/30/2023   (No Known Allergies)    Family History  Problem Relation Age of Onset   Diabetes Mother    Dementia Mother    Heart failure Father      Social History   Socioeconomic History   Marital status: Married    Spouse name: Not on file   Number of children: Not on file   Years of education: Not on file   Highest education level: Not on file  Occupational History   Not on file  Tobacco Use   Smoking status: Never   Smokeless tobacco: Never  Substance and Sexual Activity   Alcohol use: Not Currently   Drug use: Never   Sexual activity: Not Currently  Other Topics Concern   Not on file  Social History Narrative   Not on file   Social Determinants of Health   Financial Resource Strain: Not on file  Food Insecurity: Food Insecurity Present (07/04/2022)   Hunger Vital Sign  Worried About Programme researcher, broadcasting/film/video in the Last Year: Often true    Ran Out of Food in the Last Year: Often true  Transportation Needs: No Transportation Needs (07/04/2022)   PRAPARE - Administrator, Civil Service (Medical): No    Lack of Transportation (Non-Medical): No  Physical Activity: Not on file  Stress: Not on file  Social Connections: Not on file  Intimate Partner Violence: Not At Risk (07/04/2022)   Humiliation, Afraid, Rape, and Kick questionnaire    Fear of Current or Ex-Partner: No    Emotionally Abused: No    Physically Abused: No    Sexually Abused: No    Review of Systems: Pertinent positive and negative review of systems were noted in the above HPI section.  All other review of systems was otherwise negative.   Physical Exam: Vital signs in last 24 hours: Temp:  [98.4 F (36.9 C)-103.1 F (39.5 C)] 98.4 F (36.9 C) (10/12 1521) Pulse Rate:  [66-111] 66 (10/12 1521) Resp:  [9-24] 16 (10/12 1521) BP: (101-147)/(51-113) 132/69 (10/12 1521) SpO2:  [94 %-100 %] 94 % (10/12 1521) Weight:  [52 kg-56.7 kg] 52 kg (10/12 1524) Last BM Date : 03/28/23 General:   Alert,  Well-developed, thin frail appearing elderly African-American female, obtunded, arouses easily to exam not conversant Head:  Normocephalic and  atraumatic.  Normal neck ROM Eyes:  Sclera icteric   conjunctiva pink.  she has an ecchymosis around the right orbit (recent fall) Ears:  Normal auditory acuity. Nose:  No deformity, discharge,  or lesions. Mouth:  No deformity or lesions.  Oral opening and dental condition sufficient for scope passage Neck:  Supple; no masses or thyromegaly. Lungs:  Clear throughout to auscultation.   No wheezes, crackles, or rhonchi.  Heart:  Regular rate and rhythm; no murmurs, clicks, rubs,  or gallops. Abdomen:  Soft, nondistended, bowel sounds are present, she is tender to palpation of the epigastrium no rebound.   Rectal: Not done Msk:  Symmetrical without gross deformities. . Pulses:  Normal pulses noted. Extremities:  Without clubbing or edema. Neurologic: Somnolent, unable to arouse, grimaces to exam of the abdomen, some soft mumbling Skin:  Intact without significant lesions or rashes..  Intake/Output from previous day: No intake/output data recorded. Intake/Output this shift: No intake/output data recorded.  Lab Results: Recent Labs    03/29/23 1746  WBC 7.5  HGB 10.2*  HCT 32.0*  PLT 201   BMET Recent Labs    03/29/23 1746  NA 136  K 4.2  CL 100  CO2 27  GLUCOSE 212*  BUN 24*  CREATININE 0.76  CALCIUM 8.7*   LFT Recent Labs    03/29/23 1746  PROT 6.4*  ALBUMIN 3.3*  AST 434*  ALT 162*  ALKPHOS 997*  BILITOT 2.7*   PT/INR Recent Labs    03/29/23 1746  LABPROT 14.7  INR 1.1   Hepatitis Panel No results for input(s): "HEPBSAG", "HCVAB", "HEPAIGM", "HEPBIGM" in the last 72 hours.  IMPRESSION:  #18 82 year old African-American female with history of dementia, resident of an assisted living facility who presented to the emergency room yesterday at [facility] after being found to have a shaking chill/rigor altered mental status. Febrile in the ER to 103.1 Met sepsis criteria, no hypotension. Workup revealing for elevated LFTs, and CT abdomen and pelvis  and then MRI/MRCP revealing evidence of cirrhosis and splenomegaly, as well as a dilated common bile duct to 10 mm proximally, and evidence of several  filling defects in the distal common bile duct up to 9 mm, likely stones in the cystic duct, as well as in the gallbladder.  There is some mild gallbladder wall thickening/edema, cannot rule out cholecystitis.  Cultures returning positive for E. Coli  #2 Patient also found to have UTI. #3 anemia mild normocytic #4 Status post hip fracture #5 history of hypertension    PLAN: Clear liquids tonight if alert enough to take,, n.p.o. after midnight Patient has been scheduled for ERCP with stone extraction with Dr. Russella Dar for tomorrow 03/31/2023.  Procedure was discussed in detail with her daughter at bedside including indications risks and benefits and she is agreeable to proceed. Daughter signed consent at bedside.  Continue Rocephin, discontinue metronidazole Hold Lovenox until after ERCP Repeat labs in a.m. Because of concerns for cholelithiasis and possible mild cholecystitis she will also need surgical consultation, that can wait until after ERCP. GI will follow with you   Amy EsterwoodPA-C  03/30/2023, 4:24 PM  I have taken an interval history, thoroughly reviewed the chart and examined the patient. I agree with the Advanced Practitioner's note, impression and recommendations, and have recorded additional findings, impressions and recommendations below. I performed a substantive portion of this encounter (>50% time spent), including a complete performance of the medical decision making.  My additional thoughts are as follows:  Elderly woman with cholangitis and choledocholithiasis.  Clinically stable on antibiotics, marked LFT elevation and definite stones in the CBD on imaging.  Bacteremic with E. coli.  Somnolent today, but altered mental status from reported baseline according to the daughter.  This is due to her acute illness with  underlying dementia.  Additional diagnosis would be abnormal imaging of the GI tract suggesting morphologic changes suggestive of cirrhosis with splenomegaly.  There is dilatation of the portal vein, though no reported intra-abdominal or perigastric/paraesophageal varices noted.  Platelets, coags and last prehospitalization LFTs in January of this year were normal.   The plan is for ERCP tomorrow with our biliary endoscopist Dr. Claudette Head.  Procedure described in detail along with description of the anatomy and technical aspects along with risks and benefits and her daughter Angel Cameron was agreeable.  The benefits and risks of the planned procedure were described in detail with the patient or (when appropriate) their health care proxy.  Risks were outlined as including, but not limited to, bleeding, infection, perforation, adverse medication reaction leading to cardiac or pulmonary decompensation, pancreatitis (if ERCP).  The limitation of incomplete mucosal visualization was also discussed.  No guarantees or warranties were given.  Patient at increased risk for cardiopulmonary complications of procedure due to medical comorbidities. Consent was obtained and signed by her daughter and me in the presence of her nurse as a witness.  I will be sure Dr. Russella Dar is also aware of the imaging findings suggesting cirrhosis, and he will most likely start the procedure with a standard upper endoscope to assess for any esophageal or gastric varices prior to passing the duodenoscope.  Charlie Pitter III Office:[phone]

## 2023-03-30 NOTE — ED Notes (Signed)
EMTALA Reviewed by this RN.  

## 2023-03-30 NOTE — ED Notes (Signed)
Report was called to Rolla Flatten, Charity fundraiser at [facility]. Pt was just cleaned up from a wet brief and the daughter, that's at bedside, was made aware of transport and room assignment.

## 2023-03-30 NOTE — H&P (Addendum)
History and Physical   Angel Cameron HYQ:[id number] DOB: Feb 23, 1941 DOA: 03/30/2023  PCP: System, Provider Not In   Patient coming from: [facility] (facility) via [facility] ED  Chief Complaint: Altered mental status  HPI: Angel Cameron is a 82 y.o. female with medical history significant of hypertension, hyperlipidemia, hypothyroidism, dementia, CKD 2 presenting with altered mental status from facility.  History obtained with assistance of chart review and family.  Patient reportedly was near baseline per staff in the morning of 10/11 but later developed worsening confusion.  Has been found to be febrile as well.  Patient has baseline dementia but has been more confused than baseline per family.  Unable to fully participate in review of systems due to altered mentation baseline dementia.  ED Course: Vital signs in the ED notable for fever to 103.1, blood pressure in the 100s to 140 systolic, heart rate in the 60s to 100s.  Lab workup included CMP with BUN 24, glucose 212, calcium 8.7, protein 6.4, albumin 3.3, AST 434, ALT 162, alk phos 997, T. bili 2.7.  Lactic acid initially elevated at 2.1 with repeat improved to normal.  CBC showed stable hemoglobin at 10.2 and no leukocytosis.  PT and INR normal.  Respiratory panel for flu COVID RSV negative.  Urinalysis with leukocytes and bacteria only.  Urine culture pending.  Blood culture came back with E. coli without initial notable resistance in 3 out of 4 bottles.  Chest x-ray showed no acute normality.  CT head showed no acute normality.  Right upper quadrant ultrasound showed cholelithiasis with gallbladder sludge and dilated common bile duct with recommendation for MRCP.  MRCP showed choledocholithiasis, early cholecystitis, several stones at distal common bile duct, moderate distention of gallbladder with mild gallbladder wall thickening, changes consistent with early cirrhosis, evidence of portal hypertension.  GI consulted they are  recommending transfer to facility able to accommodate ERCP sooner than later.  [facility] and [facility] were at capacity and patient was accepted for transfer to [facility] after GI here has agreed to see patient on arrival.  Review of Systems: Unable to fully participate in review of systems due to altered mentation baseline dementia.  Past Medical History:  Diagnosis Date   Hypertension    Memory loss     Past Surgical History:  Procedure Laterality Date   COLONOSCOPY     HERNIA REPAIR     HIP ARTHROPLASTY Right 06/28/2022   Procedure: ARTHROPLASTY BIPOLAR HIP (HEMIARTHROPLASTY);  Surgeon: Juanell Fairly, MD;  Location: [facility] ORS;  Service: Orthopedics;  Laterality: Right;    Social History  reports that she has never smoked. She has never used smokeless tobacco. She reports that she does not currently use alcohol. She reports that she does not use drugs.  No Known Allergies  Family History  Problem Relation Age of Onset   Diabetes Mother    Dementia Mother    Heart failure Father   Reviewed on admission  Prior to Admission medications   Medication Sig Start Date End Date Taking? Authorizing Provider  atorvastatin (LIPITOR) 10 MG tablet Take 10 mg by mouth daily.    [provider]  benazepril (LOTENSIN) 40 MG tablet Take 40 mg by mouth 2 (two) times daily.    [provider]  bisacodyl (DULCOLAX) 10 MG suppository Place 1 suppository (10 mg total) rectally daily as needed for moderate constipation. 07/04/22   Kirby Crigler, Mir M, MD  docusate sodium (COLACE) 100 MG capsule Take 1 capsule (100 mg total)  by mouth 2 (two) times daily. 07/04/22   Kirby Crigler, Mir M, MD  donepezil (ARICEPT) 5 MG tablet Take 5 mg by mouth at bedtime.    [provider]  enoxaparin (LOVENOX) 40 MG/0.4ML injection Inject 0.4 mLs (40 mg total) into the skin daily for 21 days. 07/05/22 07/26/22  Kirby Crigler, Mir M, MD  FLUoxetine (PROZAC) 10 MG capsule Take 10 mg by mouth daily. 05/24/22    [provider]  HYDROcodone-acetaminophen (NORCO/VICODIN) 5-325 MG tablet Take 1-2 tablets by mouth every 4 (four) hours as needed for moderate pain (pain score 4-6). 07/04/22   Kirby Crigler, Mir M, MD  levothyroxine (SYNTHROID, LEVOTHROID) 75 MCG tablet Take 75 mcg by mouth daily before breakfast.    [provider]  meclizine (ANTIVERT) 25 MG tablet Take 0.5 tablets (12.5 mg total) by mouth 2 (two) times daily as needed for dizziness. 09/02/19   Sharman Cheek, MD  memantine (NAMENDA) 5 MG tablet Take 5 mg by mouth daily.    [provider]  metoprolol succinate (TOPROL-XL) 50 MG 24 hr tablet Take 50 mg by mouth daily. 05/24/22   [provider]  Multiple Vitamin (MULTIVITAMIN WITH MINERALS) TABS tablet Take 1 tablet by mouth daily.    [provider]  Nutritional Supplements (,FEEDING SUPPLEMENT, PROSOURCE PLUS) liquid Take 30 mLs by mouth 2 (two) times daily between meals. 07/04/22   Kirby Crigler, Mir M, MD  omeprazole (PRILOSEC) 20 MG capsule Take 20 mg by mouth daily.    [provider]  traZODone (DESYREL) 50 MG tablet Take 1 tablet (50 mg total) by mouth at bedtime as needed for sleep. 07/04/22   Maryln Gottron, MD    Physical Exam: Vitals:   03/30/23 1524  Weight: 52 kg    Physical Exam Constitutional:      General: She is not in acute distress.    Appearance: Normal appearance.     Comments: Tired appearing elderly female  HENT:     Head: Normocephalic and atraumatic.     Mouth/Throat:     Mouth: Mucous membranes are moist.     Pharynx: Oropharynx is clear.  Eyes:     Extraocular Movements: Extraocular movements intact.     Pupils: Pupils are equal, round, and reactive to light.  Cardiovascular:     Rate and Rhythm: Normal rate and regular rhythm.     Pulses: Normal pulses.     Heart sounds: Normal heart sounds.  Pulmonary:     Effort: Pulmonary effort is normal. No respiratory distress.     Breath sounds: Normal  breath sounds.  Abdominal:     General: Bowel sounds are normal. There is no distension.     Palpations: Abdomen is soft.     Tenderness: There is abdominal tenderness.  Musculoskeletal:        General: No swelling or deformity.  Skin:    General: Skin is warm and dry.  Neurological:     General: No focal deficit present.     Mental Status: Mental status is at baseline.    Labs on Admission: I have personally reviewed following labs and imaging studies  CBC: Recent Labs  Lab 03/29/23 1746  WBC 7.5  NEUTROABS 6.5  HGB 10.2*  HCT 32.0*  MCV 89.1  PLT 201    Basic Metabolic Panel: Recent Labs  Lab 03/29/23 1746  NA 136  K 4.2  CL 100  CO2 27  GLUCOSE 212*  BUN 24*  CREATININE 0.76  CALCIUM 8.7*  GFR: Estimated Creatinine Clearance: 43 mL/min (by C-G formula based on SCr of 0.76 mg/dL).  Liver Function Tests: Recent Labs  Lab 03/29/23 1746  AST 434*  ALT 162*  ALKPHOS 997*  BILITOT 2.7*  PROT 6.4*  ALBUMIN 3.3*    Urine analysis:    Component Value Date/Time   COLORURINE AMBER (A) 03/29/2023 1845   APPEARANCEUR HAZY (A) 03/29/2023 1845   LABSPEC 1.017 03/29/2023 1845   PHURINE 6.0 03/29/2023 1845   GLUCOSEU NEGATIVE 03/29/2023 1845   HGBUR NEGATIVE 03/29/2023 1845   BILIRUBINUR NEGATIVE 03/29/2023 1845   KETONESUR NEGATIVE 03/29/2023 1845   PROTEINUR NEGATIVE 03/29/2023 1845   NITRITE NEGATIVE 03/29/2023 1845   LEUKOCYTESUR LARGE (A) 03/29/2023 1845    Radiological Exams on Admission: MR ABDOMEN MRCP W WO CONTAST  Result Date: 03/30/2023 CLINICAL DATA:  82 year old female history of cholelithiasis and possible tumefactive sludge in the gallbladder lumen. EXAM: MRI ABDOMEN WITHOUT AND WITH CONTRAST (INCLUDING MRCP) TECHNIQUE: Multiplanar multisequence MR imaging of the abdomen was performed both before and after the administration of intravenous contrast. Heavily T2-weighted images of the biliary and pancreatic ducts were obtained, and  three-dimensional MRCP images were rendered by post processing. CONTRAST:  6mL GADAVIST GADOBUTROL 1 MMOL/ML IV SOLN COMPARISON:  No prior abdominal MRI. Abdominal ultrasound 03/29/2023. FINDINGS: Lower chest: Unremarkable. Hepatobiliary: Liver contour is mildly irregular, suggesting a background of cirrhosis. No suspicious cystic or solid hepatic lesions. Mild intrahepatic biliary ductal dilatation. Common bile duct is also dilated measuring up to 10 mm in the porta hepatis proximally. Several filling defects are noted in the distal common bile duct, measuring up to 9 mm. Cystic duct is not well visualized, but small filling defects in the cystic duct suggest probable stones in this cystic duct. Small filling defects are also noted lying dependently in the gallbladder, indicative of cholelithiasis. Gallbladder is moderately distended. Gallbladder wall appears mildly thickened and edematous. Pancreas: No pancreatic mass. No pancreatic ductal dilatation. No pancreatic or peripancreatic fluid collections or inflammatory changes. Spleen: Spleen is enlarged measuring 12.9 x 9.4 x 16.7 cm (estimated splenic volume of 1,013 mL). Adrenals/Urinary Tract: Several subcentimeter T1 hypointense, T2 hyperintense, nonenhancing lesions in both kidneys are compatible with tiny simple cysts (Bosniak class 1, no imaging follow-up recommended). No aggressive appearing renal lesions are noted. No hydroureteronephrosis in the visualized portions of the abdomen. Bilateral adrenal glands are normal in appearance. Stomach/Bowel: Visualized portions are unremarkable. Vascular/Lymphatic: No aneurysm identified in the visualized abdominal vasculature. Dilated portal vein (16 mm in diameter). No lymphadenopathy noted in the abdomen. Other: No significant volume of ascites noted in the visualized portions of the peritoneal cavity. Musculoskeletal: No aggressive appearing osseous lesions are noted in the visualized portions of the skeleton.  IMPRESSION: 1. Study is positive for cholelithiasis and choledocholithiasis. Several obstructing stones are noted in the distal common bile duct and there is associated proximal intra and extrahepatic biliary ductal dilatation. There is also a moderately distended gallbladder with mild gallbladder wall thickening and edema and several stones which appear likely impacted in the region of the cystic duct. Findings are concerning for early acute cholecystitis. Surgical consultation is recommended. 2. Morphologic changes in the liver suggesting early cirrhosis. 3. 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 Electronically Signed   By: Trudie Reed M.D.   On: 03/30/2023 08:58   MR 3D Recon At Scanner  Result Date: 03/30/2023 CLINICAL DATA:  82 year old female history of cholelithiasis and possible tumefactive sludge in the  gallbladder lumen. EXAM: MRI ABDOMEN WITHOUT AND WITH CONTRAST (INCLUDING MRCP) TECHNIQUE: Multiplanar multisequence MR imaging of the abdomen was performed both before and after the administration of intravenous contrast. Heavily T2-weighted images of the biliary and pancreatic ducts were obtained, and three-dimensional MRCP images were rendered by post processing. CONTRAST:  6mL GADAVIST GADOBUTROL 1 MMOL/ML IV SOLN COMPARISON:  No prior abdominal MRI. Abdominal ultrasound 03/29/2023. FINDINGS: Lower chest: Unremarkable. Hepatobiliary: Liver contour is mildly irregular, suggesting a background of cirrhosis. No suspicious cystic or solid hepatic lesions. Mild intrahepatic biliary ductal dilatation. Common bile duct is also dilated measuring up to 10 mm in the porta hepatis proximally. Several filling defects are noted in the distal common bile duct, measuring up to 9 mm. Cystic duct is not well visualized, but small filling defects in the cystic duct suggest probable stones in this cystic duct. Small filling defects are also noted lying  dependently in the gallbladder, indicative of cholelithiasis. Gallbladder is moderately distended. Gallbladder wall appears mildly thickened and edematous. Pancreas: No pancreatic mass. No pancreatic ductal dilatation. No pancreatic or peripancreatic fluid collections or inflammatory changes. Spleen: Spleen is enlarged measuring 12.9 x 9.4 x 16.7 cm (estimated splenic volume of 1,013 mL). Adrenals/Urinary Tract: Several subcentimeter T1 hypointense, T2 hyperintense, nonenhancing lesions in both kidneys are compatible with tiny simple cysts (Bosniak class 1, no imaging follow-up recommended). No aggressive appearing renal lesions are noted. No hydroureteronephrosis in the visualized portions of the abdomen. Bilateral adrenal glands are normal in appearance. Stomach/Bowel: Visualized portions are unremarkable. Vascular/Lymphatic: No aneurysm identified in the visualized abdominal vasculature. Dilated portal vein (16 mm in diameter). No lymphadenopathy noted in the abdomen. Other: No significant volume of ascites noted in the visualized portions of the peritoneal cavity. Musculoskeletal: No aggressive appearing osseous lesions are noted in the visualized portions of the skeleton. IMPRESSION: 1. Study is positive for cholelithiasis and choledocholithiasis. Several obstructing stones are noted in the distal common bile duct and there is associated proximal intra and extrahepatic biliary ductal dilatation. There is also a moderately distended gallbladder with mild gallbladder wall thickening and edema and several stones which appear likely impacted in the region of the cystic duct. Findings are concerning for early acute cholecystitis. Surgical consultation is recommended. 2. Morphologic changes in the liver suggesting early cirrhosis. 3. 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 Electronically Signed   By: Trudie Reed M.D.   On: 03/30/2023 08:58   US ABDOMEN  LIMITED RUQ (LIVER/GB)  Result Date: 03/29/2023 CLINICAL DATA:  Fever and transaminitis. EXAM: ULTRASOUND ABDOMEN LIMITED RIGHT UPPER QUADRANT COMPARISON:  September 04, 2021 FINDINGS: Gallbladder: Echogenic gallstones are seen within the gallbladder lumen. The largest gallstone measures approximately 8.7 mm. A large amount of lobulated, heterogeneous echogenic material is also seen along the nondependent wall of the gallbladder lumen no abnormal flow is seen within this region on color Doppler evaluation. The gallbladder wall measures 3.9 mm in thickness. No sonographic Murphy sign noted by sonographer. Common bile duct: Diameter: 11.2 mm Liver: Mild intrahepatic biliary dilatation is noted. No focal lesion identified. There is diffusely increased echogenicity of the liver parenchyma. Portal vein is patent on color Doppler imaging with normal direction of blood flow towards the liver. Other: Limited study secondary to inability of the patient to hold her breath. IMPRESSION: 1. Cholelithiasis with additional findings that may represent a large amount of tumefactive sludge within the gallbladder lumen. Correlation with nonemergent MRI is recommended to further exclude the presence of  an underlying neoplastic process. 2. Dilated common bile duct with intrahepatic biliary dilatation. MRCP is recommended to exclude presence of an obstructing lesion. 3. Hepatic steatosis without focal liver lesions. Electronically Signed   By: Aram Candela M.D.   On: 03/29/2023 23:24   CT Head Wo Contrast  Result Date: 03/29/2023 CLINICAL DATA:  Mental status change, unknown cause EXAM: CT HEAD WITHOUT CONTRAST TECHNIQUE: Contiguous axial images were obtained from the base of the skull through the vertex without intravenous contrast. RADIATION DOSE REDUCTION: This exam was performed according to the departmental dose-optimization program which includes automated exposure control, adjustment of the mA and/or kV according to  patient size and/or use of iterative reconstruction technique. COMPARISON:  03/20/2023 FINDINGS: Brain: There is atrophy and chronic small vessel disease changes. No acute intracranial abnormality. Specifically, no hemorrhage, hydrocephalus, mass lesion, acute infarction, or significant intracranial injury. Vascular: No hyperdense vessel or unexpected calcification. Skull: No acute calvarial abnormality. Sinuses/Orbits: No acute findings Other: None IMPRESSION: Atrophy, chronic microvascular disease. No acute intracranial abnormality. Electronically Signed   By: Charlett Nose M.D.   On: 03/29/2023 20:15   DG Chest Port 1 View  Result Date: 03/29/2023 CLINICAL DATA:  Sepsis EXAM: PORTABLE CHEST 1 VIEW COMPARISON:  03/20/2023 FINDINGS: Heart and mediastinal contours within normal limits. Rounded density in the right hilum likely vascular. Lungs clear. No effusions or acute bony abnormality. IMPRESSION: No active disease. Rounded density in the right hilum felt to reflect right hilar vasculature. This could be confirmed with chest CT with IV contrast. Electronically Signed   By: Charlett Nose M.D.   On: 03/29/2023 20:14    EKG: Independently reviewed.  Sinus rhythm at 99 bpm.  Significant baseline artifact and minimal baseline wander.  Nonspecific T wave changes.  Assessment/Plan Principal Problem:   Biliary obstruction Active Problems:   Essential hypertension   Chronic kidney disease (CKD) stage G2/A1, mildly decreased glomerular filtration rate (GFR) between 60-89 mL/min/1.73 square meter and albuminuria creatinine ratio less than 30 mg/g   Hypothyroidism   Dementia without behavioral disturbance (HCC)   Hyperlipidemia   Bacteremia   Choledocholithiasis Cholecystitis ?Cholangitis Bacteremia > Patient with altered mental status found to have fever to 103.1.  Did have low normal blood pressures and mildly elevated lactic acid initially but these have improved.  Tachycardia has also improved.  No  leukocytosis.  Imaging including MRCP consistent with cholecystitis and choledocholithiasis with gallbladder sludge.  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 > Blood cultures did show sensitive E. coli and 3 out of 4 bottles and patient remains on ceftriaxone and Flagyl for intra-abdominal infection/bacteremia. > Concern for possible cholangitis given presence of choledocholithiasis and bacteremia. > Initial LFTs showed AST 434, ALT 162, alk phos 997, T. bili 2.7. > Gastroenterology consulted from [facility] ED and notified of patient arrival. - Appreciate GI recommendations and assistance - May benefit from general surgery consult after initial treatment and pending ERCP findings (hold off for now) - Continue to monitor on telemetry - Continue with ceftriaxone, Flagyl - Plan for ERCP, timing per GI - N.p.o. for now until GI input is obtained - Repeat blood cultures - Follow-up current blood cultures for susceptibility - Trend fever curve and WBC - Trend LFTs  - Continue IV fluids - Supportive care  Hypertension - Holding home antihypertensives in the setting of developing infection  Hyperlipidemia - Continue home atorvastatin when able  Hypothyroidism - Continue home Synthroid unable  Dementia - Continue home donepezil, Namenda, fluoxetine, trazodone  when able  Anemia > Initially stable around 10.2, repeat now down to ending to 8.8, likely delusional.  Will obtain type and screen and continue to monitor CBC. - Type and screen - CBC.  CKD 2 > Creatinine stable in the ED - Trend renal function and electrolytes  DVT prophylaxis: SCDs for now Code Status:   Full Family Communication:  Updated at bedside  Disposition Plan:   Patient is from:  Home/[place] healthcare facility  Anticipated DC to:  Same as above  Anticipated DC date:  2-5 days  Anticipated DC barriers: None  Consults called:  Gastroenterology Admission status:  Patient, telemetry  Severity of  Illness: The appropriate patient status for this patient is INPATIENT. Inpatient status is judged to be reasonable and necessary in order to provide the required intensity of service to ensure the patient's safety. The patient's presenting symptoms, physical exam findings, and initial radiographic and laboratory data in the context of their chronic comorbidities is felt to place them at high risk for further clinical deterioration. Furthermore, it is not anticipated that the patient will be medically stable for discharge from the hospital within 2 midnights of admission.   * I certify that at the point of admission it is my clinical judgment that the patient will require inpatient hospital care spanning beyond 2 midnights from the point of admission due to high intensity of service, high risk for further deterioration and high frequency of surveillance required.Synetta Fail MD [facility]  How to contact the [facility] Attending or Consulting provider 7A - 7P or covering provider during after hours 7P -7A, for this patient?   Check the care team in [facility] and look for a) attending/consulting [facility] provider listed and b) the [facility] team listed Log into [email or web address] and use [place]'s universal password to access. If you do not have the password, please contact the hospital operator. Locate the [facility] provider you are looking for under [facility] and page to a number that you can be directly reached. If you still have difficulty reaching the provider, please page the [facility] (Director on Call) for the Hospitalists listed on amion for assistance.  03/30/2023, 4:05 PM

## 2023-03-30 NOTE — ED Notes (Signed)
Per Dr. ward [facility] is full and can't accept the pt. at this time, calling [facility].

## 2023-03-30 NOTE — Consult Note (Addendum)
Consultation  Referring Provider: Ward, Layla Maw, DO Primary Care Physician:  System, Provider Not In Primary Gastroenterologist:  Unassigned  Reason for Consultation: Fever, elevated LFTs, choledocholithiasis, cholangitis  HPI: Angel Cameron is a 82 y.o. female, who was transferred here this afternoon from [facility].  She had presented there to the emergency room yesterday from her assisted living facility after her daughter had gone to visit her and found patient having shaking chills and with altered mental status. Evaluation in the emergency room and patient to be febrile 103.1.Marland Kitchen  She met sepsis criteria, noted lactate to 2.1. She was started on ceftriaxone and metronidazole.  Noted to have elevated LFTs with T. bili 2.7/alk phos 997/AST 434 and ALT 162. INR 1.1 BUN 20/creatinine 0.76 WBC 7.5/hemoglobin 10.2/hematocrit of 32.  Had CT of the abdomen and pelvis, then MRI/MRCP.  Noted on MRI to have an irregular appearing liver consistent with cirrhosis, splenomegaly, common bile duct to 10 mm in the porta hepatis proximally and there are several filling defects in the distal common bile duct measuring up to 9 mm.  Cystic duct not well-seen but small filling defects in the cystic duct suggest probable stones, also noted to have small stones in gallbladder and gallbladder moderately distended with mild wall thickening and edema. 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  Today's labs are pending Blood cultures positive for E. Coli. Urine was also abnormal with greater than 50 WBCs/large leukocytes and urine cultures pending  Patient is somnolent and not easily arousable, unable to participate.  Her daughter is at bedside and offers the history.  She says her mom is usually conversant but somewhat confused, had been ambulatory until she had a fall with hip fracture in January. She has not been anticoagulated. Patient's daughter was unaware  of any prior diagnosis of gallstones.  No known history of liver disease.  No history of EtOH use.   Past Medical History:  Diagnosis Date   Hypertension    Memory loss     Past Surgical History:  Procedure Laterality Date   COLONOSCOPY     HERNIA REPAIR     HIP ARTHROPLASTY Right 06/28/2022   Procedure: ARTHROPLASTY BIPOLAR HIP (HEMIARTHROPLASTY);  Surgeon: Juanell Fairly, MD;  Location: [facility] ORS;  Service: Orthopedics;  Laterality: Right;    Prior to Admission medications   Medication Sig Start Date End Date Taking? Authorizing Provider  atorvastatin (LIPITOR) 10 MG tablet Take 10 mg by mouth daily.    [provider]  benazepril (LOTENSIN) 40 MG tablet Take 40 mg by mouth 2 (two) times daily.    [provider]  bisacodyl (DULCOLAX) 10 MG suppository Place 1 suppository (10 mg total) rectally daily as needed for moderate constipation. 07/04/22   Kirby Crigler, Mir M, MD  docusate sodium (COLACE) 100 MG capsule Take 1 capsule (100 mg total) by mouth 2 (two) times daily. 07/04/22   Kirby Crigler, Mir M, MD  donepezil (ARICEPT) 5 MG tablet Take 5 mg by mouth at bedtime.    [provider]  enoxaparin (LOVENOX) 40 MG/0.4ML injection Inject 0.4 mLs (40 mg total) into the skin daily for 21 days. 07/05/22 07/26/22  Kirby Crigler, Mir M, MD  FLUoxetine (PROZAC) 10 MG capsule Take 10 mg by mouth daily. 05/24/22   [provider]  HYDROcodone-acetaminophen (NORCO/VICODIN) 5-325 MG tablet Take 1-2 tablets by mouth every 4 (four) hours as needed for moderate pain (pain score 4-6). 07/04/22   Maryln Gottron, MD  levothyroxine (SYNTHROID, LEVOTHROID) 75 MCG tablet Take 75 mcg by mouth daily before breakfast.    [provider]  meclizine (ANTIVERT) 25 MG tablet Take 0.5 tablets (12.5 mg total) by mouth 2 (two) times daily as needed for dizziness. 09/02/19   Sharman Cheek, MD  memantine (NAMENDA) 5 MG tablet Take 5 mg by mouth daily.    [provider]   metoprolol succinate (TOPROL-XL) 50 MG 24 hr tablet Take 50 mg by mouth daily. 05/24/22   [provider]  Multiple Vitamin (MULTIVITAMIN WITH MINERALS) TABS tablet Take 1 tablet by mouth daily.    [provider]  Nutritional Supplements (,FEEDING SUPPLEMENT, PROSOURCE PLUS) liquid Take 30 mLs by mouth 2 (two) times daily between meals. 07/04/22   Kirby Crigler, Mir M, MD  omeprazole (PRILOSEC) 20 MG capsule Take 20 mg by mouth daily.    [provider]  traZODone (DESYREL) 50 MG tablet Take 1 tablet (50 mg total) by mouth at bedtime as needed for sleep. 07/04/22   Maryln Gottron, MD    Current Facility-Administered Medications  Medication Dose Route Frequency Provider Last Rate Last Admin   0.9 %  sodium chloride infusion   Intravenous Continuous Synetta Fail, MD       acetaminophen (TYLENOL) tablet 650 mg  650 mg Oral Q6H PRN Synetta Fail, MD       Or   acetaminophen (TYLENOL) suppository 650 mg  650 mg Rectal Q6H PRN Synetta Fail, MD       [START ON 03/31/2023] atorvastatin (LIPITOR) tablet 10 mg  10 mg Oral Daily Synetta Fail, MD       cefTRIAXone (ROCEPHIN) 2 g in sodium chloride 0.9 % 100 mL IVPB  2 g Intravenous Q24H Synetta Fail, MD       [START ON 03/31/2023] levothyroxine (SYNTHROID) tablet 75 mcg  75 mcg Oral QAC breakfast Synetta Fail, MD       metroNIDAZOLE (FLAGYL) IVPB 500 mg  500 mg Intravenous Q12H Synetta Fail, MD       [START ON 03/31/2023] pantoprazole (PROTONIX) EC tablet 40 mg  40 mg Oral Daily Synetta Fail, MD       polyethylene glycol (MIRALAX / GLYCOLAX) packet 17 g  17 g Oral Daily PRN Synetta Fail, MD       sodium chloride flush (NS) 0.9 % injection 3 mL  3 mL Intravenous Q12H Synetta Fail, MD        Allergies as of 03/30/2023   (No Known Allergies)    Family History  Problem Relation Age of Onset   Diabetes Mother    Dementia Mother    Heart failure Father      Social History   Socioeconomic History   Marital status: Married    Spouse name: Not on file   Number of children: Not on file   Years of education: Not on file   Highest education level: Not on file  Occupational History   Not on file  Tobacco Use   Smoking status: Never   Smokeless tobacco: Never  Substance and Sexual Activity   Alcohol use: Not Currently   Drug use: Never   Sexual activity: Not Currently  Other Topics Concern   Not on file  Social History Narrative   Not on file   Social Determinants of Health   Financial Resource Strain: Not on file  Food Insecurity: Food Insecurity Present (07/04/2022)   Hunger Vital Sign  Worried About Programme researcher, broadcasting/film/video in the Last Year: Often true    Ran Out of Food in the Last Year: Often true  Transportation Needs: No Transportation Needs (07/04/2022)   PRAPARE - Administrator, Civil Service (Medical): No    Lack of Transportation (Non-Medical): No  Physical Activity: Not on file  Stress: Not on file  Social Connections: Not on file  Intimate Partner Violence: Not At Risk (07/04/2022)   Humiliation, Afraid, Rape, and Kick questionnaire    Fear of Current or Ex-Partner: No    Emotionally Abused: No    Physically Abused: No    Sexually Abused: No    Review of Systems: Pertinent positive and negative review of systems were noted in the above HPI section.  All other review of systems was otherwise negative.   Physical Exam: Vital signs in last 24 hours: Temp:  [98.4 F (36.9 C)-103.1 F (39.5 C)] 98.4 F (36.9 C) (10/12 1521) Pulse Rate:  [66-111] 66 (10/12 1521) Resp:  [9-24] 16 (10/12 1521) BP: (101-147)/(51-113) 132/69 (10/12 1521) SpO2:  [94 %-100 %] 94 % (10/12 1521) Weight:  [52 kg-56.7 kg] 52 kg (10/12 1524) Last BM Date : 03/28/23 General:   Alert,  Well-developed, thin frail appearing elderly African-American female, obtunded, arouses easily to exam not conversant Head:  Normocephalic and  atraumatic.  Normal neck ROM Eyes:  Sclera icteric   conjunctiva pink.  she has an ecchymosis around the right orbit (recent fall) Ears:  Normal auditory acuity. Nose:  No deformity, discharge,  or lesions. Mouth:  No deformity or lesions.  Oral opening and dental condition sufficient for scope passage Neck:  Supple; no masses or thyromegaly. Lungs:  Clear throughout to auscultation.   No wheezes, crackles, or rhonchi.  Heart:  Regular rate and rhythm; no murmurs, clicks, rubs,  or gallops. Abdomen:  Soft, nondistended, bowel sounds are present, she is tender to palpation of the epigastrium no rebound.   Rectal: Not done Msk:  Symmetrical without gross deformities. . Pulses:  Normal pulses noted. Extremities:  Without clubbing or edema. Neurologic: Somnolent, unable to arouse, grimaces to exam of the abdomen, some soft mumbling Skin:  Intact without significant lesions or rashes..  Intake/Output from previous day: No intake/output data recorded. Intake/Output this shift: No intake/output data recorded.  Lab Results: Recent Labs    03/29/23 1746  WBC 7.5  HGB 10.2*  HCT 32.0*  PLT 201   BMET Recent Labs    03/29/23 1746  NA 136  K 4.2  CL 100  CO2 27  GLUCOSE 212*  BUN 24*  CREATININE 0.76  CALCIUM 8.7*   LFT Recent Labs    03/29/23 1746  PROT 6.4*  ALBUMIN 3.3*  AST 434*  ALT 162*  ALKPHOS 997*  BILITOT 2.7*   PT/INR Recent Labs    03/29/23 1746  LABPROT 14.7  INR 1.1   Hepatitis Panel No results for input(s): "HEPBSAG", "HCVAB", "HEPAIGM", "HEPBIGM" in the last 72 hours.  IMPRESSION:  #18 82 year old African-American female with history of dementia, resident of an assisted living facility who presented to the emergency room yesterday at [facility] after being found to have a shaking chill/rigor altered mental status. Febrile in the ER to 103.1 Met sepsis criteria, no hypotension. Workup revealing for elevated LFTs, and CT abdomen and pelvis  and then MRI/MRCP revealing evidence of cirrhosis and splenomegaly, as well as a dilated common bile duct to 10 mm proximally, and evidence of several  filling defects in the distal common bile duct up to 9 mm, likely stones in the cystic duct, as well as in the gallbladder.  There is some mild gallbladder wall thickening/edema, cannot rule out cholecystitis.  Cultures returning positive for E. Coli  #2 Patient also found to have UTI. #3 anemia mild normocytic #4 Status post hip fracture #5 history of hypertension    PLAN: Clear liquids tonight if alert enough to take,, n.p.o. after midnight Patient has been scheduled for ERCP with stone extraction with Dr. Russella Dar for tomorrow 03/31/2023.  Procedure was discussed in detail with her daughter at bedside including indications risks and benefits and she is agreeable to proceed. Daughter signed consent at bedside.  Continue Rocephin, discontinue metronidazole Hold Lovenox until after ERCP Repeat labs in a.m. Because of concerns for cholelithiasis and possible mild cholecystitis she will also need surgical consultation, that can wait until after ERCP. GI will follow with you   Amy EsterwoodPA-C  03/30/2023, 4:24 PM  I have taken an interval history, thoroughly reviewed the chart and examined the patient. I agree with the Advanced Practitioner's note, impression and recommendations, and have recorded additional findings, impressions and recommendations below. I performed a substantive portion of this encounter (>50% time spent), including a complete performance of the medical decision making.  My additional thoughts are as follows:  Elderly woman with cholangitis and choledocholithiasis.  Clinically stable on antibiotics, marked LFT elevation and definite stones in the CBD on imaging.  Bacteremic with E. coli.  Somnolent today, but altered mental status from reported baseline according to the daughter.  This is due to her acute illness with  underlying dementia.  Additional diagnosis would be abnormal imaging of the GI tract suggesting morphologic changes suggestive of cirrhosis with splenomegaly.  There is dilatation of the portal vein, though no reported intra-abdominal or perigastric/paraesophageal varices noted.  Platelets, coags and last prehospitalization LFTs in January of this year were normal.   The plan is for ERCP tomorrow with our biliary endoscopist Dr. Claudette Head.  Procedure described in detail along with description of the anatomy and technical aspects along with risks and benefits and her daughter Angel Cameron was agreeable.  The benefits and risks of the planned procedure were described in detail with the patient or (when appropriate) their health care proxy.  Risks were outlined as including, but not limited to, bleeding, infection, perforation, adverse medication reaction leading to cardiac or pulmonary decompensation, pancreatitis (if ERCP).  The limitation of incomplete mucosal visualization was also discussed.  No guarantees or warranties were given.  Patient at increased risk for cardiopulmonary complications of procedure due to medical comorbidities. Consent was obtained and signed by her daughter and me in the presence of her nurse as a witness.  I will be sure Dr. Russella Dar is also aware of the imaging findings suggesting cirrhosis, and he will most likely start the procedure with a standard upper endoscope to assess for any esophageal or gastric varices prior to passing the duodenoscope.  Charlie Pitter III Office:[phone]

## 2023-03-30 NOTE — ED Notes (Signed)
Spoke with rep Elissa Lovett from [facility] faxed demo sheet, power shared images, and transferred to Dr. Elesa Massed for further consult.

## 2023-03-30 NOTE — ED Notes (Signed)
Called [facility] spoke with rep. Ruby gave her brief info about the pt. she stated she would give me a call back.

## 2023-03-30 NOTE — Progress Notes (Addendum)
Plan of Care Note for accepted transfer  Patient: Angel Cameron              ZOX:[id number]  DOA: 03/29/2023     Facility requesting transfer: [facility] Requesting Provider:   Reason for transfer: Sepsis and choledocholithiasis need for ERCP.  Facility course:  Angel Cameron is a 82 y.o. female past medical history of hyperlipidemia, hypertension, chronic constipation, generalized anxiety disorder, hypothyroidism, dementia, and GERD who presented to emergency department from a long-term facility care for evaluation for altered mentation, fever and nausea. Patient is altered at baseline however daughter states that that she has become more confused over the last 24 hours.  Patient was also found to have a fever. ROS: Patient currently denies any vision changes, tinnitus, difficulty speaking, facial droop, sore throat, chest pain, shortness of breath, abdominal pain, nausea/vomiting/diarrhea, dysuria, or weakness/numbness/paresthesias in any extremity   At presentation to ED patient found to have high temperature 103 F, tachycardic 111, tachypneic 24 and borderline low blood pressure 170/55. CBC grossly unremarkable stable H&H 9.4 and 31. CMP showing elevated blood glucose 212, elevated BUN 24, elevated AST 434, elevated ALT 162, elevated alkaline phos.  997 and elevated bilirubin 2.7 otherwise unremarkable. Respiratory panel negative. Initial lactic acid 2.1 which has been improved to 1.2. UA showed leukocyte esterase positive and few bacteria.  Pending urine culture. Patient blood cultures.  Chest x-ray no acute active disease process. Rounded density in the right hilum felt to reflect right hilar vasculature. This could be confirmed with chest CT with IV contrast.  CT head no acute intracranial abnormality.  Right upper quadrant ultrasound: IMPRESSION: 1. Cholelithiasis with additional findings that may represent a large amount of tumefactive sludge within  the gallbladder lumen. Correlation with nonemergent MRI is recommended to further exclude the presence of an underlying neoplastic process. 2. Dilated common bile duct with intrahepatic biliary dilatation. MRCP is recommended to exclude presence of an obstructing lesion. 3. Hepatic steatosis without focal liver lesions.  MRCP has been ordered in the ED and waiting for official reading.  Per Dr. Elesa Massed MRCP showing choledocholithiasis however there is no concern for cholecystitis.  I have requested Dr. Elesa Massed to speak with on-call gastroenterology first.  If GI will accept the patient and based on gastroenterology recommendation I will go go ahead and accept the patient as well.  In the ED patient has been given IV ceftriaxone 2 g once and metronidazole 500 mg once.  Patient also resuscitated with 1 L of LR bolus and currently on LR 150 cc/h.  Update, Dr. Elesa Massed spoke with lower GI Dr. Russella Dar who recommended admit patient under hospitalist care and upon arrival to the hospital need to inform GI about this patient.   Plan of care: The patient is accepted for admission for inpatient status to Telemetry unit, at [facility]  Check [email or web address] for on-call coverage.  [facility] will assume care on arrival to accepting facility. Until arrival, medical decision making responsibilities remain with the EDP.  However, [facility] available 24/7 for questions and assistance.   Nursing staff please page [facility] Admits and Consults [phone]) as soon as the patient arrives to the hospital.    Author: Tereasa Coop, MD  03/30/2023  [facility]

## 2023-03-30 NOTE — Progress Notes (Signed)
   E. coli bacteremia: Pharmacy is informing me that, lab called pharmacy and informed patient's blood culture growing E. coli in 3 out of 4 bottles no resistance dictated.  Currently on ceftriaxone 2 g IV daily. - Plan to continue same antibiotic with IV ceftriaxone 2 g daily.   Tereasa Coop, MD [facility] 03/30/2023, 6:41 AM

## 2023-03-30 NOTE — Progress Notes (Signed)
PHARMACY - PHYSICIAN COMMUNICATION CRITICAL VALUE ALERT - BLOOD CULTURE IDENTIFICATION (BCID)  Angel Cameron is an 82 y.o. female who presented to [facility] on 03/29/2023 with a chief complaint of choledocholithiasis   Assessment:  E coli in 3 of 4 bottles, no resistance detected (include suspected source if known)  Name of physician (or Provider) Contacted: Eulah Pont Sundil  Current antibiotics: Ceftriaxone 2 gm IV Q24H   Changes to prescribed antibiotics recommended:  Patient is on recommended antibiotics - No changes needed  Results for orders placed or performed during the hospital encounter of 03/29/23  Blood Culture ID Panel (Reflexed) (Collected: 03/29/2023  5:46 PM)  Result Value Ref Range   Enterococcus faecalis NOT DETECTED NOT DETECTED   Enterococcus Faecium NOT DETECTED NOT DETECTED   Listeria monocytogenes NOT DETECTED NOT DETECTED   Staphylococcus species NOT DETECTED NOT DETECTED   Staphylococcus aureus (BCID) NOT DETECTED NOT DETECTED   Staphylococcus epidermidis NOT DETECTED NOT DETECTED   Staphylococcus lugdunensis NOT DETECTED NOT DETECTED   Streptococcus species NOT DETECTED NOT DETECTED   Streptococcus agalactiae NOT DETECTED NOT DETECTED   Streptococcus pneumoniae NOT DETECTED NOT DETECTED   Streptococcus pyogenes NOT DETECTED NOT DETECTED   A.calcoaceticus-baumannii NOT DETECTED NOT DETECTED   Bacteroides fragilis NOT DETECTED NOT DETECTED   Enterobacterales DETECTED (A) NOT DETECTED   Enterobacter cloacae complex NOT DETECTED NOT DETECTED   Escherichia coli DETECTED (A) NOT DETECTED   Klebsiella aerogenes NOT DETECTED NOT DETECTED   Klebsiella oxytoca NOT DETECTED NOT DETECTED   Klebsiella pneumoniae NOT DETECTED NOT DETECTED   Proteus species NOT DETECTED NOT DETECTED   Salmonella species NOT DETECTED NOT DETECTED   Serratia marcescens NOT DETECTED NOT DETECTED   Haemophilus influenzae NOT DETECTED NOT DETECTED   Neisseria meningitidis NOT DETECTED  NOT DETECTED   Pseudomonas aeruginosa NOT DETECTED NOT DETECTED   Stenotrophomonas maltophilia NOT DETECTED NOT DETECTED   Candida albicans NOT DETECTED NOT DETECTED   Candida auris NOT DETECTED NOT DETECTED   Candida glabrata NOT DETECTED NOT DETECTED   Candida krusei NOT DETECTED NOT DETECTED   Candida parapsilosis NOT DETECTED NOT DETECTED   Candida tropicalis NOT DETECTED NOT DETECTED   Cryptococcus neoformans/gattii NOT DETECTED NOT DETECTED   CTX-M ESBL NOT DETECTED NOT DETECTED   Carbapenem resistance IMP NOT DETECTED NOT DETECTED   Carbapenem resistance KPC NOT DETECTED NOT DETECTED   Carbapenem resistance NDM NOT DETECTED NOT DETECTED   Carbapenem resist OXA 48 LIKE NOT DETECTED NOT DETECTED   Carbapenem resistance VIM NOT DETECTED NOT DETECTED    Shatima Zalar D 03/30/2023  6:41 AM

## 2023-03-31 ENCOUNTER — Inpatient Hospital Stay (HOSPITAL_COMMUNITY): Payer: Medicare HMO

## 2023-03-31 ENCOUNTER — Inpatient Hospital Stay (HOSPITAL_COMMUNITY): Payer: Medicare HMO | Admitting: Certified Registered Nurse Anesthetist

## 2023-03-31 ENCOUNTER — Encounter (HOSPITAL_COMMUNITY)
Admission: EM | Disposition: A | Discharge status: Home / Self Care | Payer: Self-pay | Source: Other Acute Inpatient Hospital | Attending: Family Medicine

## 2023-03-31 ENCOUNTER — Encounter (HOSPITAL_COMMUNITY): Payer: Self-pay | Admitting: Internal Medicine

## 2023-03-31 DIAGNOSIS — K831 Obstruction of bile duct: Secondary | ICD-10-CM

## 2023-03-31 DIAGNOSIS — R7989 Other specified abnormal findings of blood chemistry: Secondary | ICD-10-CM

## 2023-03-31 DIAGNOSIS — K8051 Calculus of bile duct without cholangitis or cholecystitis with obstruction: Secondary | ICD-10-CM

## 2023-03-31 DIAGNOSIS — E039 Hypothyroidism, unspecified: Secondary | ICD-10-CM

## 2023-03-31 DIAGNOSIS — R933 Abnormal findings on diagnostic imaging of other parts of digestive tract: Secondary | ICD-10-CM

## 2023-03-31 DIAGNOSIS — K8030 Calculus of bile duct with cholangitis, unspecified, without obstruction: Secondary | ICD-10-CM | POA: Diagnosis not present

## 2023-03-31 DIAGNOSIS — K8033 Calculus of bile duct with acute cholangitis with obstruction: Secondary | ICD-10-CM

## 2023-03-31 HISTORY — PX: REMOVAL OF STONES: SHX5545

## 2023-03-31 HISTORY — PX: ERCP: SHX5425

## 2023-03-31 HISTORY — PX: SPHINCTEROTOMY: SHX5544

## 2023-03-31 LAB — CBC
HCT: 27.5 % — ABNORMAL LOW (ref 36.0–46.0)
Hemoglobin: 8.7 g/dL — ABNORMAL LOW (ref 12.0–15.0)
MCH: 27.5 pg (ref 26.0–34.0)
MCHC: 31.6 g/dL (ref 30.0–36.0)
MCV: 87 fL (ref 80.0–100.0)
Platelets: 166 10*3/uL (ref 150–400)
RBC: 3.16 MIL/uL — ABNORMAL LOW (ref 3.87–5.11)
RDW: 13.9 % (ref 11.5–15.5)
WBC: 4.6 10*3/uL (ref 4.0–10.5)
nRBC: 0 % (ref 0.0–0.2)

## 2023-03-31 LAB — COMPREHENSIVE METABOLIC PANEL
ALT: 87 U/L — ABNORMAL HIGH (ref 0–44)
AST: 89 U/L — ABNORMAL HIGH (ref 15–41)
Albumin: 2.5 g/dL — ABNORMAL LOW (ref 3.5–5.0)
Alkaline Phosphatase: 595 U/L — ABNORMAL HIGH (ref 38–126)
Anion gap: 12 (ref 5–15)
BUN: 12 mg/dL (ref 8–23)
CO2: 26 mmol/L (ref 22–32)
Calcium: 8.4 mg/dL — ABNORMAL LOW (ref 8.9–10.3)
Chloride: 101 mmol/L (ref 98–111)
Creatinine, Ser: 0.83 mg/dL (ref 0.44–1.00)
GFR, Estimated: >60 mL/min (ref >60)
Glucose, Bld: 63 mg/dL — ABNORMAL LOW (ref 70–99)
Potassium: 3.6 mmol/L (ref 3.5–5.1)
Sodium: 139 mmol/L (ref 135–145)
Total Bilirubin: 1.4 mg/dL — ABNORMAL HIGH (ref 0.3–1.2)
Total Protein: 5.1 g/dL — ABNORMAL LOW (ref 6.5–8.1)

## 2023-03-31 SURGERY — ERCP, WITH INTERVENTION IF INDICATED
Anesthesia: General

## 2023-03-31 MED ORDER — PROPOFOL 10 MG/ML IV BOLUS
INTRAVENOUS | Status: DC | PRN
  Administered 2023-03-31: 70 mg via INTRAVENOUS

## 2023-03-31 MED ORDER — ONDANSETRON HCL 4 MG/2ML IJ SOLN
INTRAMUSCULAR | Status: DC | PRN
  Administered 2023-03-31: 4 mg via INTRAVENOUS

## 2023-03-31 MED ORDER — FENTANYL CITRATE (PF) 100 MCG/2ML IJ SOLN
INTRAMUSCULAR | Status: AC
  Filled 2023-03-31: qty 2

## 2023-03-31 MED ORDER — DICLOFENAC SUPPOSITORY 100 MG: 100.0000 mg | Freq: Once | RECTAL | Status: DC

## 2023-03-31 MED ORDER — FENTANYL CITRATE (PF) 100 MCG/2ML IJ SOLN
INTRAMUSCULAR | Status: DC | PRN
Start: 2023-03-31 — End: 2023-03-31
  Administered 2023-03-31: 50 ug via INTRAVENOUS
  Administered 2023-03-31 (×2): 25 ug via INTRAVENOUS

## 2023-03-31 MED ORDER — ROCURONIUM BROMIDE 10 MG/ML (PF) SYRINGE
PREFILLED_SYRINGE | INTRAVENOUS | Status: DC | PRN
  Administered 2023-03-31: 40 mg via INTRAVENOUS

## 2023-03-31 MED ORDER — LACTATED RINGERS IV SOLN
INTRAVENOUS | Status: AC | PRN
Start: 2023-03-31 — End: 2023-03-31
  Administered 2023-03-31: 10 mL/h via INTRAVENOUS

## 2023-03-31 MED ORDER — DEXAMETHASONE SODIUM PHOSPHATE 10 MG/ML IJ SOLN
INTRAMUSCULAR | Status: DC | PRN
  Administered 2023-03-31: 4 mg via INTRAVENOUS

## 2023-03-31 MED ORDER — GLUCAGON HCL RDNA (DIAGNOSTIC) 1 MG IJ SOLR
INTRAMUSCULAR | Status: DC | PRN
Start: 2023-03-31 — End: 2023-03-31
  Administered 2023-03-31 (×2): .25 mg via INTRAVENOUS

## 2023-03-31 MED ORDER — DICLOFENAC SUPPOSITORY 100 MG
RECTAL | Status: AC
  Filled 2023-03-31: qty 1

## 2023-03-31 MED ORDER — METOPROLOL TARTRATE 5 MG/5ML IV SOLN: 2.5000 mg | INTRAVENOUS | Status: DC

## 2023-03-31 MED ORDER — METRONIDAZOLE 500 MG/100ML IV SOLN
500.0000 mg | Freq: Two times a day (BID) | INTRAVENOUS | Status: DC
  Administered 2023-03-31: 500 mg via INTRAVENOUS
  Filled 2023-03-31: qty 100

## 2023-03-31 MED ORDER — DEXTROSE IN LACTATED RINGERS 5 % IV SOLN: INTRAVENOUS | Status: DC

## 2023-03-31 MED ORDER — SODIUM CHLORIDE 0.9 % IV SOLN
INTRAVENOUS | Status: DC | PRN
  Administered 2023-03-31: 20 mL

## 2023-03-31 MED ORDER — FENTANYL CITRATE (PF) 100 MCG/2ML IJ SOLN: 25.0000 ug | INTRAMUSCULAR | Status: DC | PRN

## 2023-03-31 MED ORDER — METOPROLOL TARTRATE 5 MG/5ML IV SOLN
2.5000 mg | INTRAVENOUS | Status: AC
  Administered 2023-03-31: 2.5 mg via INTRAVENOUS
  Filled 2023-03-31: qty 5

## 2023-03-31 MED ORDER — LIDOCAINE HCL (CARDIAC) PF 100 MG/5ML IV SOSY
PREFILLED_SYRINGE | INTRAVENOUS | Status: DC | PRN
  Administered 2023-03-31: 60 mg via INTRAVENOUS

## 2023-03-31 MED ORDER — SODIUM CHLORIDE 0.9 % IV SOLN
2.0000 g | INTRAVENOUS | Status: DC
  Administered 2023-04-01 – 2023-04-02 (×2): 2 g via INTRAVENOUS
  Filled 2023-03-31 (×2): qty 20

## 2023-03-31 MED ORDER — DICLOFENAC SUPPOSITORY 100 MG
RECTAL | Status: DC | PRN
  Administered 2023-03-31: 100 mg via RECTAL

## 2023-03-31 MED ORDER — SUGAMMADEX SODIUM 200 MG/2ML IV SOLN
INTRAVENOUS | Status: DC | PRN
  Administered 2023-03-31: 200 mg via INTRAVENOUS

## 2023-03-31 NOTE — Anesthesia Preprocedure Evaluation (Signed)
Anesthesia Evaluation  Patient identified by MRN, date of birth, ID band Patient confused    Reviewed: Allergy & Precautions, NPO status , Patient's Chart, lab work & pertinent test results  Airway Mallampati: II  TM Distance: >3 FB Neck ROM: Full    Dental  (+) Upper Dentures   Pulmonary neg pulmonary ROS   Pulmonary exam normal        Cardiovascular hypertension, Pt. on medications and Pt. on home beta blockers  Rhythm:Regular Rate:Normal     Neuro/Psych       Dementia negative neurological ROS     GI/Hepatic Neg liver ROS,,,Cholangitis    Endo/Other  Hypothyroidism    Renal/GU   negative genitourinary   Musculoskeletal negative musculoskeletal ROS (+)    Abdominal Normal abdominal exam  (+)   Peds  Hematology Lab Results      Component                Value               Date                      WBC                      4.6                 03/31/2023                HGB                      8.7 (L)             03/31/2023                HCT                      27.5 (L)            03/31/2023                MCV                      87.0                03/31/2023                PLT                      166                 03/31/2023             Lab Results      Component                Value               Date                      NA                       139                 03/31/2023                K  3.6                 03/31/2023                CO2                      26                  03/31/2023                GLUCOSE                  63 (L)              03/31/2023                BUN                      12                  03/31/2023                CREATININE               0.83                03/31/2023                CALCIUM                  8.4 (L)             03/31/2023                GFRNONAA                 >60                 03/31/2023              Anesthesia Other  Findings   Reproductive/Obstetrics                             Anesthesia Physical Anesthesia Plan  ASA: 3  Anesthesia Plan: General   Post-op Pain Management:    Induction: Intravenous  PONV Risk Score and Plan: 3 and Ondansetron and Treatment may vary due to age or medical condition  Airway Management Planned: Mask and Oral ETT  Additional Equipment: None  Intra-op Plan:   Post-operative Plan: Extubation in OR  Informed Consent: I have reviewed the patients History and Physical, chart, labs and discussed the procedure including the risks, benefits and alternatives for the proposed anesthesia with the patient or authorized representative who has indicated his/her understanding and acceptance.     Consent reviewed with POA and Dental advisory given  Plan Discussed with: CRNA  Anesthesia Plan Comments:        Anesthesia Quick Evaluation

## 2023-03-31 NOTE — Anesthesia Postprocedure Evaluation (Signed)
Anesthesia Post Note  Patient: Angel Cameron  Procedure(s) Performed: ENDOSCOPIC RETROGRADE CHOLANGIOPANCREATOGRAPHY (ERCP) SPHINCTEROTOMY REMOVAL OF STONES and SLUDGE     Patient location during evaluation: PACU Anesthesia Type: General Level of consciousness: awake and alert Pain management: pain level controlled Vital Signs Assessment: post-procedure vital signs reviewed and stable Respiratory status: spontaneous breathing, nonlabored ventilation, respiratory function stable and patient connected to nasal cannula oxygen Cardiovascular status: blood pressure returned to baseline and stable Postop Assessment: no apparent nausea or vomiting Anesthetic complications: no   No notable events documented.  Last Vitals:  Vitals:   03/31/23 1155 03/31/23 1220  BP: 133/65 129/62  Pulse: 87 87  Resp: 17 16  Temp:  36.9 C  SpO2: 93% 93%    Last Pain:  Vitals:   03/31/23 1220  TempSrc: Oral  PainSc:                  Nelle Don Monti Villers

## 2023-03-31 NOTE — Anesthesia Procedure Notes (Addendum)
Procedure Name: Intubation Date/Time: 03/31/2023 10:32 AM  Performed by: Sandie Ano, CRNAPre-anesthesia Checklist: Patient identified, Emergency Drugs available, Suction available and Patient being monitored Patient Re-evaluated:Patient Re-evaluated prior to induction Oxygen Delivery Method: Circle System Utilized Preoxygenation: Pre-oxygenation with 100% oxygen Induction Type: IV induction Ventilation: Mask ventilation without difficulty Laryngoscope Size: Mac and 3 Grade View: Grade I Tube type: Oral Tube size: 7.0 mm Number of attempts: 1 Airway Equipment and Method: Stylet and Oral airway Placement Confirmation: ETT inserted through vocal cords under direct vision, positive ETCO2 and breath sounds checked- equal and bilateral Secured at: 21 cm Tube secured with: Tape Dental Injury: Teeth and Oropharynx as per pre-operative assessment

## 2023-03-31 NOTE — Interval H&P Note (Signed)
History and Physical Interval Note:  03/31/2023 10:06 AM  [name]  has presented today for surgery, with the diagnosis of Common bile duct stones, cholangitis.  The various methods of treatment have been discussed with the patient and family. After consideration of risks, benefits and other options for treatment, the patient has consented to  Procedure(s): ENDOSCOPIC RETROGRADE CHOLANGIOPANCREATOGRAPHY (ERCP) (N/A) as a surgical intervention.  The patient's history has been reviewed, patient examined, no change in status, stable for surgery.  I have reviewed the patient's chart and labs.  Questions were answered to the patient's satisfaction.     Venita Lick. Russella Dar

## 2023-03-31 NOTE — Progress Notes (Addendum)
  Bedside nurse reporting that patient has few runs of atrial fibrillation which has been converted to sinus rhythm.  Blood pressure is 152/77. -Per chart review patient has been admitted for severe sepsis secondary to acute cholangitis and early acute cholecystitis with choledocholithiasis. -Atrial fibrillation secondary to the setting of sepsis.  Currently on broad-spectrum antibiotic ceftriaxone.  I am adding metronidazole in the setting of intra-abdominal infection.  Also starting D5 LR 75 cc/h as patient patient will be n.p.o. after midnight for possible surgical intervention tomorrow.  Addendum - EKG showing atrial fibrillation heart rate 85.  Given atrial fibrillation in setting of sepsis no need for anticoagulation at this time.  Giving low-dose Lopressor IV 2.5 mg once to hold if blood pressure is less than 130/80 and her trend below 80.  Marland KitchenTereasa Coop, MD [facility] 03/31/2023, 8:08 PM

## 2023-03-31 NOTE — Progress Notes (Signed)
PROGRESS NOTE    Angel Cameron  JWJ:[id number] DOB: [birth year] DOA: 03/30/2023 PCP: System, Provider Not In   Brief Narrative:  HPI: Angel Cameron is a 82 y.o. female with medical history significant of hypertension, hyperlipidemia, hypothyroidism, dementia, CKD 2 presenting with altered mental status from facility.   History obtained with assistance of chart review and family.  Patient reportedly was near baseline per staff in the morning of 10/11 but later developed worsening confusion.  Has been found to be febrile as well.  Patient has baseline dementia but has been more confused than baseline per family.   Unable to fully participate in review of systems due to altered mentation baseline dementia.   ED Course: Vital signs in the ED notable for fever to 103.1, blood pressure in the 100s to 140 systolic, heart rate in the 60s to 100s.  Lab workup included CMP with BUN 24, glucose 212, calcium 8.7, protein 6.4, albumin 3.3, AST 434, ALT 162, alk phos 997, T. bili 2.7.  Lactic acid initially elevated at 2.1 with repeat improved to normal.  CBC showed stable hemoglobin at 10.2 and no leukocytosis.  PT and INR normal.  Respiratory panel for flu COVID RSV negative.  Urinalysis with leukocytes and bacteria only.  Urine culture pending.  Blood culture came back with E. coli without initial notable resistance in 3 out of 4 bottles.  Chest x-ray showed no acute normality.  CT head showed no acute normality.  Right upper quadrant ultrasound showed cholelithiasis with gallbladder sludge and dilated common bile duct with recommendation for MRCP.  MRCP showed choledocholithiasis, early cholecystitis, several stones at distal common bile duct, moderate distention of gallbladder with mild gallbladder wall thickening, changes consistent with early cirrhosis, evidence of portal hypertension.  GI consulted they are recommending transfer to facility able to accommodate ERCP sooner than later.  [facility] were  at capacity and patient was accepted [facility] after GI here has agreed to see patient on arrival.  Assessment & Plan:   Principal Problem:   Biliary obstruction Active Problems:   Essential hypertension   Chronic kidney disease (CKD) stage G2/A1, mildly decreased glomerular filtration rate (GFR) between 60-89 mL/min/1.73 square meter and albuminuria creatinine ratio less than 30 mg/g   Hypothyroidism   Dementia without behavioral disturbance (HCC)   Hyperlipidemia   Bacteremia   Calculus of bile duct with acute cholangitis with obstruction   Elevated LFTs   Abnormal magnetic resonance cholangiopancreatography (MRCP)  Severe sepsis secondary to acute cholangitis and early acute cholecystitis with choledocholithiasis/gram-negative bacteremia, hyperbilirubinemia/transaminitis, POA:  Patient met criteria for severe sepsis based on fever to 103.1, tachypnea, tachycardia, lactic acid of greater than 2 and acute encephalopathy.   Imaging including MRCP consistent with cholecystitis and choledocholithiasis with gallbladder sludge.  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 Blood cultures did show sensitive E. coli and 3 out of 4 bottles and out of Concern for possible cholangitis given presence of choledocholithiasis and bacteremia, patient was started on Rocephin and Flagyl, Rocephin was later discontinued by GI.  LFTs are improving now.  Patient is scheduled to have ERCP by GI today.  Will trend LFTs on daily basis.  Will need cholecystectomy.  Will consult general surgery.   Hypertension - Holding home antihypertensives with borderline low blood pressure in the setting of developing infection   Hyperlipidemia - Continue home atorvastatin when able   Hypothyroidism - Continue home Synthroid unable   Advanced dementia with patient  residing in memory care: - Continue home donepezil, Namenda, fluoxetine, trazodone when able   Anemia of chronic  disease: Baseline around 10, currently 8.7.  No signs of bleeding.  Monitor daily.   CKD 2 ruled out.  Patient does not have CKD.  Moderate protein calorie malnutrition: Hypoalbuminemia.  Will consult dietitian.  DVT prophylaxis: SCDs Start: 03/30/23 1559   Code Status: Full Code  Family Communication: Son and daughter present at bedside.  Plan of care discussed with patient in length and he/she verbalized understanding and agreed with it.  Status is: Inpatient Remains inpatient appropriate because: Scheduled for ERCP and will eventually need cholecystectomy.   Estimated body mass index is 20.31 kg/m as calculated from the following:   Height as of 03/29/23: 5\' 3"  (1.6 m).   Weight as of this encounter: 52 kg.    Nutritional Assessment: Body mass index is 20.31 kg/m.Marland Kitchen Seen by dietician.  I agree with the assessment and plan as outlined below: Nutrition Status:        . Skin Assessment: I have examined the patient's skin and I agree with the wound assessment as performed by the wound care RN as outlined below:    Consultants:  GI on board, consulting general surgery today.  Procedures:  Above  Antimicrobials:  Anti-infectives (From admission, onward)    Start     Dose/Rate Route Frequency Ordered Stop   03/30/23 1800  cefTRIAXone (ROCEPHIN) 2 g in sodium chloride 0.9 % 100 mL IVPB        2 g 200 mL/hr over 30 Minutes Intravenous Every 24 hours 03/30/23 1549     03/30/23 1645  metroNIDAZOLE (FLAGYL) IVPB 500 mg  Status:  Discontinued        500 mg 100 mL/hr over 60 Minutes Intravenous Every 12 hours 03/30/23 1549 03/30/23 1700         Subjective: Patient seen and examined.  Daughter and son at the bedside.  Patient was alert and oriented to self.  She is at her baseline as verified by the children at the bedside.  Patient appeared very comfortable and she did not offer any complaints.  Objective: Vitals:   03/31/23 1135 03/31/23 1145 03/31/23 1155 03/31/23  1220  BP: (!) 132/57 (!) 130/55 133/65 129/62  Pulse: 92 87 87 87  Resp: 14 14 17 16   Temp:    98.4 F (36.9 C)  TempSrc:    Oral  SpO2: 94% 93% 93% 93%  Weight:        Intake/Output Summary (Last 24 hours) at 03/31/2023 1230 Last data filed at 03/31/2023 1101 Gross per 24 hour  Intake 1582.35 ml  Output --  Net 1582.35 ml   Filed Weights   03/30/23 1524  Weight: 52 kg    Examination:  General exam: Appears calm and comfortable  Respiratory system: Clear to auscultation. Respiratory effort normal. Cardiovascular system: S1 & S2 heard, RRR. No JVD, murmurs, rubs, gallops or clicks. No pedal edema. Gastrointestinal system: Abdomen is nondistended, soft and nontender. No organomegaly or masses felt. Normal bowel sounds heard. Central nervous system: Alert and orientedx1. No focal neurological deficits. Extremities: Symmetric 5 x 5 power. Skin: No rashes, lesions or ulcers.    Data Reviewed: I have personally reviewed following labs and imaging studies  CBC: Recent Labs  Lab 03/29/23 1746 03/30/23 1618 03/31/23 0458  WBC 7.5 4.8 4.6  NEUTROABS 6.5  --   --   HGB 10.2* 8.8* 8.7*  HCT 32.0* 27.5* 27.5*  MCV 89.1 86.8 87.0  PLT 201 175 166   Basic Metabolic Panel: Recent Labs  Lab 03/29/23 1746 03/30/23 1618 03/31/23 0458  NA 136 140 139  K 4.2 3.4* 3.6  CL 100 100 101  CO2 27 30 26   GLUCOSE 212* 83 63*  BUN 24* 13 12  CREATININE 0.76 0.92 0.83  CALCIUM 8.7* 8.8* 8.4*   GFR: Estimated Creatinine Clearance: 41.4 mL/min (by C-G formula based on SCr of 0.83 mg/dL). Liver Function Tests: Recent Labs  Lab 03/29/23 1746 03/30/23 1618 03/31/23 0458  AST 434* 138* 89*  ALT 162* 109* 87*  ALKPHOS 997* 747* 595*  BILITOT 2.7* 1.6* 1.4*  PROT 6.4* 5.1* 5.1*  ALBUMIN 3.3* 2.7* 2.5*   No results for input(s): "LIPASE", "AMYLASE" in the last 168 hours. No results for input(s): "AMMONIA" in the last 168 hours. Coagulation Profile: Recent Labs  Lab  03/29/23 1746  INR 1.1   Cardiac Enzymes: No results for input(s): "CKTOTAL", "CKMB", "CKMBINDEX", "TROPONINI" in the last 168 hours. BNP (last 3 results) No results for input(s): "PROBNP" in the last 8760 hours. HbA1C: No results for input(s): "HGBA1C" in the last 72 hours. CBG: No results for input(s): "GLUCAP" in the last 168 hours. Lipid Profile: No results for input(s): "CHOL", "HDL", "LDLCALC", "TRIG", "CHOLHDL", "LDLDIRECT" in the last 72 hours. Thyroid Function Tests: No results for input(s): "TSH", "T4TOTAL", "FREET4", "T3FREE", "THYROIDAB" in the last 72 hours. Anemia Panel: No results for input(s): "VITAMINB12", "FOLATE", "FERRITIN", "TIBC", "IRON", "RETICCTPCT" in the last 72 hours. Sepsis Labs: Recent Labs  Lab 03/29/23 1746 03/30/23 0157  LATICACIDVEN 2.1* 1.2    Recent Results (from the past 240 hour(s))  Resp panel by RT-PCR (RSV, Flu A&B, Covid) Anterior Nasal Swab     Status: None   Collection Time: 03/29/23  5:45 PM   Specimen: Anterior Nasal Swab  Result Value Ref Range Status   SARS Coronavirus 2 by RT PCR NEGATIVE NEGATIVE Final    Comment: (NOTE) SARS-CoV-2 target nucleic acids are NOT DETECTED.  The SARS-CoV-2 RNA is generally detectable in upper respiratory specimens during the acute phase of infection. The lowest concentration of SARS-CoV-2 viral copies this assay can detect is 138 copies/mL. A negative result does not preclude SARS-Cov-2 infection and should not be used as the sole basis for treatment or other patient management decisions. A negative result may occur with  improper specimen collection/handling, submission of specimen other than nasopharyngeal swab, presence of viral mutation(s) within the areas targeted by this assay, and inadequate number of viral copies(<138 copies/mL). A negative result must be combined with clinical observations, patient history, and epidemiological information. The expected result is Negative.  Fact  Sheet for Patients:  BloggerCourse.com  Fact Sheet for Healthcare Providers:  SeriousBroker.it  This test is no t yet approved or cleared by the [place] FDA and  has been authorized for detection and/or diagnosis of SARS-CoV-2 by FDA under an Emergency Use Authorization (EUA). This EUA will remain  in effect (meaning this test can be used) for the duration of the COVID-19 declaration under Section 564(b)(1) of the Act, 21 U.S.C.section 360bbb-3(b)(1), unless the authorization is terminated  or revoked sooner.       Influenza A by PCR NEGATIVE NEGATIVE Final   Influenza B by PCR NEGATIVE NEGATIVE Final    Comment: (NOTE) The Xpert Xpress SARS-CoV-2/FLU/RSV plus assay is intended as an aid in the diagnosis of influenza from Nasopharyngeal swab specimens and should not be used as a sole  basis for treatment. Nasal washings and aspirates are unacceptable for Xpert Xpress SARS-CoV-2/FLU/RSV testing.  Fact Sheet for Patients: BloggerCourse.com  Fact Sheet for Healthcare Providers: SeriousBroker.it  This test is not yet approved or cleared by the [place] FDA and has been authorized for detection and/or diagnosis of SARS-CoV-2 by FDA under an Emergency Use Authorization (EUA). This EUA will remain in effect (meaning this test can be used) for the duration of the COVID-19 declaration under Section 564(b)(1) of the Act, 21 U.S.C. section 360bbb-3(b)(1), unless the authorization is terminated or revoked.     Resp Syncytial Virus by PCR NEGATIVE NEGATIVE Final    Comment: (NOTE) Fact Sheet for Patients: BloggerCourse.com  Fact Sheet for Healthcare Providers: SeriousBroker.it  This test is not yet approved or cleared by the [place] FDA and has been authorized for detection and/or diagnosis of SARS-CoV-2 by FDA under an  Emergency Use Authorization (EUA). This EUA will remain in effect (meaning this test can be used) for the duration of the COVID-19 declaration under Section 564(b)(1) of the Act, 21 U.S.C. section 360bbb-3(b)(1), unless the authorization is terminated or revoked.  Performed at [facility], [address] [place], [place] [address]   Culture, blood (Routine x 2)     Status: Abnormal (Preliminary result)   Collection Time: 03/29/23  5:46 PM   Specimen: BLOOD  Result Value Ref Range Status   Specimen Description   Final    BLOOD RIGHT ANTECUBITAL Performed at [facility], [address] [place], [place] [address]    Special Requests   Final    BOTTLES DRAWN AEROBIC AND ANAEROBIC Blood Culture adequate volume Performed at [facility], [address] [place], [place] [address]    Culture  Setup Time   Final    GRAM NEGATIVE RODS IN BOTH AEROBIC AND ANAEROBIC BOTTLES CRITICAL RESULT CALLED TO, READ BACK BY AND VERIFIED WITH: JASON ROBBINS AT 0630 03/30/23.PMF    Culture (A)  Final    ESCHERICHIA COLI SUSCEPTIBILITIES TO FOLLOW Performed at [facility], [address], [place], [place] [address]    Report Status PENDING  Incomplete  Blood Culture ID Panel (Reflexed)     Status: Abnormal   Collection Time: 03/29/23  5:46 PM  Result Value Ref Range Status   Enterococcus faecalis NOT DETECTED NOT DETECTED Final   Enterococcus Faecium NOT DETECTED NOT DETECTED Final   Listeria monocytogenes NOT DETECTED NOT DETECTED Final   Staphylococcus species NOT DETECTED NOT DETECTED Final   Staphylococcus aureus (BCID) NOT DETECTED NOT DETECTED Final   Staphylococcus epidermidis NOT DETECTED NOT DETECTED Final   Staphylococcus lugdunensis NOT DETECTED NOT DETECTED Final   Streptococcus species NOT DETECTED NOT DETECTED Final   Streptococcus agalactiae NOT DETECTED NOT DETECTED Final   Streptococcus pneumoniae NOT DETECTED NOT DETECTED Final    Streptococcus pyogenes NOT DETECTED NOT DETECTED Final   A.calcoaceticus-baumannii NOT DETECTED NOT DETECTED Final   Bacteroides fragilis NOT DETECTED NOT DETECTED Final   Enterobacterales DETECTED (A) NOT DETECTED Final    Comment: Enterobacterales represent a large order of gram negative bacteria, not a single organism. CRITICAL RESULT CALLED TO, READ BACK BY AND VERIFIED WITH: JASON ROBBINS AT 0630 03/30/23.PMF    Enterobacter cloacae complex NOT DETECTED NOT DETECTED Final   Escherichia coli DETECTED (A) NOT DETECTED Final    Comment: CRITICAL RESULT CALLED TO, READ BACK BY AND VERIFIED WITH: JASON ROBBINS AT 0630 03/30/23.PMF    Klebsiella aerogenes NOT DETECTED NOT  DETECTED Final   Klebsiella oxytoca NOT DETECTED NOT DETECTED Final   Klebsiella pneumoniae NOT DETECTED NOT DETECTED Final   Proteus species NOT DETECTED NOT DETECTED Final   Salmonella species NOT DETECTED NOT DETECTED Final   Serratia marcescens NOT DETECTED NOT DETECTED Final   Haemophilus influenzae NOT DETECTED NOT DETECTED Final   Neisseria meningitidis NOT DETECTED NOT DETECTED Final   Pseudomonas aeruginosa NOT DETECTED NOT DETECTED Final   Stenotrophomonas maltophilia NOT DETECTED NOT DETECTED Final   Candida albicans NOT DETECTED NOT DETECTED Final   Candida auris NOT DETECTED NOT DETECTED Final   Candida glabrata NOT DETECTED NOT DETECTED Final   Candida krusei NOT DETECTED NOT DETECTED Final   Candida parapsilosis NOT DETECTED NOT DETECTED Final   Candida tropicalis NOT DETECTED NOT DETECTED Final   Cryptococcus neoformans/gattii NOT DETECTED NOT DETECTED Final   CTX-M ESBL NOT DETECTED NOT DETECTED Final   Carbapenem resistance IMP NOT DETECTED NOT DETECTED Final   Carbapenem resistance KPC NOT DETECTED NOT DETECTED Final   Carbapenem resistance NDM NOT DETECTED NOT DETECTED Final   Carbapenem resist OXA 48 LIKE NOT DETECTED NOT DETECTED Final   Carbapenem resistance VIM NOT DETECTED NOT DETECTED  Final    Comment: Performed at [facility], [address] [place], [place] [address]  Culture, blood (Routine x 2)     Status: None (Preliminary result)   Collection Time: 03/29/23  6:45 PM   Specimen: BLOOD  Result Value Ref Range Status   Specimen Description   Final    BLOOD BLOOD LEFT ARM Performed at [facility], [address], [place], [place] [address]    Special Requests   Final    BOTTLES DRAWN AEROBIC AND ANAEROBIC Blood Culture adequate volume Performed at [facility], [address] [place], [place] [address]    Culture  Setup Time   Final    GRAM NEGATIVE RODS IN BOTH AEROBIC AND ANAEROBIC BOTTLES CRITICAL RESULT CALLED TO, READ BACK BY AND VERIFIED WITH: JASON ROBBINS AT 0630 03/30/23.PMF    Culture   Final    GRAM NEGATIVE RODS IDENTIFICATION TO FOLLOW Performed at [facility], [address], [place], [place] [address]    Report Status PENDING  Incomplete  Urine Culture     Status: Abnormal (Preliminary result)   Collection Time: 03/29/23  6:45 PM   Specimen: Urine, Random  Result Value Ref Range Status   Specimen Description   Final    URINE, RANDOM Performed at [facility], [address] [place], [place] [address]    Special Requests   Final    NONE Reflexed from [id number] Performed at [facility], [address] [place], [place] [address]    Culture (A)  Final    >=100,000 COLONIES/mL ENTEROCOCCUS FAECIUM SUSCEPTIBILITIES TO FOLLOW Performed at [facility], [address] [place], [place] [address]    Report Status PENDING  Incomplete  Culture, blood (Routine X 2) w Reflex to ID Panel     Status: None (Preliminary result)   Collection Time: 03/30/23 10:49 PM   Specimen: BLOOD  Result Value Ref Range Status   Specimen Description BLOOD SITE NOT SPECIFIED  Final   Special Requests AEROBIC BOTTLE ONLY Blood Culture adequate volume  Final   Culture   Final    NO GROWTH < 12  HOURS Performed at [facility], [address] [place], [place] [address]    Report Status PENDING  Incomplete  Culture, blood (Routine X 2) w Reflex to ID Panel     Status: None (Preliminary result)   Collection Time: 03/30/23 10:49 PM   Specimen: BLOOD  Result Value Ref Range Status   Specimen Description BLOOD BLOOD RIGHT HAND  Final   Special Requests AEROBIC BOTTLE ONLY Blood Culture adequate volume  Final   Culture   Final    NO GROWTH < 12 HOURS Performed at [facility], [address], [place], [place] [address]    Report Status PENDING  Incomplete     Radiology Studies: MR ABDOMEN MRCP W WO CONTAST  Result Date: 03/30/2023 CLINICAL DATA:  82 year old female history of cholelithiasis and possible tumefactive sludge in the gallbladder lumen. EXAM: MRI ABDOMEN WITHOUT AND WITH CONTRAST (INCLUDING MRCP) TECHNIQUE: Multiplanar multisequence MR imaging of the abdomen was performed both before and after the administration of intravenous contrast. Heavily T2-weighted images of the biliary and pancreatic ducts were obtained, and three-dimensional MRCP images were rendered by post processing. CONTRAST:  6mL GADAVIST GADOBUTROL 1 MMOL/ML IV SOLN COMPARISON:  No prior abdominal MRI. Abdominal ultrasound 03/29/2023. FINDINGS: Lower chest: Unremarkable. Hepatobiliary: Liver contour is mildly irregular, suggesting a background of cirrhosis. No suspicious cystic or solid hepatic lesions. Mild intrahepatic biliary ductal dilatation. Common bile duct is also dilated measuring up to 10 mm in the porta hepatis proximally. Several filling defects are noted in the distal common bile duct, measuring up to 9 mm. Cystic duct is not well visualized, but small filling defects in the cystic duct suggest probable stones in this cystic duct. Small filling defects are also noted lying dependently in the gallbladder, indicative of cholelithiasis. Gallbladder is moderately distended. Gallbladder wall  appears mildly thickened and edematous. Pancreas: No pancreatic mass. No pancreatic ductal dilatation. No pancreatic or peripancreatic fluid collections or inflammatory changes. Spleen: Spleen is enlarged measuring 12.9 x 9.4 x 16.7 cm (estimated splenic volume of 1,013 mL). Adrenals/Urinary Tract: Several subcentimeter T1 hypointense, T2 hyperintense, nonenhancing lesions in both kidneys are compatible with tiny simple cysts (Bosniak class 1, no imaging follow-up recommended). No aggressive appearing renal lesions are noted. No hydroureteronephrosis in the visualized portions of the abdomen. Bilateral adrenal glands are normal in appearance. Stomach/Bowel: Visualized portions are unremarkable. Vascular/Lymphatic: No aneurysm identified in the visualized abdominal vasculature. Dilated portal vein (16 mm in diameter). No lymphadenopathy noted in the abdomen. Other: No significant volume of ascites noted in the visualized portions of the peritoneal cavity. Musculoskeletal: No aggressive appearing osseous lesions are noted in the visualized portions of the skeleton. IMPRESSION: 1. Study is positive for cholelithiasis and choledocholithiasis. Several obstructing stones are noted in the distal common bile duct and there is associated proximal intra and extrahepatic biliary ductal dilatation. There is also a moderately distended gallbladder with mild gallbladder wall thickening and edema and several stones which appear likely impacted in the region of the cystic duct. Findings are concerning for early acute cholecystitis. Surgical consultation is recommended. 2. Morphologic changes in the liver suggesting early cirrhosis. 3. 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 Electronically Signed   By: Trudie Reed M.D.   On: 03/30/2023 08:58   MR 3D Recon At Scanner  Result Date: 03/30/2023 CLINICAL DATA:  82 year old female history of cholelithiasis and possible  tumefactive sludge in the gallbladder lumen. EXAM: MRI ABDOMEN WITHOUT AND WITH CONTRAST (INCLUDING MRCP) TECHNIQUE: Multiplanar multisequence MR imaging of the abdomen was performed both before and after the administration of intravenous  contrast. Heavily T2-weighted images of the biliary and pancreatic ducts were obtained, and three-dimensional MRCP images were rendered by post processing. CONTRAST:  6mL GADAVIST GADOBUTROL 1 MMOL/ML IV SOLN COMPARISON:  No prior abdominal MRI. Abdominal ultrasound 03/29/2023. FINDINGS: Lower chest: Unremarkable. Hepatobiliary: Liver contour is mildly irregular, suggesting a background of cirrhosis. No suspicious cystic or solid hepatic lesions. Mild intrahepatic biliary ductal dilatation. Common bile duct is also dilated measuring up to 10 mm in the porta hepatis proximally. Several filling defects are noted in the distal common bile duct, measuring up to 9 mm. Cystic duct is not well visualized, but small filling defects in the cystic duct suggest probable stones in this cystic duct. Small filling defects are also noted lying dependently in the gallbladder, indicative of cholelithiasis. Gallbladder is moderately distended. Gallbladder wall appears mildly thickened and edematous. Pancreas: No pancreatic mass. No pancreatic ductal dilatation. No pancreatic or peripancreatic fluid collections or inflammatory changes. Spleen: Spleen is enlarged measuring 12.9 x 9.4 x 16.7 cm (estimated splenic volume of 1,013 mL). Adrenals/Urinary Tract: Several subcentimeter T1 hypointense, T2 hyperintense, nonenhancing lesions in both kidneys are compatible with tiny simple cysts (Bosniak class 1, no imaging follow-up recommended). No aggressive appearing renal lesions are noted. No hydroureteronephrosis in the visualized portions of the abdomen. Bilateral adrenal glands are normal in appearance. Stomach/Bowel: Visualized portions are unremarkable. Vascular/Lymphatic: No aneurysm identified in the  visualized abdominal vasculature. Dilated portal vein (16 mm in diameter). No lymphadenopathy noted in the abdomen. Other: No significant volume of ascites noted in the visualized portions of the peritoneal cavity. Musculoskeletal: No aggressive appearing osseous lesions are noted in the visualized portions of the skeleton. IMPRESSION: 1. Study is positive for cholelithiasis and choledocholithiasis. Several obstructing stones are noted in the distal common bile duct and there is associated proximal intra and extrahepatic biliary ductal dilatation. There is also a moderately distended gallbladder with mild gallbladder wall thickening and edema and several stones which appear likely impacted in the region of the cystic duct. Findings are concerning for early acute cholecystitis. Surgical consultation is recommended. 2. Morphologic changes in the liver suggesting early cirrhosis. 3. 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 Electronically Signed   By: Trudie Reed M.D.   On: 03/30/2023 08:58   US ABDOMEN LIMITED RUQ (LIVER/GB)  Result Date: 03/29/2023 CLINICAL DATA:  Fever and transaminitis. EXAM: ULTRASOUND ABDOMEN LIMITED RIGHT UPPER QUADRANT COMPARISON:  September 04, 2021 FINDINGS: Gallbladder: Echogenic gallstones are seen within the gallbladder lumen. The largest gallstone measures approximately 8.7 mm. A large amount of lobulated, heterogeneous echogenic material is also seen along the nondependent wall of the gallbladder lumen no abnormal flow is seen within this region on color Doppler evaluation. The gallbladder wall measures 3.9 mm in thickness. No sonographic Murphy sign noted by sonographer. Common bile duct: Diameter: 11.2 mm Liver: Mild intrahepatic biliary dilatation is noted. No focal lesion identified. There is diffusely increased echogenicity of the liver parenchyma. Portal vein is patent on color Doppler imaging with normal direction of  blood flow towards the liver. Other: Limited study secondary to inability of the patient to hold her breath. IMPRESSION: 1. Cholelithiasis with additional findings that may represent a large amount of tumefactive sludge within the gallbladder lumen. Correlation with nonemergent MRI is recommended to further exclude the presence of an underlying neoplastic process. 2. Dilated common bile duct with intrahepatic biliary dilatation. MRCP is recommended to exclude presence of an obstructing lesion. 3. Hepatic steatosis without focal liver  lesions. Electronically Signed   By: Aram Candela M.D.   On: 03/29/2023 23:24   CT Head Wo Contrast  Result Date: 03/29/2023 CLINICAL DATA:  Mental status change, unknown cause EXAM: CT HEAD WITHOUT CONTRAST TECHNIQUE: Contiguous axial images were obtained from the base of the skull through the vertex without intravenous contrast. RADIATION DOSE REDUCTION: This exam was performed according to the departmental dose-optimization program which includes automated exposure control, adjustment of the mA and/or kV according to patient size and/or use of iterative reconstruction technique. COMPARISON:  03/20/2023 FINDINGS: Brain: There is atrophy and chronic small vessel disease changes. No acute intracranial abnormality. Specifically, no hemorrhage, hydrocephalus, mass lesion, acute infarction, or significant intracranial injury. Vascular: No hyperdense vessel or unexpected calcification. Skull: No acute calvarial abnormality. Sinuses/Orbits: No acute findings Other: None IMPRESSION: Atrophy, chronic microvascular disease. No acute intracranial abnormality. Electronically Signed   By: Charlett Nose M.D.   On: 03/29/2023 20:15   DG Chest Port 1 View  Result Date: 03/29/2023 CLINICAL DATA:  Sepsis EXAM: PORTABLE CHEST 1 VIEW COMPARISON:  03/20/2023 FINDINGS: Heart and mediastinal contours within normal limits. Rounded density in the right hilum likely vascular. Lungs clear. No  effusions or acute bony abnormality. IMPRESSION: No active disease. Rounded density in the right hilum felt to reflect right hilar vasculature. This could be confirmed with chest CT with IV contrast. Electronically Signed   By: Charlett Nose M.D.   On: 03/29/2023 20:14    Scheduled Meds:  atorvastatin  10 mg Oral Daily   diclofenac  100 mg Rectal Once   feeding supplement  1 Container Oral TID BM   levothyroxine  75 mcg Oral QAC breakfast   pantoprazole  40 mg Oral Daily   sodium chloride flush  3 mL Intravenous Q12H   Continuous Infusions:  cefTRIAXone (ROCEPHIN)  IV Stopped (03/30/23 1741)     LOS: 1 day   Hughie Closs, MD [facility]  03/31/2023, 12:30 PM   *Please note that this is a verbal dictation therefore any spelling or grammatical errors are due to the "Dragon [employer] One" system interpretation.  Please page via Amion and do not message via secure chat for urgent patient care matters. Secure chat can be used for non urgent patient care matters.  How to contact the [facility] Attending or Consulting provider 7A - 7P or covering provider during after hours 7P -7A, for this patient?  Check the care team in [facility] and look for a) attending/consulting [facility] provider listed and b) the [facility] team listed. Page or secure chat 7A-7P. Log into [email or web address] and use [place]'s universal password to access. If you do not have the password, please contact the hospital operator. Locate the [facility] provider you are looking for under [facility] and page to a number that you can be directly reached. If you still have difficulty reaching the provider, please page the [facility] (Director on Call) for the Hospitalists listed on amion for assistance.

## 2023-03-31 NOTE — Transfer of Care (Signed)
Immediate Anesthesia Transfer of Care Note  Patient: MAIYAH GOYNE  Procedure(s) Performed: ENDOSCOPIC RETROGRADE CHOLANGIOPANCREATOGRAPHY (ERCP) SPHINCTEROTOMY REMOVAL OF STONES and SLUDGE  Patient Location: PACU  Anesthesia Type:General  Level of Consciousness: awake  Airway & Oxygen Therapy: Patient Spontanous Breathing  Post-op Assessment: Report given to RN and Post -op Vital signs reviewed and stable  Post vital signs: Reviewed and stable  Last Vitals:  Vitals Value Taken Time  BP    Temp    Pulse    Resp    SpO2      Last Pain:  Vitals:   03/31/23 1005  TempSrc: Temporal  PainSc: 0-No pain         Complications: No notable events documented.

## 2023-03-31 NOTE — Op Note (Signed)
[facility] Patient Name: Angel Cameron Procedure Date : 03/31/2023 MRN: [id number] Attending MD: Meryl Dare , MD, [phone] Date of Birth: [birth year] CSN: [id number] Age: 82 Admit Type: Inpatient Procedure:                ERCP Indications:              Bile duct stone(s), Abdominal pain of suspected                            biliary origin, Abnormal MRCP, Elevated liver                            enzymes, E. coli bacteremia - R/O cholangitis Providers:                Venita Lick. Russella Dar, MD, Eliberto Ivory, RN, Harrington Challenger,                            Technician Referring MD:             Layla Maw. Ward, DO Medicines:                General Anesthesia Complications:            No immediate complications. Estimated Blood Loss:     Estimated blood loss: none. Procedure:                Pre-Anesthesia Assessment:                           - Prior to the procedure, a History and Physical                            was performed, and patient medications and                            allergies were reviewed. The patient's tolerance of                            previous anesthesia was also reviewed. The risks                            and benefits of the procedure and the sedation                            options and risks were discussed with the patient.                            All questions were answered, and informed consent                            was obtained. Prior Anticoagulants: The patient has                            taken no anticoagulant or antiplatelet agents. ASA  Grade Assessment: III - A patient with severe                            systemic disease. After reviewing the risks and                            benefits, the patient was deemed in satisfactory                            condition to undergo the procedure.                           After obtaining informed consent, the scope was                             passed under direct vision. Throughout the                            procedure, the patient's blood pressure, pulse, and                            oxygen saturations were monitored continuously. The                            [id number]-Q190V ([id number]) Olympus duodenoscope was                            introduced through the mouth, and used to inject                            contrast into and used to inject contrast into the                            bile duct. The ERCP was accomplished without                            difficulty. The patient tolerated the procedure                            well. Scope In: Scope Out: Findings:      The scout film was normal. EGD performed prior to ERCP showed a small       hiatal hernia, otherwise normal. The duodenoscope was advanced to a       normal major papilla located on the inside edge of a diverticulum in the       descending duodenum. Examination of the pharynx, larynx and associated       structures, and upper GI tract was otherwise normal. A straight       Roadrunner wire was passed into the biliary tree. The short-nosed       traction sphincterotome was passed over the guidewire and the bile duct       was then deeply cannulated. Contrast was injected. I personally       interpreted the bile duct images. There was appropriate flow of contrast  through the ducts. The common bile duct contained multiple stones, the       largest of which was 8 mm in diameter. The common bile duct was       moderately dilated and diffusely dilated, with stones causing an       obstruction. The largest diameter was 14 mm. The biliary tree was       otherwise normal. The cystic duct and gallbladder did not fill. An 8 mm       biliary sphincterotomy was made with a traction (standard)       sphincterotome using ERBE electrocautery. There was no       post-sphincterotomy bleeding. The biliary tree was swept 3 times with a       12 mm balloon starting at the  bifurcation. All stones were removed.       Sludge was removed. The PD was not entered by intention. Excellent       biliary drainage following stone removal. Impression:               - The common bile duct was moderately dilated, with                            stones causing an obstruction.                           - Choledocholithiasis and sludge was found.                            Complete removal was accomplished by biliary                            sphincterotomy and balloon extraction.                           - A biliary sphincterotomy was performed.                           - The biliary tree was swept.                           - Periampullary diverticulum.                           - Small hiatal hernia. Recommendation:           - Return patient to hospital ward for ongoing care.                           - Observe patient's clinical course following                            today's ERCP with therapeutic intervention.                           - Avoid aspirin and nonsteroidal anti-inflammatory                            medicines for 1 week.                           -  Continue IV Rocephin.                           - Surgical consult to consider cholecystectomy. Procedure Code(s):        --- Professional ---                           [id number], Endoscopic retrograde                            cholangiopancreatography (ERCP); with removal of                            calculi/debris from biliary/pancreatic duct(s)                           43262, Endoscopic retrograde                            cholangiopancreatography (ERCP); with                            sphincterotomy/papillotomy                           [phone], Endoscopic catheterization of the biliary                            ductal system, radiological supervision and                            interpretation Diagnosis Code(s):        --- Professional ---                           K80.51, Calculus of bile duct  without cholangitis                            or cholecystitis with obstruction                           R10.9, Unspecified abdominal pain                           R74.8, Abnormal levels of other serum enzymes CPT copyright 2022 [employer]. All rights reserved. The codes documented in this report are preliminary and upon coder review may  be revised to meet current compliance requirements. Meryl Dare, MD 03/31/2023 11:20:23 AM This report has been signed electronically. Number of Addenda: 0

## 2023-03-31 NOTE — Consult Note (Signed)
MARDI EVAVOLD 1940/08/13  [id number].    Requesting MD: Hughie Closs Chief Complaint/Reason for Consult: choledocholithiasis  HPI:  82 yo female presented with altered mental status and was found to have obstructing choledocholithiasis and underwent ERCP with removal of stones.  She lives a memory facility and is wheelchair bound.  ROS: Review of Systems  Unable to perform ROS: Dementia    Family History  Problem Relation Age of Onset   Diabetes Mother    Dementia Mother    Heart failure Father     Past Medical History:  Diagnosis Date   Hypertension    Memory loss     Past Surgical History:  Procedure Laterality Date   COLONOSCOPY     HERNIA REPAIR     HIP ARTHROPLASTY Right 06/28/2022   Procedure: ARTHROPLASTY BIPOLAR HIP (HEMIARTHROPLASTY);  Surgeon: Juanell Fairly, MD;  Location: [facility] ORS;  Service: Orthopedics;  Laterality: Right;    Social History:  reports that she has never smoked. She has never used smokeless tobacco. She reports that she does not currently use alcohol. She reports that she does not use drugs.  Allergies: No Known Allergies  Medications Prior to Admission  Medication Sig Dispense Refill   acetaminophen (TYLENOL) 500 MG tablet Take 1,000 mg by mouth in the morning, at noon, and at bedtime.     atorvastatin (LIPITOR) 10 MG tablet Take 10 mg by mouth daily.     benazepril (LOTENSIN) 20 MG tablet Take 20 mg by mouth daily.     docusate sodium (COLACE) 100 MG capsule Take 1 capsule (100 mg total) by mouth 2 (two) times daily. 10 capsule 0   donepezil (ARICEPT) 5 MG tablet Take 5 mg by mouth at bedtime.     Elastic Bandages & Supports (T.E.D. KNEE LENGTH/M-REGULAR) MISC      FLUoxetine (PROZAC) 10 MG capsule Take 10 mg by mouth daily.     levothyroxine (SYNTHROID, LEVOTHROID) 75 MCG tablet Take 75 mcg by mouth daily before breakfast.     memantine (NAMENDA) 5 MG tablet Take 5 mg by mouth daily.     metoprolol succinate (TOPROL-XL) 50  MG 24 hr tablet Take 50 mg by mouth daily.     omeprazole (PRILOSEC) 20 MG capsule Take 20 mg by mouth daily as needed (for heartburn).     traMADol (ULTRAM) 50 MG tablet Take 50 mg by mouth in the morning, at noon, and at bedtime.     traZODone (DESYREL) 50 MG tablet Take 1 tablet (50 mg total) by mouth at bedtime as needed for sleep. 20 tablet 0   bisacodyl (DULCOLAX) 10 MG suppository Place 1 suppository (10 mg total) rectally daily as needed for moderate constipation. (Patient not taking: Reported on 03/30/2023) 12 suppository 0   enoxaparin (LOVENOX) 40 MG/0.4ML injection Inject 0.4 mLs (40 mg total) into the skin daily for 21 days. 8.4 mL 0   HYDROcodone-acetaminophen (NORCO/VICODIN) 5-325 MG tablet Take 1-2 tablets by mouth every 4 (four) hours as needed for moderate pain (pain score 4-6). (Patient not taking: Reported on 03/30/2023) 20 tablet 0   meclizine (ANTIVERT) 25 MG tablet Take 0.5 tablets (12.5 mg total) by mouth 2 (two) times daily as needed for dizziness. (Patient not taking: Reported on 03/30/2023) 20 tablet 0   Nutritional Supplements (,FEEDING SUPPLEMENT, PROSOURCE PLUS) liquid Take 30 mLs by mouth 2 (two) times daily between meals. (Patient not taking: Reported on 03/30/2023) 300 mL 0    Physical Exam: Blood pressure  116/83, pulse (!) 109, temperature 98.1 F (36.7 C), temperature source Oral, resp. rate 16, weight 52 kg, SpO2 93%. Gen: somnolent Resp: RRR CV: tachycardic Abd: soft, NT Neuro: unable to asses  Results for orders placed or performed during the hospital encounter of 03/30/23 (from the past 48 hour(s))  Comprehensive metabolic panel     Status: Abnormal   Collection Time: 03/30/23  4:18 PM  Result Value Ref Range   Sodium 140 135 - 145 mmol/L   Potassium 3.4 (L) 3.5 - 5.1 mmol/L   Chloride 100 98 - 111 mmol/L   CO2 30 22 - 32 mmol/L   Glucose, Bld 83 70 - 99 mg/dL    Comment: Glucose reference range applies only to samples taken after fasting for at  least 8 hours.   BUN 13 8 - 23 mg/dL   Creatinine, Ser 4.09 0.44 - 1.00 mg/dL   Calcium 8.8 (L) 8.9 - 10.3 mg/dL   Total Protein 5.1 (L) 6.5 - 8.1 g/dL   Albumin 2.7 (L) 3.5 - 5.0 g/dL   AST 811 (H) 15 - 41 U/L   ALT 109 (H) 0 - 44 U/L   Alkaline Phosphatase 747 (H) 38 - 126 U/L   Total Bilirubin 1.6 (H) 0.3 - 1.2 mg/dL   GFR, Estimated >91 >47 mL/min    Comment: (NOTE) Calculated using the CKD-EPI Creatinine Equation (2021)    Anion gap 10 5 - 15    Comment: Performed at [facility], [address], [place], [place] [address]  CBC     Status: Abnormal   Collection Time: 03/30/23  4:18 PM  Result Value Ref Range   WBC 4.8 4.0 - 10.5 K/uL   RBC 3.17 (L) 3.87 - 5.11 MIL/uL   Hemoglobin 8.8 (L) 12.0 - 15.0 g/dL   HCT 21.3 (L) 08.6 - 57.8 %   MCV 86.8 80.0 - 100.0 fL   MCH 27.8 26.0 - 34.0 pg   MCHC 32.0 30.0 - 36.0 g/dL   RDW 46.9 62.9 - 52.8 %   Platelets 175 150 - 400 K/uL   nRBC 0.0 0.0 - 0.2 %    Comment: Performed at [facility], [address] [place], [place] [address]  Type and screen [facility]     Status: None   Collection Time: 03/30/23 10:44 PM  Result Value Ref Range   ABO/RH(D) AB POS    Antibody Screen NEG    Sample Expiration      04/02/2023,2359 Performed at [facility], [address], [place], [place] [address]   Culture, blood (Routine X 2) w Reflex to ID Panel     Status: None (Preliminary result)   Collection Time: 03/30/23 10:49 PM   Specimen: BLOOD  Result Value Ref Range   Specimen Description BLOOD SITE NOT SPECIFIED    Special Requests AEROBIC BOTTLE ONLY Blood Culture adequate volume    Culture      NO GROWTH < 12 HOURS Performed at [facility], [address] [place], [place] [address]    Report Status PENDING   Culture, blood (Routine X 2) w Reflex to ID Panel     Status: None (Preliminary result)   Collection Time: 03/30/23 10:49 PM   Specimen: BLOOD  Result Value Ref Range   Specimen  Description BLOOD BLOOD RIGHT HAND    Special Requests AEROBIC BOTTLE ONLY Blood Culture adequate volume    Culture  NO GROWTH < 12 HOURS Performed at [facility], [address], [place], [place] [address]    Report Status PENDING   Comprehensive metabolic panel     Status: Abnormal   Collection Time: 03/31/23  4:58 AM  Result Value Ref Range   Sodium 139 135 - 145 mmol/L   Potassium 3.6 3.5 - 5.1 mmol/L   Chloride 101 98 - 111 mmol/L   CO2 26 22 - 32 mmol/L   Glucose, Bld 63 (L) 70 - 99 mg/dL    Comment: Glucose reference range applies only to samples taken after fasting for at least 8 hours.   BUN 12 8 - 23 mg/dL   Creatinine, Ser 5.95 0.44 - 1.00 mg/dL   Calcium 8.4 (L) 8.9 - 10.3 mg/dL   Total Protein 5.1 (L) 6.5 - 8.1 g/dL   Albumin 2.5 (L) 3.5 - 5.0 g/dL   AST 89 (H) 15 - 41 U/L   ALT 87 (H) 0 - 44 U/L   Alkaline Phosphatase 595 (H) 38 - 126 U/L   Total Bilirubin 1.4 (H) 0.3 - 1.2 mg/dL   GFR, Estimated >63 >87 mL/min    Comment: (NOTE) Calculated using the CKD-EPI Creatinine Equation (2021)    Anion gap 12 5 - 15    Comment: Performed at [facility], [address], [place], [place] [address]  CBC     Status: Abnormal   Collection Time: 03/31/23  4:58 AM  Result Value Ref Range   WBC 4.6 4.0 - 10.5 K/uL   RBC 3.16 (L) 3.87 - 5.11 MIL/uL   Hemoglobin 8.7 (L) 12.0 - 15.0 g/dL   HCT 29.5 (L) 18.8 - 41.6 %   MCV 87.0 80.0 - 100.0 fL   MCH 27.5 26.0 - 34.0 pg   MCHC 31.6 30.0 - 36.0 g/dL   RDW 60.6 30.1 - 60.1 %   Platelets 166 150 - 400 K/uL   nRBC 0.0 0.0 - 0.2 %    Comment: Performed at [facility], [address], [place], [place] [address]   DG ERCP  Result Date: 03/31/2023 CLINICAL DATA:  Choledocholithiasis EXAM: ERCP TECHNIQUE: Multiple spot images obtained with the fluoroscopic device and submitted for interpretation post-procedure. COMPARISON:  MRCP from previous day FINDINGS: A series of fluoroscopic spot images document  endoscopic cannulation and opacification of the CBD with balloon catheter passage through the CBD. The intrahepatic ducts are incompletely visualized appearing relatively decompressed centrally. No extravasation. IMPRESSION: Endoscopic CBD cannulation and intervention. Electronically Signed   By: Corlis Leak M.D.   On: 03/31/2023 15:04   MR ABDOMEN MRCP W WO CONTAST  Result Date: 03/30/2023 CLINICAL DATA:  82 year old female history of cholelithiasis and possible tumefactive sludge in the gallbladder lumen. EXAM: MRI ABDOMEN WITHOUT AND WITH CONTRAST (INCLUDING MRCP) TECHNIQUE: Multiplanar multisequence MR imaging of the abdomen was performed both before and after the administration of intravenous contrast. Heavily T2-weighted images of the biliary and pancreatic ducts were obtained, and three-dimensional MRCP images were rendered by post processing. CONTRAST:  6mL GADAVIST GADOBUTROL 1 MMOL/ML IV SOLN COMPARISON:  No prior abdominal MRI. Abdominal ultrasound 03/29/2023. FINDINGS: Lower chest: Unremarkable. Hepatobiliary: Liver contour is mildly irregular, suggesting a background of cirrhosis. No suspicious cystic or solid hepatic lesions. Mild intrahepatic biliary ductal dilatation. Common bile duct is also dilated measuring up to 10 mm in the porta hepatis proximally. Several filling defects are noted in the distal common bile duct, measuring up to 9 mm.  Cystic duct is not well visualized, but small filling defects in the cystic duct suggest probable stones in this cystic duct. Small filling defects are also noted lying dependently in the gallbladder, indicative of cholelithiasis. Gallbladder is moderately distended. Gallbladder wall appears mildly thickened and edematous. Pancreas: No pancreatic mass. No pancreatic ductal dilatation. No pancreatic or peripancreatic fluid collections or inflammatory changes. Spleen: Spleen is enlarged measuring 12.9 x 9.4 x 16.7 cm (estimated splenic volume of 1,013 mL).  Adrenals/Urinary Tract: Several subcentimeter T1 hypointense, T2 hyperintense, nonenhancing lesions in both kidneys are compatible with tiny simple cysts (Bosniak class 1, no imaging follow-up recommended). No aggressive appearing renal lesions are noted. No hydroureteronephrosis in the visualized portions of the abdomen. Bilateral adrenal glands are normal in appearance. Stomach/Bowel: Visualized portions are unremarkable. Vascular/Lymphatic: No aneurysm identified in the visualized abdominal vasculature. Dilated portal vein (16 mm in diameter). No lymphadenopathy noted in the abdomen. Other: No significant volume of ascites noted in the visualized portions of the peritoneal cavity. Musculoskeletal: No aggressive appearing osseous lesions are noted in the visualized portions of the skeleton. IMPRESSION: 1. Study is positive for cholelithiasis and choledocholithiasis. Several obstructing stones are noted in the distal common bile duct and there is associated proximal intra and extrahepatic biliary ductal dilatation. There is also a moderately distended gallbladder with mild gallbladder wall thickening and edema and several stones which appear likely impacted in the region of the cystic duct. Findings are concerning for early acute cholecystitis. Surgical consultation is recommended. 2. Morphologic changes in the liver suggesting early cirrhosis. 3. 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 Electronically Signed   By: Trudie Reed M.D.   On: 03/30/2023 08:58   MR 3D Recon At Scanner  Result Date: 03/30/2023 CLINICAL DATA:  82 year old female history of cholelithiasis and possible tumefactive sludge in the gallbladder lumen. EXAM: MRI ABDOMEN WITHOUT AND WITH CONTRAST (INCLUDING MRCP) TECHNIQUE: Multiplanar multisequence MR imaging of the abdomen was performed both before and after the administration of intravenous contrast. Heavily T2-weighted images of  the biliary and pancreatic ducts were obtained, and three-dimensional MRCP images were rendered by post processing. CONTRAST:  6mL GADAVIST GADOBUTROL 1 MMOL/ML IV SOLN COMPARISON:  No prior abdominal MRI. Abdominal ultrasound 03/29/2023. FINDINGS: Lower chest: Unremarkable. Hepatobiliary: Liver contour is mildly irregular, suggesting a background of cirrhosis. No suspicious cystic or solid hepatic lesions. Mild intrahepatic biliary ductal dilatation. Common bile duct is also dilated measuring up to 10 mm in the porta hepatis proximally. Several filling defects are noted in the distal common bile duct, measuring up to 9 mm. Cystic duct is not well visualized, but small filling defects in the cystic duct suggest probable stones in this cystic duct. Small filling defects are also noted lying dependently in the gallbladder, indicative of cholelithiasis. Gallbladder is moderately distended. Gallbladder wall appears mildly thickened and edematous. Pancreas: No pancreatic mass. No pancreatic ductal dilatation. No pancreatic or peripancreatic fluid collections or inflammatory changes. Spleen: Spleen is enlarged measuring 12.9 x 9.4 x 16.7 cm (estimated splenic volume of 1,013 mL). Adrenals/Urinary Tract: Several subcentimeter T1 hypointense, T2 hyperintense, nonenhancing lesions in both kidneys are compatible with tiny simple cysts (Bosniak class 1, no imaging follow-up recommended). No aggressive appearing renal lesions are noted. No hydroureteronephrosis in the visualized portions of the abdomen. Bilateral adrenal glands are normal in appearance. Stomach/Bowel: Visualized portions are unremarkable. Vascular/Lymphatic: No aneurysm identified in the visualized abdominal vasculature. Dilated portal vein (16 mm in diameter). No lymphadenopathy noted in  the abdomen. Other: No significant volume of ascites noted in the visualized portions of the peritoneal cavity. Musculoskeletal: No aggressive appearing osseous lesions are  noted in the visualized portions of the skeleton. IMPRESSION: 1. Study is positive for cholelithiasis and choledocholithiasis. Several obstructing stones are noted in the distal common bile duct and there is associated proximal intra and extrahepatic biliary ductal dilatation. There is also a moderately distended gallbladder with mild gallbladder wall thickening and edema and several stones which appear likely impacted in the region of the cystic duct. Findings are concerning for early acute cholecystitis. Surgical consultation is recommended. 2. Morphologic changes in the liver suggesting early cirrhosis. 3. 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 Electronically Signed   By: Trudie Reed M.D.   On: 03/30/2023 08:58   US ABDOMEN LIMITED RUQ (LIVER/GB)  Result Date: 03/29/2023 CLINICAL DATA:  Fever and transaminitis. EXAM: ULTRASOUND ABDOMEN LIMITED RIGHT UPPER QUADRANT COMPARISON:  September 04, 2021 FINDINGS: Gallbladder: Echogenic gallstones are seen within the gallbladder lumen. The largest gallstone measures approximately 8.7 mm. A large amount of lobulated, heterogeneous echogenic material is also seen along the nondependent wall of the gallbladder lumen no abnormal flow is seen within this region on color Doppler evaluation. The gallbladder wall measures 3.9 mm in thickness. No sonographic Murphy sign noted by sonographer. Common bile duct: Diameter: 11.2 mm Liver: Mild intrahepatic biliary dilatation is noted. No focal lesion identified. There is diffusely increased echogenicity of the liver parenchyma. Portal vein is patent on color Doppler imaging with normal direction of blood flow towards the liver. Other: Limited study secondary to inability of the patient to hold her breath. IMPRESSION: 1. Cholelithiasis with additional findings that may represent a large amount of tumefactive sludge within the gallbladder lumen. Correlation with nonemergent  MRI is recommended to further exclude the presence of an underlying neoplastic process. 2. Dilated common bile duct with intrahepatic biliary dilatation. MRCP is recommended to exclude presence of an obstructing lesion. 3. Hepatic steatosis without focal liver lesions. Electronically Signed   By: Aram Candela M.D.   On: 03/29/2023 23:24   CT Head Wo Contrast  Result Date: 03/29/2023 CLINICAL DATA:  Mental status change, unknown cause EXAM: CT HEAD WITHOUT CONTRAST TECHNIQUE: Contiguous axial images were obtained from the base of the skull through the vertex without intravenous contrast. RADIATION DOSE REDUCTION: This exam was performed according to the departmental dose-optimization program which includes automated exposure control, adjustment of the mA and/or kV according to patient size and/or use of iterative reconstruction technique. COMPARISON:  03/20/2023 FINDINGS: Brain: There is atrophy and chronic small vessel disease changes. No acute intracranial abnormality. Specifically, no hemorrhage, hydrocephalus, mass lesion, acute infarction, or significant intracranial injury. Vascular: No hyperdense vessel or unexpected calcification. Skull: No acute calvarial abnormality. Sinuses/Orbits: No acute findings Other: None IMPRESSION: Atrophy, chronic microvascular disease. No acute intracranial abnormality. Electronically Signed   By: Charlett Nose M.D.   On: 03/29/2023 20:15   DG Chest Port 1 View  Result Date: 03/29/2023 CLINICAL DATA:  Sepsis EXAM: PORTABLE CHEST 1 VIEW COMPARISON:  03/20/2023 FINDINGS: Heart and mediastinal contours within normal limits. Rounded density in the right hilum likely vascular. Lungs clear. No effusions or acute bony abnormality. IMPRESSION: No active disease. Rounded density in the right hilum felt to reflect right hilar vasculature. This could be confirmed with chest CT with IV contrast. Electronically Signed   By: Charlett Nose M.D.   On: 03/29/2023 20:14  Assessment/Plan 81 yo female with choledocholithiasis. US showing multiple stones. MRCP with borderline wall thickening. No leukocytosis. Gallbladder not filling on ERCP.  I spoke with daughter Velna Hatchet about possible gallbladder removal. At this time surgery would mainly be preventative for future gallbladder issues. I do not think she has active cholecystitis. Velna Hatchet would prefer to hold off surgery unless it is necessary for current condition. I think this is reasonable. We will continue to follow along to ensure resolution and not a more complicated picture with cholecystitis  I reviewed last 24 h vitals and pain scores, last 48 h intake and output, last 24 h labs and trends, and last 24 h imaging results.  [facility] 03/31/2023, 4:34 PM Please see Amion for pager number during day hours 7:00am-4:30pm or 7:00am -11:30am on weekends

## 2023-03-31 NOTE — Plan of Care (Signed)
Problem: Coping: Goal: Level of anxiety will decrease Outcome: Progressing   Problem: Pain Managment: Goal: General experience of comfort will improve Outcome: Progressing   Problem: Safety: Goal: Ability to remain free from injury will improve Outcome: Progressing   Problem: Skin Integrity: Goal: Risk for impaired skin integrity will decrease Outcome: Progressing   Problem: Pain Management: Goal: Pain level will decrease Outcome: Progressing

## 2023-04-01 DIAGNOSIS — F0390 Unspecified dementia without behavioral disturbance: Secondary | ICD-10-CM | POA: Diagnosis not present

## 2023-04-01 DIAGNOSIS — K8033 Calculus of bile duct with acute cholangitis with obstruction: Secondary | ICD-10-CM | POA: Diagnosis not present

## 2023-04-01 DIAGNOSIS — K831 Obstruction of bile duct: Secondary | ICD-10-CM | POA: Diagnosis not present

## 2023-04-01 LAB — COMPREHENSIVE METABOLIC PANEL
ALT: 60 U/L — ABNORMAL HIGH (ref 0–44)
AST: 40 U/L (ref 15–41)
Albumin: 2.4 g/dL — ABNORMAL LOW (ref 3.5–5.0)
Alkaline Phosphatase: 543 U/L — ABNORMAL HIGH (ref 38–126)
Anion gap: 8 (ref 5–15)
BUN: 14 mg/dL (ref 8–23)
CO2: 24 mmol/L (ref 22–32)
Calcium: 8.1 mg/dL — ABNORMAL LOW (ref 8.9–10.3)
Chloride: 104 mmol/L (ref 98–111)
Creatinine, Ser: 0.84 mg/dL (ref 0.44–1.00)
GFR, Estimated: >60 mL/min (ref >60)
Glucose, Bld: 178 mg/dL — ABNORMAL HIGH (ref 70–99)
Potassium: 3.5 mmol/L (ref 3.5–5.1)
Sodium: 136 mmol/L (ref 135–145)
Total Bilirubin: 0.7 mg/dL (ref 0.3–1.2)
Total Protein: 4.8 g/dL — ABNORMAL LOW (ref 6.5–8.1)

## 2023-04-01 LAB — URINE CULTURE: Culture: >=100000 — AB

## 2023-04-01 LAB — CBC WITH DIFFERENTIAL/PLATELET
Abs Immature Granulocytes: 0.01 10*3/uL (ref 0.00–0.07)
Basophils Absolute: 0 10*3/uL (ref 0.0–0.1)
Basophils Relative: 0 %
Eosinophils Absolute: 0 10*3/uL (ref 0.0–0.5)
Eosinophils Relative: 0 %
HCT: 25.9 % — ABNORMAL LOW (ref 36.0–46.0)
Hemoglobin: 8.4 g/dL — ABNORMAL LOW (ref 12.0–15.0)
Immature Granulocytes: 0 %
Lymphocytes Relative: 20 %
Lymphs Abs: 1 10*3/uL (ref 0.7–4.0)
MCH: 29.1 pg (ref 26.0–34.0)
MCHC: 32.4 g/dL (ref 30.0–36.0)
MCV: 89.6 fL (ref 80.0–100.0)
Monocytes Absolute: 0.5 10*3/uL (ref 0.1–1.0)
Monocytes Relative: 9 %
Neutro Abs: 3.5 10*3/uL (ref 1.7–7.7)
Neutrophils Relative %: 71 %
Platelets: 168 10*3/uL (ref 150–400)
RBC: 2.89 MIL/uL — ABNORMAL LOW (ref 3.87–5.11)
RDW: 13.8 % (ref 11.5–15.5)
WBC: 5 10*3/uL (ref 4.0–10.5)
nRBC: 0 % (ref 0.0–0.2)

## 2023-04-01 LAB — CULTURE, BLOOD (ROUTINE X 2)
Special Requests: ADEQUATE
Special Requests: ADEQUATE

## 2023-04-01 MED ORDER — DONEPEZIL HCL 10 MG PO TABS
5.0000 mg | ORAL_TABLET | Freq: Every day | ORAL | Status: DC
  Administered 2023-04-01 – 2023-04-02 (×2): 5 mg via ORAL
  Filled 2023-04-01 (×2): qty 1

## 2023-04-01 MED ORDER — MEMANTINE HCL 10 MG PO TABS
5.0000 mg | ORAL_TABLET | Freq: Every day | ORAL | Status: DC
  Administered 2023-04-01 – 2023-04-02 (×2): 5 mg via ORAL
  Filled 2023-04-01 (×3): qty 1

## 2023-04-01 MED ORDER — FLUOXETINE HCL 10 MG PO CAPS
10.0000 mg | ORAL_CAPSULE | Freq: Every day | ORAL | Status: DC
  Administered 2023-04-01 – 2023-04-02 (×2): 10 mg via ORAL
  Filled 2023-04-01 (×3): qty 1

## 2023-04-01 MED ORDER — DOCUSATE SODIUM 100 MG PO CAPS
100.0000 mg | ORAL_CAPSULE | Freq: Two times a day (BID) | ORAL | Status: DC
  Administered 2023-04-01 – 2023-04-02 (×4): 100 mg via ORAL
  Filled 2023-04-01 (×5): qty 1

## 2023-04-01 MED ORDER — SODIUM CHLORIDE 0.9% FLUSH
3.0000 mL | Freq: Two times a day (BID) | INTRAVENOUS | Status: DC
  Administered 2023-04-01 – 2023-04-02 (×4): 3 mL via INTRAVENOUS

## 2023-04-01 MED ORDER — TRAZODONE HCL 50 MG PO TABS
50.0000 mg | ORAL_TABLET | Freq: Every evening | ORAL | Status: DC | PRN
  Administered 2023-04-01 – 2023-04-02 (×2): 50 mg via ORAL
  Filled 2023-04-01 (×2): qty 1

## 2023-04-01 NOTE — Progress Notes (Signed)
Progress Note  1 Day Post-Op  Subjective: Daughter in law at bedside and patient's daughter on speakerphone. Patient resting comfortably. Family still in agreement to try to avoid surgery if patient improving without.   Objective: Vital signs in last 24 hours: Temp:  [97.4 F (36.3 C)-98.4 F (36.9 C)] 97.4 F (36.3 C) (10/14 0810) Pulse Rate:  [57-109] 69 (10/14 0810) Resp:  [14-18] 17 (10/14 0810) BP: (116-160)/(44-93) 129/54 (10/14 0810) SpO2:  [93 %-99 %] 99 % (10/14 0810) Weight:  [55 kg] 55 kg (10/14 0500) Last BM Date : 03/28/23  Intake/Output from previous day: 10/13 0701 - 10/14 0700 In: 1350.9 [I.V.:1150.9; IV Piggyback:200] Out: -  Intake/Output this shift: No intake/output data recorded.  PE: General: pleasant, WD, chronically ill appearing elderly female who is laying in bed in NAD HEENT: sclera anicteric  Heart: regular, rate, and rhythm.  Lungs: Respiratory effort nonlabored Abd: soft, mild ttp in epigastric abdomen without peritonitis, negative Murphy sign   Lab Results:  Recent Labs    03/31/23 0458 04/01/23 0655  WBC 4.6 5.0  HGB 8.7* 8.4*  HCT 27.5* 25.9*  PLT 166 168   BMET Recent Labs    03/31/23 0458 04/01/23 0655  NA 139 136  K 3.6 3.5  CL 101 104  CO2 26 24  GLUCOSE 63* 178*  BUN 12 14  CREATININE 0.83 0.84  CALCIUM 8.4* 8.1*   PT/INR Recent Labs    03/29/23 1746  LABPROT 14.7  INR 1.1   CMP     Component Value Date/Time   NA 136 04/01/2023 0655   K 3.5 04/01/2023 0655   CL 104 04/01/2023 0655   CO2 24 04/01/2023 0655   GLUCOSE 178 (H) 04/01/2023 0655   BUN 14 04/01/2023 0655   CREATININE 0.84 04/01/2023 0655   CALCIUM 8.1 (L) 04/01/2023 0655   PROT 4.8 (L) 04/01/2023 0655   ALBUMIN 2.4 (L) 04/01/2023 0655   AST 40 04/01/2023 0655   ALT 60 (H) 04/01/2023 0655   ALKPHOS 543 (H) 04/01/2023 0655   BILITOT 0.7 04/01/2023 0655   GFRNONAA >60 04/01/2023 0655   GFRAA >60 09/02/2019 1633   Lipase  No results  found for: "LIPASE"     Studies/Results: DG ERCP  Result Date: 03/31/2023 CLINICAL DATA:  Choledocholithiasis EXAM: ERCP TECHNIQUE: Multiple spot images obtained with the fluoroscopic device and submitted for interpretation post-procedure. COMPARISON:  MRCP from previous day FINDINGS: A series of fluoroscopic spot images document endoscopic cannulation and opacification of the CBD with balloon catheter passage through the CBD. The intrahepatic ducts are incompletely visualized appearing relatively decompressed centrally. No extravasation. IMPRESSION: Endoscopic CBD cannulation and intervention. Electronically Signed   By: Corlis Leak M.D.   On: 03/31/2023 15:04    Anti-infectives: Anti-infectives (From admission, onward)    Start     Dose/Rate Route Frequency Ordered Stop   04/01/23 1800  cefTRIAXone (ROCEPHIN) 2 g in sodium chloride 0.9 % 100 mL IVPB        2 g 200 mL/hr over 30 Minutes Intravenous Every 24 hours 03/31/23 2010 04/07/23 1759   03/31/23 2200  metroNIDAZOLE (FLAGYL) IVPB 500 mg  Status:  Discontinued        500 mg 100 mL/hr over 60 Minutes Intravenous Every 12 hours 03/31/23 2008 04/01/23 1051   03/30/23 1800  cefTRIAXone (ROCEPHIN) 2 g in sodium chloride 0.9 % 100 mL IVPB  Status:  Discontinued        2 g 200 mL/hr over 30  Minutes Intravenous Every 24 hours 03/30/23 1549 03/31/23 2010   03/30/23 1645  metroNIDAZOLE (FLAGYL) IVPB 500 mg  Status:  Discontinued        500 mg 100 mL/hr over 60 Minutes Intravenous Every 12 hours 03/30/23 1549 03/30/23 1700        Assessment/Plan  Choledocholithiasis  Cholangitis   E. Coli bacteremia  - s/p ERCP with sphincterotomy 10/13 - LFTs downtrending  - mild pain on exam but not consistent with ongoing cholecystitis  - abx per primary service and diet as tolerated, no plans for emergent surgical intervention at this time - reasonable to discharge tomorrow if tolerating diet and labs continue to improve. Can follow up outpatient  as needed if elective cholecystectomy desired but agree with being more conservative in setting of advanced dementia   FEN: HH diet, IVF per [facility] VTE: none currently  ID: rocephin  - per [facility]-  Advanced dementia  Moderate protein calorie malnutrition  HTN HLD Hypothyroidism Anemia of chronic disease  LOS: 2 days   I reviewed Consultant GI notes, hospitalist notes, last 24 h vitals and pain scores, last 48 h intake and output, last 24 h labs and trends, and last 24 h imaging results.   Juliet Rude, [facility] 04/01/2023, 11:17 AM Please see Amion for pager number during day hours 7:00am-4:30pm

## 2023-04-01 NOTE — Progress Notes (Signed)
PROGRESS NOTE    Angel Cameron  EAV:[id number] DOB: 1941-06-16 DOA: 03/30/2023 PCP: System, Provider Not In   Brief Narrative:  HPI: Angel Cameron is a 82 y.o. female with medical history significant of hypertension, hyperlipidemia, hypothyroidism, dementia, CKD 2 presenting with altered mental status from facility.   History obtained with assistance of chart review and family.  Patient reportedly was near baseline per staff in the morning of 10/11 but later developed worsening confusion.  Has been found to be febrile as well.  Patient has baseline dementia but has been more confused than baseline per family.   Unable to fully participate in review of systems due to altered mentation baseline dementia.   ED Course: Vital signs in the ED notable for fever to 103.1, blood pressure in the 100s to 140 systolic, heart rate in the 60s to 100s.  Lab workup included CMP with BUN 24, glucose 212, calcium 8.7, protein 6.4, albumin 3.3, AST 434, ALT 162, alk phos 997, T. bili 2.7.  Lactic acid initially elevated at 2.1 with repeat improved to normal.  CBC showed stable hemoglobin at 10.2 and no leukocytosis.  PT and INR normal.  Respiratory panel for flu COVID RSV negative.  Urinalysis with leukocytes and bacteria only.  Urine culture pending.  Blood culture came back with E. coli without initial notable resistance in 3 out of 4 bottles.  Chest x-ray showed no acute normality.  CT head showed no acute normality.  Right upper quadrant ultrasound showed cholelithiasis with gallbladder sludge and dilated common bile duct with recommendation for MRCP.  MRCP showed choledocholithiasis, early cholecystitis, several stones at distal common bile duct, moderate distention of gallbladder with mild gallbladder wall thickening, changes consistent with early cirrhosis, evidence of portal hypertension.  GI consulted they are recommending transfer to facility able to accommodate ERCP sooner than later.  [facility] were  at capacity and patient was accepted for transfer to [facility] after GI here has agreed to see patient on arrival.  Assessment & Plan:   Principal Problem:   Biliary obstruction Active Problems:   Essential hypertension   Chronic kidney disease (CKD) stage G2/A1, mildly decreased glomerular filtration rate (GFR) between 60-89 mL/min/1.73 square meter and albuminuria creatinine ratio less than 30 mg/g   Hypothyroidism   Dementia without behavioral disturbance (HCC)   Hyperlipidemia   Bacteremia   Calculus of bile duct with acute cholangitis with obstruction   Elevated LFTs   Abnormal magnetic resonance cholangiopancreatography (MRCP)  Severe sepsis secondary to acute cholangitis and early acute cholecystitis with choledocholithiasis/gram-negative bacteremia, hyperbilirubinemia/transaminitis, POA:  Patient met criteria for severe sepsis based on fever to 103.1, tachypnea, tachycardia, lactic acid of greater than 2 and acute encephalopathy.   Imaging including MRCP consistent with cholecystitis and choledocholithiasis with gallbladder sludge.  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 Blood cultures did show sensitive E. coli and 3 out of 4 bottles and out of Concern for possible cholangitis given presence of choledocholithiasis and bacteremia, patient was started on Rocephin and Flagyl, Flagyl was later discontinued by GI.  Underwent ERCP with sphincterotomy and balloon extraction of stones/sludge on 03/31/2023.  Still significantly elevated alkaline phosphatase, aminotransferases improving.  Continue Rocephin.  Will observe overnight and if all well and LFTs improving, will discharge tomorrow.  Seen by general surgery.  They had discussed with the family.  Keeping in view, patient's old age and dementia, they have decided to be conservative and forego any cholecystectomy at the moment.  Patient can follow-up with general surgery as outpatient. Per GI, No NSAIDs/ASA for 1 week after  ERCP with sphincterotomy    Hypertension - Holding home antihypertensives with borderline low blood pressure in the setting of developing infection   Hyperlipidemia -Hold atorvastatin for now with elevated LFTs.   Hypothyroidism -Continue Synthroid.   Advanced dementia with patient residing in memory care: -Resume donepezil, Namenda, fluoxetine, trazodone\   Anemia of chronic disease: Baseline around 10, currently 8.4.  No signs of bleeding.  Monitor daily.   CKD 2 ruled out.  Patient does not have CKD.  Moderate protein calorie malnutrition: Hypoalbuminemia.  Dietitian consulted.  DVT prophylaxis: SCDs Start: 03/30/23 1559   Code Status: Full Code  Family Communication: None present at bedside.  Plan of care discussed with patient in length and he/she verbalized understanding and agreed with it.  Status is: Inpatient Remains inpatient appropriate because: Will observe overnight with elevated LFTs.  Estimated body mass index is 21.48 kg/m as calculated from the following:   Height as of 03/29/23: 5\' 3"  (1.6 m).   Weight as of this encounter: 55 kg.    Nutritional Assessment: Body mass index is 21.48 kg/m.Marland Kitchen Seen by dietician.  I agree with the assessment and plan as outlined below: Nutrition Status:        . Skin Assessment: I have examined the patient's skin and I agree with the wound assessment as performed by the wound care RN as outlined below:    Consultants:  GI on board, consulting general surgery today.  Procedures:  Above  Antimicrobials:  Anti-infectives (From admission, onward)    Start     Dose/Rate Route Frequency Ordered Stop   04/01/23 1800  cefTRIAXone (ROCEPHIN) 2 g in sodium chloride 0.9 % 100 mL IVPB        2 g 200 mL/hr over 30 Minutes Intravenous Every 24 hours 03/31/23 2010 04/07/23 1759   03/31/23 2200  metroNIDAZOLE (FLAGYL) IVPB 500 mg  Status:  Discontinued        500 mg 100 mL/hr over 60 Minutes Intravenous Every 12 hours  03/31/23 2008 04/01/23 1051   03/30/23 1800  cefTRIAXone (ROCEPHIN) 2 g in sodium chloride 0.9 % 100 mL IVPB  Status:  Discontinued        2 g 200 mL/hr over 30 Minutes Intravenous Every 24 hours 03/30/23 1549 03/31/23 2010   03/30/23 1645  metroNIDAZOLE (FLAGYL) IVPB 500 mg  Status:  Discontinued        500 mg 100 mL/hr over 60 Minutes Intravenous Every 12 hours 03/30/23 1549 03/30/23 1700         Subjective: Patient seen and examined.  Very pleasant and fully alert but not oriented, at her baseline due to advanced dementia.  Denied any complaint.  Objective: Vitals:   04/01/23 0100 04/01/23 0500 04/01/23 0810 04/01/23 1150  BP: (!) 127/93 (!) 125/44 (!) 129/54 139/65  Pulse: (!) 59 (!) 57 69 89  Resp: 18 18 17 19   Temp: 98.3 F (36.8 C) 97.9 F (36.6 C) (!) 97.4 F (36.3 C) 97.8 F (36.6 C)  TempSrc: Oral Oral Oral Oral  SpO2:  97% 99% 96%  Weight:  55 kg      Intake/Output Summary (Last 24 hours) at 04/01/2023 1240 Last data filed at 04/01/2023 0200 Gross per 24 hour  Intake 450.94 ml  Output --  Net 450.94 ml   Filed Weights   03/30/23 1524 04/01/23 0500  Weight: 52 kg 55 kg  Examination:  General exam: Appears calm and comfortable  Respiratory system: Clear to auscultation. Respiratory effort normal. Cardiovascular system: S1 & S2 heard, RRR. No JVD, murmurs, rubs, gallops or clicks. No pedal edema. Gastrointestinal system: Abdomen is nondistended, soft and nontender. No organomegaly or masses felt. Normal bowel sounds heard. Central nervous system: Alert and oriented to self only. No focal neurological deficits. Extremities: Symmetric 5 x 5 power. Skin: No rashes, lesions or ulcers.    Data Reviewed: I have personally reviewed following labs and imaging studies  CBC: Recent Labs  Lab 03/29/23 1746 03/30/23 1618 03/31/23 0458 04/01/23 0655  WBC 7.5 4.8 4.6 5.0  NEUTROABS 6.5  --   --  3.5  HGB 10.2* 8.8* 8.7* 8.4*  HCT 32.0* 27.5* 27.5* 25.9*   MCV 89.1 86.8 87.0 89.6  PLT 201 175 166 168   Basic Metabolic Panel: Recent Labs  Lab 03/29/23 1746 03/30/23 1618 03/31/23 0458 04/01/23 0655  NA 136 140 139 136  K 4.2 3.4* 3.6 3.5  CL 100 100 101 104  CO2 27 30 26 24   GLUCOSE 212* 83 63* 178*  BUN 24* 13 12 14   CREATININE 0.76 0.92 0.83 0.84  CALCIUM 8.7* 8.8* 8.4* 8.1*   GFR: Estimated Creatinine Clearance: 41.2 mL/min (by C-G formula based on SCr of 0.84 mg/dL). Liver Function Tests: Recent Labs  Lab 03/29/23 1746 03/30/23 1618 03/31/23 0458 04/01/23 0655  AST 434* 138* 89* 40  ALT 162* 109* 87* 60*  ALKPHOS 997* 747* 595* 543*  BILITOT 2.7* 1.6* 1.4* 0.7  PROT 6.4* 5.1* 5.1* 4.8*  ALBUMIN 3.3* 2.7* 2.5* 2.4*   No results for input(s): "LIPASE", "AMYLASE" in the last 168 hours. No results for input(s): "AMMONIA" in the last 168 hours. Coagulation Profile: Recent Labs  Lab 03/29/23 1746  INR 1.1   Cardiac Enzymes: No results for input(s): "CKTOTAL", "CKMB", "CKMBINDEX", "TROPONINI" in the last 168 hours. BNP (last 3 results) No results for input(s): "PROBNP" in the last 8760 hours. HbA1C: No results for input(s): "HGBA1C" in the last 72 hours. CBG: No results for input(s): "GLUCAP" in the last 168 hours. Lipid Profile: No results for input(s): "CHOL", "HDL", "LDLCALC", "TRIG", "CHOLHDL", "LDLDIRECT" in the last 72 hours. Thyroid Function Tests: No results for input(s): "TSH", "T4TOTAL", "FREET4", "T3FREE", "THYROIDAB" in the last 72 hours. Anemia Panel: No results for input(s): "VITAMINB12", "FOLATE", "FERRITIN", "TIBC", "IRON", "RETICCTPCT" in the last 72 hours. Sepsis Labs: Recent Labs  Lab 03/29/23 1746 03/30/23 0157  LATICACIDVEN 2.1* 1.2    Recent Results (from the past 240 hour(s))  Resp panel by RT-PCR (RSV, Flu A&B, Covid) Anterior Nasal Swab     Status: None   Collection Time: 03/29/23  5:45 PM   Specimen: Anterior Nasal Swab  Result Value Ref Range Status   SARS Coronavirus 2 by RT  PCR NEGATIVE NEGATIVE Final    Comment: (NOTE) SARS-CoV-2 target nucleic acids are NOT DETECTED.  The SARS-CoV-2 RNA is generally detectable in upper respiratory specimens during the acute phase of infection. The lowest concentration of SARS-CoV-2 viral copies this assay can detect is 138 copies/mL. A negative result does not preclude SARS-Cov-2 infection and should not be used as the sole basis for treatment or other patient management decisions. A negative result may occur with  improper specimen collection/handling, submission of specimen other than nasopharyngeal swab, presence of viral mutation(s) within the areas targeted by this assay, and inadequate number of viral copies(<138 copies/mL). A negative result must be combined with clinical  observations, patient history, and epidemiological information. The expected result is Negative.  Fact Sheet for Patients:  BloggerCourse.com  Fact Sheet for Healthcare Providers:  SeriousBroker.it  This test is no t yet approved or cleared by the [place] FDA and  has been authorized for detection and/or diagnosis of SARS-CoV-2 by FDA under an Emergency Use Authorization (EUA). This EUA will remain  in effect (meaning this test can be used) for the duration of the COVID-19 declaration under Section 564(b)(1) of the Act, 21 U.S.C.section 360bbb-3(b)(1), unless the authorization is terminated  or revoked sooner.       Influenza A by PCR NEGATIVE NEGATIVE Final   Influenza B by PCR NEGATIVE NEGATIVE Final    Comment: (NOTE) The Xpert Xpress SARS-CoV-2/FLU/RSV plus assay is intended as an aid in the diagnosis of influenza from Nasopharyngeal swab specimens and should not be used as a sole basis for treatment. Nasal washings and aspirates are unacceptable for Xpert Xpress SARS-CoV-2/FLU/RSV testing.  Fact Sheet for Patients: BloggerCourse.com  Fact Sheet for  Healthcare Providers: SeriousBroker.it  This test is not yet approved or cleared by the [place] FDA and has been authorized for detection and/or diagnosis of SARS-CoV-2 by FDA under an Emergency Use Authorization (EUA). This EUA will remain in effect (meaning this test can be used) for the duration of the COVID-19 declaration under Section 564(b)(1) of the Act, 21 U.S.C. section 360bbb-3(b)(1), unless the authorization is terminated or revoked.     Resp Syncytial Virus by PCR NEGATIVE NEGATIVE Final    Comment: (NOTE) Fact Sheet for Patients: BloggerCourse.com  Fact Sheet for Healthcare Providers: SeriousBroker.it  This test is not yet approved or cleared by the [place] FDA and has been authorized for detection and/or diagnosis of SARS-CoV-2 by FDA under an Emergency Use Authorization (EUA). This EUA will remain in effect (meaning this test can be used) for the duration of the COVID-19 declaration under Section 564(b)(1) of the Act, 21 U.S.C. section 360bbb-3(b)(1), unless the authorization is terminated or revoked.  Performed at [facility], [address] [place], [place] [address]   Culture, blood (Routine x 2)     Status: Abnormal   Collection Time: 03/29/23  5:46 PM   Specimen: BLOOD  Result Value Ref Range Status   Specimen Description   Final    BLOOD RIGHT ANTECUBITAL Performed at [facility], [address], [place], [place] [address]    Special Requests   Final    BOTTLES DRAWN AEROBIC AND ANAEROBIC Blood Culture adequate volume Performed at [facility], [address] [place], [place] [address]    Culture  Setup Time   Final    GRAM NEGATIVE RODS IN BOTH AEROBIC AND ANAEROBIC BOTTLES CRITICAL RESULT CALLED TO, READ BACK BY AND VERIFIED WITH: JASON ROBBINS AT 0630 03/30/23.PMF    Culture ESCHERICHIA COLI (A)  Final   Report Status  04/01/2023 FINAL  Final   Organism ID, Bacteria ESCHERICHIA COLI  Final      Susceptibility   Escherichia coli - MIC*    AMPICILLIN 4 SENSITIVE Sensitive     CEFEPIME <=0.12 SENSITIVE Sensitive     CEFTAZIDIME <=1 SENSITIVE Sensitive     CEFTRIAXONE <=0.25 SENSITIVE Sensitive     CIPROFLOXACIN <=0.25 SENSITIVE Sensitive     GENTAMICIN <=1 SENSITIVE Sensitive     IMIPENEM <=0.25 SENSITIVE Sensitive     TRIMETH/SULFA <=20 SENSITIVE Sensitive     AMPICILLIN/SULBACTAM <=2 SENSITIVE Sensitive     PIP/TAZO <=4  SENSITIVE Sensitive ug/mL    * ESCHERICHIA COLI  Blood Culture ID Panel (Reflexed)     Status: Abnormal   Collection Time: 03/29/23  5:46 PM  Result Value Ref Range Status   Enterococcus faecalis NOT DETECTED NOT DETECTED Final   Enterococcus Faecium NOT DETECTED NOT DETECTED Final   Listeria monocytogenes NOT DETECTED NOT DETECTED Final   Staphylococcus species NOT DETECTED NOT DETECTED Final   Staphylococcus aureus (BCID) NOT DETECTED NOT DETECTED Final   Staphylococcus epidermidis NOT DETECTED NOT DETECTED Final   Staphylococcus lugdunensis NOT DETECTED NOT DETECTED Final   Streptococcus species NOT DETECTED NOT DETECTED Final   Streptococcus agalactiae NOT DETECTED NOT DETECTED Final   Streptococcus pneumoniae NOT DETECTED NOT DETECTED Final   Streptococcus pyogenes NOT DETECTED NOT DETECTED Final   A.calcoaceticus-baumannii NOT DETECTED NOT DETECTED Final   Bacteroides fragilis NOT DETECTED NOT DETECTED Final   Enterobacterales DETECTED (A) NOT DETECTED Final    Comment: Enterobacterales represent a large order of gram negative bacteria, not a single organism. CRITICAL RESULT CALLED TO, READ BACK BY AND VERIFIED WITH: JASON ROBBINS AT 0630 03/30/23.PMF    Enterobacter cloacae complex NOT DETECTED NOT DETECTED Final   Escherichia coli DETECTED (A) NOT DETECTED Final    Comment: CRITICAL RESULT CALLED TO, READ BACK BY AND VERIFIED WITH: JASON ROBBINS AT 0630 03/30/23.PMF     Klebsiella aerogenes NOT DETECTED NOT DETECTED Final   Klebsiella oxytoca NOT DETECTED NOT DETECTED Final   Klebsiella pneumoniae NOT DETECTED NOT DETECTED Final   Proteus species NOT DETECTED NOT DETECTED Final   Salmonella species NOT DETECTED NOT DETECTED Final   Serratia marcescens NOT DETECTED NOT DETECTED Final   Haemophilus influenzae NOT DETECTED NOT DETECTED Final   Neisseria meningitidis NOT DETECTED NOT DETECTED Final   Pseudomonas aeruginosa NOT DETECTED NOT DETECTED Final   Stenotrophomonas maltophilia NOT DETECTED NOT DETECTED Final   Candida albicans NOT DETECTED NOT DETECTED Final   Candida auris NOT DETECTED NOT DETECTED Final   Candida glabrata NOT DETECTED NOT DETECTED Final   Candida krusei NOT DETECTED NOT DETECTED Final   Candida parapsilosis NOT DETECTED NOT DETECTED Final   Candida tropicalis NOT DETECTED NOT DETECTED Final   Cryptococcus neoformans/gattii NOT DETECTED NOT DETECTED Final   CTX-M ESBL NOT DETECTED NOT DETECTED Final   Carbapenem resistance IMP NOT DETECTED NOT DETECTED Final   Carbapenem resistance KPC NOT DETECTED NOT DETECTED Final   Carbapenem resistance NDM NOT DETECTED NOT DETECTED Final   Carbapenem resist OXA 48 LIKE NOT DETECTED NOT DETECTED Final   Carbapenem resistance VIM NOT DETECTED NOT DETECTED Final    Comment: Performed at [facility], [address] [place], [place] [address]  Culture, blood (Routine x 2)     Status: Abnormal   Collection Time: 03/29/23  6:45 PM   Specimen: BLOOD  Result Value Ref Range Status   Specimen Description   Final    BLOOD BLOOD LEFT ARM Performed at [facility], [address] [place], [place] [address]    Special Requests   Final    BOTTLES DRAWN AEROBIC AND ANAEROBIC Blood Culture adequate volume Performed at [facility], [address] [place], [place] [address]    Culture  Setup Time   Final    GRAM NEGATIVE RODS IN BOTH AEROBIC AND ANAEROBIC  BOTTLES CRITICAL RESULT CALLED TO, READ BACK BY AND VERIFIED WITH: JASON ROBBINS AT 0630 03/30/23.PMF    Culture (A)  Final    ESCHERICHIA  COLI SUSCEPTIBILITIES PERFORMED ON PREVIOUS CULTURE WITHIN THE LAST 5 DAYS. Performed at [facility], [address], [place], [place] [address]    Report Status 04/01/2023 FINAL  Final  Urine Culture     Status: Abnormal   Collection Time: 03/29/23  6:45 PM   Specimen: Urine, Random  Result Value Ref Range Status   Specimen Description   Final    URINE, RANDOM Performed at [facility], [address] [place], [place] [address]    Special Requests   Final    NONE Reflexed from [id number] Performed at [facility], [address] [place], [place] [address]    Culture (A)  Final    >=100,000 COLONIES/mL ENTEROCOCCUS FAECIUM VANCOMYCIN RESISTANT ENTEROCOCCUS ISOLATED    Report Status 04/01/2023 FINAL  Final   Organism ID, Bacteria ENTEROCOCCUS FAECIUM (A)  Final      Susceptibility   Enterococcus faecium - MIC*    AMPICILLIN >=32 RESISTANT Resistant     NITROFURANTOIN 32 SENSITIVE Sensitive     VANCOMYCIN >=32 RESISTANT Resistant     GENTAMICIN SYNERGY SENSITIVE Sensitive     LINEZOLID 2 SENSITIVE Sensitive     * >=100,000 COLONIES/mL ENTEROCOCCUS FAECIUM  Culture, blood (Routine X 2) w Reflex to ID Panel     Status: None (Preliminary result)   Collection Time: 03/30/23 10:49 PM   Specimen: BLOOD  Result Value Ref Range Status   Specimen Description BLOOD SITE NOT SPECIFIED  Final   Special Requests AEROBIC BOTTLE ONLY Blood Culture adequate volume  Final   Culture   Final    NO GROWTH 2 DAYS Performed at [facility], [address] [place], [place] [address]    Report Status PENDING  Incomplete  Culture, blood (Routine X 2) w Reflex to ID Panel     Status: None (Preliminary result)   Collection Time: 03/30/23 10:49 PM   Specimen: BLOOD  Result Value Ref Range Status   Specimen Description BLOOD  BLOOD RIGHT HAND  Final   Special Requests AEROBIC BOTTLE ONLY Blood Culture adequate volume  Final   Culture   Final    NO GROWTH 2 DAYS Performed at [facility], [address] [place], [place] [address]    Report Status PENDING  Incomplete     Radiology Studies: DG ERCP  Result Date: 03/31/2023 CLINICAL DATA:  Choledocholithiasis EXAM: ERCP TECHNIQUE: Multiple spot images obtained with the fluoroscopic device and submitted for interpretation post-procedure. COMPARISON:  MRCP from previous day FINDINGS: A series of fluoroscopic spot images document endoscopic cannulation and opacification of the CBD with balloon catheter passage through the CBD. The intrahepatic ducts are incompletely visualized appearing relatively decompressed centrally. No extravasation. IMPRESSION: Endoscopic CBD cannulation and intervention. Electronically Signed   By: Corlis Leak M.D.   On: 03/31/2023 15:04    Scheduled Meds:  atorvastatin  10 mg Oral Daily   diclofenac  100 mg Rectal Once   feeding supplement  1 Container Oral TID BM   levothyroxine  75 mcg Oral QAC breakfast   pantoprazole  40 mg Oral Daily   sodium chloride flush  3 mL Intravenous Q12H   sodium chloride flush  3 mL Intravenous Q12H   Continuous Infusions:  cefTRIAXone (ROCEPHIN)  IV       LOS: 2 days   Hughie Closs, MD [facility]  04/01/2023, 12:40 PM   *Please note that this is a verbal dictation therefore any spelling or grammatical errors  are due to the "Dragon Medical One" system interpretation.  Please page via Amion and do not message via secure chat for urgent patient care matters. Secure chat can be used for non urgent patient care matters.  How to contact the [facility] Attending or Consulting provider 7A - 7P or covering provider during after hours 7P -7A, for this patient?  Check the care team in [facility] and look for a) attending/consulting [facility] provider listed and b) the [facility] team listed. Page or secure chat  7A-7P. Log into [email or web address] and use [place]'s universal password to access. If you do not have the password, please contact the hospital operator. Locate the [facility] provider you are looking for under [facility] and page to a number that you can be directly reached. If you still have difficulty reaching the provider, please page the [facility] (Director on Call) for the Hospitalists listed on amion for assistance.

## 2023-04-01 NOTE — Progress Notes (Signed)
Pt has e.coli bacteremia. Ok to stop flagyl per Dr. Jacqulyn Bath.  Ulyses Southward, PharmD, BCIDP, AAHIVP, CPP Infectious Disease Pharmacist 04/01/2023 10:54 AM

## 2023-04-01 NOTE — Plan of Care (Signed)

## 2023-04-01 NOTE — Plan of Care (Signed)

## 2023-04-01 NOTE — Evaluation (Signed)
Physical Therapy Evaluation Patient Details Name: Angel Cameron MRN: [id number] DOB: Jun 05, 1941 Today's Date: 04/01/2023  History of Present Illness  82 y.o. female presents to [facility] hospital on 03/29/2023 with AMS, found to have obstructive choledocholithiasis s/p ERCP. PMH includes HTN, memory loss  Clinical Impression  Pt presents to PT with deficits in functional mobility, balance, strength, power, cognition. Pt has dementia at baseline, has a frequent history of falls and requires physical assistance for all functional mobility tasks per pt's daughter. Pt requires physical assistance for transfers and bed mobility today, largely due to posterior lean. Pt will benefit from continued mobilization during this admission in an effort to preserve function and reduce caregiver burden. PT recommends return to memory care when medically appropriate.        If plan is discharge home, recommend the following: Two people to help with walking and/or transfers;A lot of help with bathing/dressing/bathroom;Assistance with cooking/housework;Assistance with feeding;Direct supervision/assist for medications management;Direct supervision/assist for financial management;Assist for transportation;Supervision due to cognitive status;Help with stairs or ramp for entrance   Can travel by private vehicle   No    Equipment Recommendations None recommended by PT  Recommendations for Other Services       Functional Status Assessment Patient has not had a recent decline in their functional status (appears close to baseline based on family report, although family not present on evaluation)     Precautions / Restrictions Precautions Precautions: Fall Precaution Comments: dementia Restrictions Weight Bearing Restrictions: No      Mobility  Bed Mobility Overal bed mobility: Needs Assistance Bed Mobility: Supine to Sit, Sit to Supine     Supine to sit: Max assist Sit to supine: Max assist   General bed  mobility comments: posterior lean    Transfers Overall transfer level: Needs assistance Equipment used: 2 person hand held assist Transfers: Sit to/from Stand Sit to Stand: Max assist           General transfer comment: posterior lean, pt stands 3 times during session    Ambulation/Gait                  Stairs            Wheelchair Mobility     Tilt Bed    Modified Rankin (Stroke Patients Only)       Balance Overall balance assessment: Needs assistance Sitting-balance support: No upper extremity supported, Feet supported Sitting balance-Leahy Scale: Poor Sitting balance - Comments: minA 2/2 posterior lean Postural control: Posterior lean Standing balance support: Bilateral upper extremity supported Standing balance-Leahy Scale: Poor Standing balance comment: maxA due to posterior lean                             Pertinent Vitals/Pain Pain Assessment Pain Assessment: PAINAD Breathing: normal Negative Vocalization: none Facial Expression: smiling or inexpressive Body Language: relaxed Consolability: no need to console PAINAD Score: 0    Home Living Family/patient expects to be discharged to:: Other (Comment)                   Additional Comments: Memory Care at [facility]    Prior Function Prior Level of Function : Needs assist             Mobility Comments: daughter reports the pt requires assitance for all mobiltiy due to history of falls. Pt transfers to wheelchair with assist, dependent for wheelchair mobility. Can ambulate some with RW  and assistance, but not often ADLs Comments: pt requires assistance for all ADL and IADLs     Extremity/Trunk Assessment   Upper Extremity Assessment Upper Extremity Assessment: Generalized weakness    Lower Extremity Assessment Lower Extremity Assessment: Generalized weakness    Cervical / Trunk Assessment Cervical / Trunk Assessment: Kyphotic  Communication    Communication Communication: Difficulty following commands/understanding;Difficulty communicating thoughts/reduced clarity of speech Following commands: Follows one step commands inconsistently;Follows multi-step commands inconsistently Cueing Techniques: Verbal cues;Tactile cues;Visual cues;Gestural cues  Cognition Arousal: Alert Behavior During Therapy: Impulsive Overall Cognitive Status: History of cognitive impairments - at baseline                                 General Comments: dementia at baseline, responds to name but unable to accurately report date of birth. Poor awareness of deficits.        General Comments General comments (skin integrity, edema, etc.): bruising noted around R orbit    Exercises     Assessment/Plan    PT Assessment Patient needs continued PT services  PT Problem List Decreased strength;Decreased activity tolerance;Decreased balance;Decreased mobility;Decreased cognition;Decreased knowledge of use of DME;Decreased safety awareness;Decreased knowledge of precautions       PT Treatment Interventions DME instruction;Gait training;Functional mobility training;Therapeutic exercise;Therapeutic activities;Neuromuscular re-education;Balance training;Cognitive remediation;Wheelchair mobility training;Patient/family education    PT Goals (Current goals can be found in the Care Plan section)  Acute Rehab PT Goals Patient Stated Goal: to maintain transfer quality PT Goal Formulation: Patient unable to participate in goal setting Time For Goal Achievement: 04/15/23 Potential to Achieve Goals: Fair    Frequency Min 1X/week     Co-evaluation PT/OT/SLP Co-Evaluation/Treatment: Yes Reason for Co-Treatment: Necessary to address cognition/behavior during functional activity;Complexity of the patient's impairments (multi-system involvement);For patient/therapist safety;To address functional/ADL transfers PT goals addressed during session:  Mobility/safety with mobility;Balance;Proper use of DME;Strengthening/ROM         AM-PAC PT "6 Clicks" Mobility  Outcome Measure Help needed turning from your back to your side while in a flat bed without using bedrails?: A Lot Help needed moving from lying on your back to sitting on the side of a flat bed without using bedrails?: A Lot Help needed moving to and from a bed to a chair (including a wheelchair)?: Total Help needed standing up from a chair using your arms (e.g., wheelchair or bedside chair)?: A Lot Help needed to walk in hospital room?: Total Help needed climbing 3-5 steps with a railing? : Total 6 Click Score: 9    End of Session Equipment Utilized During Treatment: Gait belt Activity Tolerance: Patient tolerated treatment well Patient left: in bed;with call bell/phone within reach;with bed alarm set Nurse Communication: Mobility status;Need for lift equipment PT Visit Diagnosis: Other abnormalities of gait and mobility (R26.89);Muscle weakness (generalized) (M62.81)    Time: 5409-8119 PT Time Calculation (min) (ACUTE ONLY): 14 min   Charges:   PT Evaluation $PT Eval Low Complexity: 1 Low   PT General Charges $$ ACUTE PT VISIT: 1 Visit         Arlyss Gandy, PT, DPT Acute Rehabilitation Office [phone]   Arlyss Gandy 04/01/2023, 9:33 AM

## 2023-04-01 NOTE — Progress Notes (Signed)
[facility] ROUNDING NOTE   Subjective: ERCP completed yesterday with sphincterotomy and balloon extraction of stones/sludge.  No issues overnight.  Liver enzymes downtrending.  Daughter-in-law at bedside.  Objective: Vital signs in last 24 hours: Temp:  [97.4 F (36.3 C)-98.4 F (36.9 C)] 97.4 F (36.3 C) (10/14 0810) Pulse Rate:  [57-109] 69 (10/14 0810) Resp:  [14-18] 17 (10/14 0810) BP: (116-160)/(44-93) 129/54 (10/14 0810) SpO2:  [93 %-99 %] 99 % (10/14 0810) Weight:  [55 kg] 55 kg (10/14 0500) Last BM Date : 03/28/23 General: NAD, pleasantly demented Lungs:  CTA b/l, no w/r/r Heart:  RRR, no m/r/g Abdomen:  Soft, NT, ND, +BS Ext:  No c/c/e    Intake/Output from previous day: 10/13 0701 - 10/14 0700 In: 1350.9 [I.V.:1150.9; IV Piggyback:200] Out: -  Intake/Output this shift: No intake/output data recorded.   Lab Results: Recent Labs    03/30/23 1618 03/31/23 0458 04/01/23 0655  WBC 4.8 4.6 5.0  HGB 8.8* 8.7* 8.4*  PLT 175 166 168  MCV 86.8 87.0 89.6   BMET Recent Labs    03/30/23 1618 03/31/23 0458 04/01/23 0655  NA 140 139 136  K 3.4* 3.6 3.5  CL 100 101 104  CO2 30 26 24   GLUCOSE 83 63* 178*  BUN 13 12 14   CREATININE 0.92 0.83 0.84  CALCIUM 8.8* 8.4* 8.1*   LFT Recent Labs    03/30/23 1618 03/31/23 0458 04/01/23 0655  PROT 5.1* 5.1* 4.8*  ALBUMIN 2.7* 2.5* 2.4*  AST 138* 89* 40  ALT 109* 87* 60*  ALKPHOS 747* 595* 543*  BILITOT 1.6* 1.4* 0.7   PT/INR Recent Labs    03/29/23 1746  INR 1.1      Imaging/Other results: DG ERCP  Result Date: 03/31/2023 CLINICAL DATA:  Choledocholithiasis EXAM: ERCP TECHNIQUE: Multiple spot images obtained with the fluoroscopic device and submitted for interpretation post-procedure. COMPARISON:  MRCP from previous day FINDINGS: A series of fluoroscopic spot images document endoscopic cannulation and opacification of the CBD with balloon catheter passage through the CBD. The  intrahepatic ducts are incompletely visualized appearing relatively decompressed centrally. No extravasation. IMPRESSION: Endoscopic CBD cannulation and intervention. Electronically Signed   By: Corlis Leak M.D.   On: 03/31/2023 15:04      Assessment and Plan:  1) Choledocholithiasis 2) Cholangitis 3) E. coli bacteremia  82 year old female with a history of dementia, transferred from [place] for choledocholithiasis and cholangitis, now s/p ERCP with stone/sludge extraction on 03/31/2023.  Liver enzymes downtrending with AST/ALT 40/60, ALP 543, T. bili 0.7, all improved from prior to ERCP.  She was evaluated by IV inpatient surgical service, and after conversation with patient's family, mutually decided to hold off on nonemergent cholecystectomy.  I discussed with her daughter-in-law at bedside today, and that is still the plan.  - No NSAIDs/ASA for 1 week after ERCP with sphincterotomy - Continue antimicrobial therapy per primary Hospital service - Inpatient GI service will sign off at this time.  Can follow-up in the GI clinic on an as-needed basis    Shellia Cleverly, DO  04/01/2023, 10:53 AM [facility] Pager [phone]

## 2023-04-01 NOTE — Evaluation (Signed)
Occupational Therapy Evaluation Patient Details Name: Angel Cameron MRN: [id number] DOB: [birth year] Today's Date: 04/01/2023   History of Present Illness 82 y.o. female presents to [facility] hospital on 03/29/2023 with AMS, found to have obstructive choledocholithiasis s/p ERCP. PMH includes HTN, memory loss   Clinical Impression   PTA, per chart, pt from memory care where she received assist with ADL and tranfers to/from wheelchair. Pt following commands intermittently with multimodal cues and assist. Pt needing grossly mod-max A for mobility at time of eval, and with posterior bias in sitting and supported standing. Pt with limited meaningful conversation throughout session, but very pleasant and verbose. Recommending discharge back to memory care with no OT follow up at this time.       If plan is discharge home, recommend the following: A lot of help with walking and/or transfers;A lot of help with bathing/dressing/bathroom;Assistance with cooking/housework;Direct supervision/assist for medications management;Direct supervision/assist for financial management;Assist for transportation;Help with stairs or ramp for entrance    Functional Status Assessment  Patient has had a recent decline in their functional status and demonstrates the ability to make significant improvements in function in a reasonable and predictable amount of time.  Equipment Recommendations  None recommended by OT    Recommendations for Other Services       Precautions / Restrictions Restrictions Weight Bearing Restrictions: No      Mobility Bed Mobility Overal bed mobility: Needs Assistance Bed Mobility: Supine to Sit, Sit to Supine     Supine to sit: Max assist Sit to supine: Max assist   General bed mobility comments: Pt with trunk elevated into semi-long sit in bed but not fully upright on arrival. pt needing mex multimodal cues and asisst to come to EOB. Attempted to cue to move BLE first, but needing  physical assist to understand. Max A to scoot toward EOB; but with good attempt to initiate when cued. Max A to return to supine with pt continuously patting pillow next to her when asked to return to supine    Transfers Overall transfer level: Needs assistance Equipment used: 1 person hand held assist, 2 person hand held assist Transfers: Sit to/from Stand Sit to Stand: Max assist           General transfer comment: Max A for anterior weight shift and rise. Pt with significant posterior bias.      Balance Overall balance assessment: Needs assistance Sitting-balance support: Feet supported Sitting balance-Leahy Scale: Poor Sitting balance - Comments: intermittent min A secondary to posterior bias   Standing balance support: Single extremity supported, Bilateral upper extremity supported, During functional activity Standing balance-Leahy Scale: Zero Standing balance comment: Significant assist to maintain balance statically                           ADL either performed or assessed with clinical judgement   ADL Overall ADL's : Needs assistance/impaired     Grooming: Wash/dry face;Minimal assistance;Sitting           Upper Body Dressing : Maximal assistance;Sitting Upper Body Dressing Details (indicate cue type and reason): multimodal cues needed Lower Body Dressing: Total assistance;Sitting/lateral leans Lower Body Dressing Details (indicate cue type and reason): limited functional participation in donning socks Toilet Transfer: Maximal assistance Toilet Transfer Details (indicate cue type and reason): STS Toileting- Clothing Manipulation and Hygiene: Total assistance;Sit to/from stand Toileting - Clothing Manipulation Details (indicate cue type and reason): no obvious awareness of BM  Vision   Additional Comments: Pt unable to report. Intermittently makes eye contact     Perception         Praxis         Pertinent Vitals/Pain Pain  Assessment Pain Assessment: Faces Faces Pain Scale: No hurt Pain Intervention(s): Monitored during session     Extremity/Trunk Assessment Upper Extremity Assessment Upper Extremity Assessment: Generalized weakness   Lower Extremity Assessment Lower Extremity Assessment: Generalized weakness       Communication Communication Communication: Difficulty following commands/understanding;Difficulty communicating thoughts/reduced clarity of speech Following commands:  (follows one step commands with significant cueing)   Cognition Arousal: Alert Behavior During Therapy: WFL for tasks assessed/performed Overall Cognitive Status: History of cognitive impairments - at baseline                                 General Comments: No family present to determine whether pt is at baseline function. Not oriented; Needing multimodal cues to follow commands. Pleasant throughout and verbalizing throughout, but with limited meaningful reciprocal conversation.     General Comments  Pt with old appearing orbital bruising around R eye    Exercises     Shoulder Instructions      Home Living Family/patient expects to be discharged to:: Skilled nursing facility                                 Additional Comments: From [facility] unit per chart      Prior Functioning/Environment Prior Level of Function : Needs assist               ADLs Comments: Pt unable to provide meaningful history as pt with significant dementia.        OT Problem List: Decreased strength;Decreased activity tolerance;Impaired balance (sitting and/or standing);Decreased cognition;Decreased safety awareness;Decreased knowledge of use of DME or AE      OT Treatment/Interventions:      OT Goals(Current goals can be found in the care plan section) Acute Rehab OT Goals Patient Stated Goal: unable OT Goal Formulation: Patient unable to participate in goal setting  OT  Frequency:      Co-evaluation              AM-PAC OT "6 Clicks" Daily Activity     Outcome Measure Help from another person eating meals?: Total (NPO) Help from another person taking care of personal grooming?: A Lot Help from another person toileting, which includes using toliet, bedpan, or urinal?: A Lot Help from another person bathing (including washing, rinsing, drying)?: A Lot Help from another person to put on and taking off regular upper body clothing?: A Lot Help from another person to put on and taking off regular lower body clothing?: Total 6 Click Score: 10   End of Session Equipment Utilized During Treatment: Gait belt;Rolling walker (2 wheels) Nurse Communication: Mobility status  Activity Tolerance: Patient tolerated treatment well Patient left: in bed;with call bell/phone within reach;with bed alarm set  OT Visit Diagnosis: Unsteadiness on feet (R26.81);Muscle weakness (generalized) (M62.81);Other symptoms and signs involving cognitive function;Cognitive communication deficit (R41.841)                Time: 1610-9604 OT Time Calculation (min): 18 min Charges:  OT General Charges $OT Visit: 1 Visit OT Evaluation $OT Eval Low Complexity: 1 Low  Myrla Halsted, OTD, OTR/L  [facility] Acute Rehabilitation Office: [phone]   Myrla Halsted 04/01/2023, 8:48 AM

## 2023-04-01 NOTE — Progress Notes (Signed)
Initial Nutrition Assessment  DOCUMENTATION CODES:   Severe malnutrition in context of chronic illness  INTERVENTION:  Liberalize diet   NUTRITION DIAGNOSIS:   Severe Malnutrition related to chronic illness as evidenced by severe fat depletion, severe muscle depletion, energy intake < or equal to 50% for > or equal to 5 days.    GOAL:   Patient will meet greater than or equal to 90% of their needs    MONITOR:   PO intake, Supplement acceptance, Labs, Weight trends  REASON FOR ASSESSMENT:   Consult Assessment of nutrition requirement/status  ASSESSMENT:     82 y.o. F, Admitted with biliary obstruction.  Pmhx; hypertension, hyperlipidemia, hypothyroidism, dementia, CKD 2 CT and MRI/MRCP. Per MD, Noted on MRI to have an irregular appearing liver consistent with cirrhosis, splenomegaly, common bile duct to 10 mm in the porta hepatis proximally and there are several filling defects in the distal common bile duct measuring up to 9 mm. Blood cultures positive for E. Coli.  Patient alert distracted easily. Was unable to answer questions appropriately. No family members in room to get history on pt. There is no benefit to excessive dietary restrictions related to advanced age, declined oral intake and dx dementia. Pt would benefit from a regular diet.    Admit weight: 55 kg Current weight: 55 kg  Weight history: 04/01/23 55 kg  03/29/23 56.7 kg  03/20/23 60.8 kg  10/02/22 59 kg  07/17/22 60 kg  06/27/22 60.8 kg      Average Meal Intake: No documentation at this time.   Nutritionally Relevant Medications: Scheduled Meds:  feeding supplement  1 Container Oral TID BM   pantoprazole  40 mg Oral Daily    Continuous Infusions:  cefTRIAXone (ROCEPHIN)  IV       Labs Reviewed: Reviewed chem 7 labs CBG ranges from 63-178 mg/dL over the last 24 hours     NUTRITION - FOCUSED PHYSICAL EXAM:  Flowsheet Row Most Recent Value  Orbital Region Severe depletion  Upper  Arm Region Severe depletion  Thoracic and Lumbar Region Severe depletion  Buccal Region Severe depletion  Temple Region Severe depletion  Clavicle Bone Region Severe depletion  Clavicle and Acromion Bone Region Severe depletion  Scapular Bone Region Severe depletion  Dorsal Hand Severe depletion  Patellar Region Severe depletion  Anterior Thigh Region Severe depletion  Posterior Calf Region Severe depletion  Edema (RD Assessment) None  Hair Reviewed  Eyes Reviewed  Mouth Reviewed  Skin Reviewed  Nails Reviewed       Diet Order:   Diet Order             Diet regular Room service appropriate? No; Fluid consistency: Thin  Diet effective now                   EDUCATION NEEDS:   Not appropriate for education at this time  Skin:  Skin Assessment: Reviewed RN Assessment  Last BM:  PTA  Height:   Ht Readings from Last 1 Encounters:  03/29/23 5\' 3"  (1.6 m)    Weight:   Wt Readings from Last 1 Encounters:  04/01/23 55 kg    Ideal Body Weight:     BMI:  Body mass index is 21.48 kg/m.  Estimated Nutritional Needs:   Kcal:     Protein:     Fluid:       Jamelle Haring RDN, LDN Clinical Dietitian  RDN pager # available on Amion

## 2023-04-02 ENCOUNTER — Encounter (HOSPITAL_COMMUNITY): Payer: Self-pay | Admitting: Gastroenterology

## 2023-04-02 DIAGNOSIS — K831 Obstruction of bile duct: Secondary | ICD-10-CM | POA: Diagnosis not present

## 2023-04-02 LAB — COMPREHENSIVE METABOLIC PANEL
ALT: 56 U/L — ABNORMAL HIGH (ref 0–44)
AST: 34 U/L (ref 15–41)
Albumin: 2.8 g/dL — ABNORMAL LOW (ref 3.5–5.0)
Alkaline Phosphatase: 620 U/L — ABNORMAL HIGH (ref 38–126)
Anion gap: 10 (ref 5–15)
BUN: 8 mg/dL (ref 8–23)
CO2: 26 mmol/L (ref 22–32)
Calcium: 8.5 mg/dL — ABNORMAL LOW (ref 8.9–10.3)
Chloride: 103 mmol/L (ref 98–111)
Creatinine, Ser: 0.82 mg/dL (ref 0.44–1.00)
GFR, Estimated: >60 mL/min (ref >60)
Glucose, Bld: 96 mg/dL (ref 70–99)
Potassium: 2.9 mmol/L — ABNORMAL LOW (ref 3.5–5.1)
Sodium: 139 mmol/L (ref 135–145)
Total Bilirubin: 1.2 mg/dL (ref 0.3–1.2)
Total Protein: 5.5 g/dL — ABNORMAL LOW (ref 6.5–8.1)

## 2023-04-02 LAB — CBC WITH DIFFERENTIAL/PLATELET
Abs Immature Granulocytes: 0.02 10*3/uL (ref 0.00–0.07)
Basophils Absolute: 0 10*3/uL (ref 0.0–0.1)
Basophils Relative: 1 %
Eosinophils Absolute: 0.1 10*3/uL (ref 0.0–0.5)
Eosinophils Relative: 2 %
HCT: 31.3 % — ABNORMAL LOW (ref 36.0–46.0)
Hemoglobin: 10.2 g/dL — ABNORMAL LOW (ref 12.0–15.0)
Immature Granulocytes: 1 %
Lymphocytes Relative: 27 %
Lymphs Abs: 1.1 10*3/uL (ref 0.7–4.0)
MCH: 28.7 pg (ref 26.0–34.0)
MCHC: 32.6 g/dL (ref 30.0–36.0)
MCV: 88.2 fL (ref 80.0–100.0)
Monocytes Absolute: 0.4 10*3/uL (ref 0.1–1.0)
Monocytes Relative: 9 %
Neutro Abs: 2.5 10*3/uL (ref 1.7–7.7)
Neutrophils Relative %: 60 %
Platelets: 191 10*3/uL (ref 150–400)
RBC: 3.55 MIL/uL — ABNORMAL LOW (ref 3.87–5.11)
RDW: 14 % (ref 11.5–15.5)
WBC: 4.1 10*3/uL (ref 4.0–10.5)
nRBC: 0 % (ref 0.0–0.2)

## 2023-04-02 MED ORDER — POTASSIUM CHLORIDE CRYS ER 20 MEQ PO TBCR
40.0000 meq | EXTENDED_RELEASE_TABLET | ORAL | Status: AC
  Administered 2023-04-02 (×2): 40 meq via ORAL
  Filled 2023-04-02 (×2): qty 2

## 2023-04-02 MED ORDER — ONDANSETRON HCL 4 MG/2ML IJ SOLN
4.0000 mg | Freq: Four times a day (QID) | INTRAMUSCULAR | Status: DC | PRN
  Administered 2023-04-02: 4 mg via INTRAVENOUS
  Filled 2023-04-02: qty 2

## 2023-04-02 NOTE — Progress Notes (Signed)
Brief nutrition follow up;  RD consulted for assessment 04/02/2023, Pt was assessed 04/01/2023 with interventions placed. MD reached out to.  He did not know that nutrition was already following and good with yesterdays assessment. Jamelle Haring RDN, LDN Clinical Dietitian  RDN pager # available on Amion

## 2023-04-02 NOTE — Plan of Care (Signed)

## 2023-04-02 NOTE — Progress Notes (Signed)
PROGRESS NOTE    Angel Cameron  XNA:[id number] DOB: 24-Sep-1940 DOA: 03/30/2023 PCP: System, Provider Not In   Brief Narrative:  Angel Cameron is a 82 y.o. female with medical history significant of hypertension, hyperlipidemia, hypothyroidism, dementia, CKD 2 presented with altered mental status from facility.   In the ED, vital signs notable for fever to 103.1, otherwise hemodynamically stable.   AST 434, ALT 162, alk phos 997, T. bili 2.7.  Lactic acid initially elevated at 2.1 with repeat improved to normal.  Respiratory panel for flu COVID RSV negative.  Urinalysis with leukocytes and bacteria only.  Urine culture growing VRE but patient asymptomatic, no indication of treatment. Blood culture came back with E. coli without initial notable resistance in 3 out of 4 bottles.  Chest x-ray showed no acute normality.  CT head showed no acute normality.  Right upper quadrant ultrasound showed cholelithiasis with gallbladder sludge and dilated common bile duct with recommendation for MRCP.  MRCP showed choledocholithiasis, early cholecystitis, several stones at distal common bile duct, moderate distention of gallbladder with mild gallbladder wall thickening, changes consistent with early cirrhosis, evidence of portal hypertension.  Patient transferred from [facility] [place] to [facility] to accommodate ERCP.  Details below.  Assessment & Plan:   Principal Problem:   Biliary obstruction Active Problems:   Essential hypertension   Chronic kidney disease (CKD) stage G2/A1, mildly decreased glomerular filtration rate (GFR) between 60-89 mL/min/1.73 square meter and albuminuria creatinine ratio less than 30 mg/g   Hypothyroidism   Dementia without behavioral disturbance (HCC)   Hyperlipidemia   Bacteremia   Calculus of bile duct with acute cholangitis with obstruction   Elevated LFTs   Abnormal magnetic resonance cholangiopancreatography (MRCP)  Severe sepsis secondary to acute cholangitis and early  acute cholecystitis with choledocholithiasis/gram-negative bacteremia, hyperbilirubinemia/transaminitis, POA:  Patient met criteria for severe sepsis based on fever to 103.1, tachypnea, tachycardia, lactic acid of greater than 2 and acute encephalopathy.   Imaging including MRCP consistent with cholecystitis and choledocholithiasis with gallbladder sludge.  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 Blood cultures did show sensitive E. coli and 3 out of 4 bottles and out of Concern for possible cholangitis given presence of choledocholithiasis and bacteremia, patient was started on Rocephin and Flagyl, Flagyl was later discontinued by GI.  Underwent ERCP with sphincterotomy and balloon extraction of stones/sludge on 03/31/2023.  Continue Rocephin. Seen by general surgery.  They had discussed with the family.  Keeping in view, patient's old age and dementia, they have decided to be conservative and forego any cholecystectomy at the moment.  Patient can follow-up with general surgery as outpatient.  Patient's alkaline phosphatase and bilirubin is rising today.  Will observe for another night and trend labs.  If elevated, may need to consider another MRCP and may need to reach out to general surgery again as for the first time today, patient complained of not feeling well and she was tender on palpation at epigastric and mainly at right upper quadrant today. Per GI, No NSAIDs/ASA for 1 week after ERCP with sphincterotomy    Hypertension - Holding home antihypertensives with borderline low blood pressure in the setting of developing infection   Hyperlipidemia -Hold atorvastatin for now with elevated LFTs.   Hypothyroidism -Continue Synthroid.   Advanced dementia with patient residing in memory care: -Typically alert and oriented to self and sometimes place at baseline.  Resumed donepezil, Namenda, fluoxetine, trazodone.   Anemia of chronic disease: Baseline around 10, currently around  baseline.   CKD 2 ruled out.  Patient does not have CKD.  Moderate protein calorie malnutrition: Hypoalbuminemia.  Dietitian consulted.  Hypokalemia: Will replenish.  DVT prophylaxis: SCDs Start: 03/30/23 1559   Code Status: Full Code  Family Communication: None present at bedside.   Status is: Inpatient Remains inpatient appropriate because: Will observe overnight with elevated LFTs.  Estimated body mass index is 21.48 kg/m as calculated from the following:   Height as of 03/29/23: 5\' 3"  (1.6 m).   Weight as of this encounter: 55 kg.    Nutritional Assessment: Body mass index is 21.48 kg/m.Marland Kitchen Seen by dietician.  I agree with the assessment and plan as outlined below: Nutrition Status: Nutrition Problem: Severe Malnutrition Etiology: chronic illness Signs/Symptoms: severe fat depletion, severe muscle depletion, energy intake < or equal to 50% for > or equal to 5 days Interventions: Ensure Enlive (each supplement provides 350kcal and 20 grams of protein), Liberalize Diet  . Skin Assessment: I have examined the patient's skin and I agree with the wound assessment as performed by the wound care RN as outlined below:    Consultants:  GI  General Surgery-signed off  Procedures:  Above  Antimicrobials:  Anti-infectives (From admission, onward)    Start     Dose/Rate Route Frequency Ordered Stop   04/01/23 1800  cefTRIAXone (ROCEPHIN) 2 g in sodium chloride 0.9 % 100 mL IVPB        2 g 200 mL/hr over 30 Minutes Intravenous Every 24 hours 03/31/23 2010 04/07/23 1759   03/31/23 2200  metroNIDAZOLE (FLAGYL) IVPB 500 mg  Status:  Discontinued        500 mg 100 mL/hr over 60 Minutes Intravenous Every 12 hours 03/31/23 2008 04/01/23 1051   03/30/23 1800  cefTRIAXone (ROCEPHIN) 2 g in sodium chloride 0.9 % 100 mL IVPB  Status:  Discontinued        2 g 200 mL/hr over 30 Minutes Intravenous Every 24 hours 03/30/23 1549 03/31/23 2010   03/30/23 1645  metroNIDAZOLE (FLAGYL) IVPB  500 mg  Status:  Discontinued        500 mg 100 mL/hr over 60 Minutes Intravenous Every 12 hours 03/30/23 1549 03/30/23 1700         Subjective: Patient seen and examined.  For the first time today, she complained of not feeling well and was complaining of abdominal pain.  Objective: Vitals:   04/01/23 1150 04/01/23 1559 04/01/23 1947 04/02/23 0414  BP: 139/65 (!) 143/71 (!) 164/58 (!) 151/55  Pulse: 89 84 77 69  Resp: 19 18  20   Temp: 97.8 F (36.6 C) (!) 97.5 F (36.4 C) 97.6 F (36.4 C) 97.6 F (36.4 C)  TempSrc: Oral Oral Oral Oral  SpO2: 96% 97% 96% 98%  Weight:        Intake/Output Summary (Last 24 hours) at 04/02/2023 0736 Last data filed at 04/01/2023 1422 Gross per 24 hour  Intake 237 ml  Output --  Net 237 ml   Filed Weights   03/30/23 1524 04/01/23 0500  Weight: 52 kg 55 kg    Examination:  General exam: Appears calm and comfortable  Respiratory system: Clear to auscultation. Respiratory effort normal. Cardiovascular system: S1 & S2 heard, RRR. No JVD, murmurs, rubs, gallops or clicks. No pedal edema. Gastrointestinal system: Abdomen is nondistended, soft but TTP epigastric and right upper quadrant area. No organomegaly or masses felt. Normal bowel sounds heard. Central nervous system: Alert and oriented x 1. No focal neurological deficits.  Extremities: Symmetric 5 x 5 power. Skin: No rashes, lesions or ulcers.    Data Reviewed: I have personally reviewed following labs and imaging studies  CBC: Recent Labs  Lab 03/29/23 1746 03/30/23 1618 03/31/23 0458 04/01/23 0655 04/02/23 0507  WBC 7.5 4.8 4.6 5.0 4.1  NEUTROABS 6.5  --   --  3.5 2.5  HGB 10.2* 8.8* 8.7* 8.4* 10.2*  HCT 32.0* 27.5* 27.5* 25.9* 31.3*  MCV 89.1 86.8 87.0 89.6 88.2  PLT 201 175 166 168 191   Basic Metabolic Panel: Recent Labs  Lab 03/29/23 1746 03/30/23 1618 03/31/23 0458 04/01/23 0655 04/02/23 0507  NA 136 140 139 136 139  K 4.2 3.4* 3.6 3.5 2.9*  CL 100 100  101 104 103  CO2 27 30 26 24 26   GLUCOSE 212* 83 63* 178* 96  BUN 24* 13 12 14 8   CREATININE 0.76 0.92 0.83 0.84 0.82  CALCIUM 8.7* 8.8* 8.4* 8.1* 8.5*   GFR: Estimated Creatinine Clearance: 42.2 mL/min (by C-G formula based on SCr of 0.82 mg/dL). Liver Function Tests: Recent Labs  Lab 03/29/23 1746 03/30/23 1618 03/31/23 0458 04/01/23 0655 04/02/23 0507  AST 434* 138* 89* 40 34  ALT 162* 109* 87* 60* 56*  ALKPHOS 997* 747* 595* 543* 620*  BILITOT 2.7* 1.6* 1.4* 0.7 1.2  PROT 6.4* 5.1* 5.1* 4.8* 5.5*  ALBUMIN 3.3* 2.7* 2.5* 2.4* 2.8*   No results for input(s): "LIPASE", "AMYLASE" in the last 168 hours. No results for input(s): "AMMONIA" in the last 168 hours. Coagulation Profile: Recent Labs  Lab 03/29/23 1746  INR 1.1   Cardiac Enzymes: No results for input(s): "CKTOTAL", "CKMB", "CKMBINDEX", "TROPONINI" in the last 168 hours. BNP (last 3 results) No results for input(s): "PROBNP" in the last 8760 hours. HbA1C: No results for input(s): "HGBA1C" in the last 72 hours. CBG: No results for input(s): "GLUCAP" in the last 168 hours. Lipid Profile: No results for input(s): "CHOL", "HDL", "LDLCALC", "TRIG", "CHOLHDL", "LDLDIRECT" in the last 72 hours. Thyroid Function Tests: No results for input(s): "TSH", "T4TOTAL", "FREET4", "T3FREE", "THYROIDAB" in the last 72 hours. Anemia Panel: No results for input(s): "VITAMINB12", "FOLATE", "FERRITIN", "TIBC", "IRON", "RETICCTPCT" in the last 72 hours. Sepsis Labs: Recent Labs  Lab 03/29/23 1746 03/30/23 0157  LATICACIDVEN 2.1* 1.2    Recent Results (from the past 240 hour(s))  Resp panel by RT-PCR (RSV, Flu A&B, Covid) Anterior Nasal Swab     Status: None   Collection Time: 03/29/23  5:45 PM   Specimen: Anterior Nasal Swab  Result Value Ref Range Status   SARS Coronavirus 2 by RT PCR NEGATIVE NEGATIVE Final    Comment: (NOTE) SARS-CoV-2 target nucleic acids are NOT DETECTED.  The SARS-CoV-2 RNA is generally detectable  in upper respiratory specimens during the acute phase of infection. The lowest concentration of SARS-CoV-2 viral copies this assay can detect is 138 copies/mL. A negative result does not preclude SARS-Cov-2 infection and should not be used as the sole basis for treatment or other patient management decisions. A negative result may occur with  improper specimen collection/handling, submission of specimen other than nasopharyngeal swab, presence of viral mutation(s) within the areas targeted by this assay, and inadequate number of viral copies(<138 copies/mL). A negative result must be combined with clinical observations, patient history, and epidemiological information. The expected result is Negative.  Fact Sheet for Patients:  BloggerCourse.com  Fact Sheet for Healthcare Providers:  SeriousBroker.it  This test is no t yet approved or cleared by the  [place] [employer] and  has been authorized for detection and/or diagnosis of SARS-CoV-2 by FDA under an TEFL teacher (EUA). This EUA will remain  in effect (meaning this test can be used) for the duration of the COVID-19 declaration under Section 564(b)(1) of the Act, 21 U.S.C.section 360bbb-3(b)(1), unless the authorization is terminated  or revoked sooner.       Influenza A by PCR NEGATIVE NEGATIVE Final   Influenza B by PCR NEGATIVE NEGATIVE Final    Comment: (NOTE) The Xpert Xpress SARS-CoV-2/FLU/RSV plus assay is intended as an aid in the diagnosis of influenza from Nasopharyngeal swab specimens and should not be used as a sole basis for treatment. Nasal washings and aspirates are unacceptable for Xpert Xpress SARS-CoV-2/FLU/RSV testing.  Fact Sheet for Patients: BloggerCourse.com  Fact Sheet for Healthcare Providers: SeriousBroker.it  This test is not yet approved or cleared by the [place] FDA and has  been authorized for detection and/or diagnosis of SARS-CoV-2 by FDA under an Emergency Use Authorization (EUA). This EUA will remain in effect (meaning this test can be used) for the duration of the COVID-19 declaration under Section 564(b)(1) of the Act, 21 U.S.C. section 360bbb-3(b)(1), unless the authorization is terminated or revoked.     Resp Syncytial Virus by PCR NEGATIVE NEGATIVE Final    Comment: (NOTE) Fact Sheet for Patients: BloggerCourse.com  Fact Sheet for Healthcare Providers: SeriousBroker.it  This test is not yet approved or cleared by the [place] FDA and has been authorized for detection and/or diagnosis of SARS-CoV-2 by FDA under an Emergency Use Authorization (EUA). This EUA will remain in effect (meaning this test can be used) for the duration of the COVID-19 declaration under Section 564(b)(1) of the Act, 21 U.S.C. section 360bbb-3(b)(1), unless the authorization is terminated or revoked.  Performed at [facility], [address] [place], [place] [address]   Culture, blood (Routine x 2)     Status: Abnormal   Collection Time: 03/29/23  5:46 PM   Specimen: BLOOD  Result Value Ref Range Status   Specimen Description   Final    BLOOD RIGHT ANTECUBITAL Performed at [facility], [address] [place], [place] [address]    Special Requests   Final    BOTTLES DRAWN AEROBIC AND ANAEROBIC Blood Culture adequate volume Performed at [facility], [address] [place], [place] [address]    Culture  Setup Time   Final    GRAM NEGATIVE RODS IN BOTH AEROBIC AND ANAEROBIC BOTTLES CRITICAL RESULT CALLED TO, READ BACK BY AND VERIFIED WITH: JASON ROBBINS AT 0630 03/30/23.PMF    Culture ESCHERICHIA COLI (A)  Final   Report Status 04/01/2023 FINAL  Final   Organism ID, Bacteria ESCHERICHIA COLI  Final      Susceptibility   Escherichia coli - MIC*    AMPICILLIN 4 SENSITIVE  Sensitive     CEFEPIME <=0.12 SENSITIVE Sensitive     CEFTAZIDIME <=1 SENSITIVE Sensitive     CEFTRIAXONE <=0.25 SENSITIVE Sensitive     CIPROFLOXACIN <=0.25 SENSITIVE Sensitive     GENTAMICIN <=1 SENSITIVE Sensitive     IMIPENEM <=0.25 SENSITIVE Sensitive     TRIMETH/SULFA <=20 SENSITIVE Sensitive     AMPICILLIN/SULBACTAM <=2 SENSITIVE Sensitive     PIP/TAZO <=4 SENSITIVE Sensitive ug/mL    * ESCHERICHIA COLI  Blood Culture ID Panel (Reflexed)     Status: Abnormal   Collection Time: 03/29/23  5:46 PM  Result Value Ref Range Status   Enterococcus  faecalis NOT DETECTED NOT DETECTED Final   Enterococcus Faecium NOT DETECTED NOT DETECTED Final   Listeria monocytogenes NOT DETECTED NOT DETECTED Final   Staphylococcus species NOT DETECTED NOT DETECTED Final   Staphylococcus aureus (BCID) NOT DETECTED NOT DETECTED Final   Staphylococcus epidermidis NOT DETECTED NOT DETECTED Final   Staphylococcus lugdunensis NOT DETECTED NOT DETECTED Final   Streptococcus species NOT DETECTED NOT DETECTED Final   Streptococcus agalactiae NOT DETECTED NOT DETECTED Final   Streptococcus pneumoniae NOT DETECTED NOT DETECTED Final   Streptococcus pyogenes NOT DETECTED NOT DETECTED Final   A.calcoaceticus-baumannii NOT DETECTED NOT DETECTED Final   Bacteroides fragilis NOT DETECTED NOT DETECTED Final   Enterobacterales DETECTED (A) NOT DETECTED Final    Comment: Enterobacterales represent a large order of gram negative bacteria, not a single organism. CRITICAL RESULT CALLED TO, READ BACK BY AND VERIFIED WITH: JASON ROBBINS AT 0630 03/30/23.PMF    Enterobacter cloacae complex NOT DETECTED NOT DETECTED Final   Escherichia coli DETECTED (A) NOT DETECTED Final    Comment: CRITICAL RESULT CALLED TO, READ BACK BY AND VERIFIED WITH: JASON ROBBINS AT 0630 03/30/23.PMF    Klebsiella aerogenes NOT DETECTED NOT DETECTED Final   Klebsiella oxytoca NOT DETECTED NOT DETECTED Final   Klebsiella pneumoniae NOT DETECTED  NOT DETECTED Final   Proteus species NOT DETECTED NOT DETECTED Final   Salmonella species NOT DETECTED NOT DETECTED Final   Serratia marcescens NOT DETECTED NOT DETECTED Final   Haemophilus influenzae NOT DETECTED NOT DETECTED Final   Neisseria meningitidis NOT DETECTED NOT DETECTED Final   Pseudomonas aeruginosa NOT DETECTED NOT DETECTED Final   Stenotrophomonas maltophilia NOT DETECTED NOT DETECTED Final   Candida albicans NOT DETECTED NOT DETECTED Final   Candida auris NOT DETECTED NOT DETECTED Final   Candida glabrata NOT DETECTED NOT DETECTED Final   Candida krusei NOT DETECTED NOT DETECTED Final   Candida parapsilosis NOT DETECTED NOT DETECTED Final   Candida tropicalis NOT DETECTED NOT DETECTED Final   Cryptococcus neoformans/gattii NOT DETECTED NOT DETECTED Final   CTX-M ESBL NOT DETECTED NOT DETECTED Final   Carbapenem resistance IMP NOT DETECTED NOT DETECTED Final   Carbapenem resistance KPC NOT DETECTED NOT DETECTED Final   Carbapenem resistance NDM NOT DETECTED NOT DETECTED Final   Carbapenem resist OXA 48 LIKE NOT DETECTED NOT DETECTED Final   Carbapenem resistance VIM NOT DETECTED NOT DETECTED Final    Comment: Performed at [facility], [address] [place], [place] [address]  Culture, blood (Routine x 2)     Status: Abnormal   Collection Time: 03/29/23  6:45 PM   Specimen: BLOOD  Result Value Ref Range Status   Specimen Description   Final    BLOOD BLOOD LEFT ARM Performed at [facility], [address], [place], [place] [address]    Special Requests   Final    BOTTLES DRAWN AEROBIC AND ANAEROBIC Blood Culture adequate volume Performed at [facility], [address] [place], [place] [address]    Culture  Setup Time   Final    GRAM NEGATIVE RODS IN BOTH AEROBIC AND ANAEROBIC BOTTLES CRITICAL RESULT CALLED TO, READ BACK BY AND VERIFIED WITH: JASON ROBBINS AT 0630 03/30/23.PMF    Culture (A)  Final    ESCHERICHIA  COLI SUSCEPTIBILITIES PERFORMED ON PREVIOUS CULTURE WITHIN THE LAST 5 DAYS. Performed at [facility], [address], [place], [place] [address]    Report Status 04/01/2023 FINAL  Final  Urine Culture  Status: Abnormal   Collection Time: 03/29/23  6:45 PM   Specimen: Urine, Random  Result Value Ref Range Status   Specimen Description   Final    URINE, RANDOM Performed at [facility], [address] [place], [place] [address]    Special Requests   Final    NONE Reflexed from [id number] Performed at [facility], [address] [place], [place] [address]    Culture (A)  Final    >=100,000 COLONIES/mL ENTEROCOCCUS FAECIUM VANCOMYCIN RESISTANT ENTEROCOCCUS ISOLATED    Report Status 04/01/2023 FINAL  Final   Organism ID, Bacteria ENTEROCOCCUS FAECIUM (A)  Final      Susceptibility   Enterococcus faecium - MIC*    AMPICILLIN >=32 RESISTANT Resistant     NITROFURANTOIN 32 SENSITIVE Sensitive     VANCOMYCIN >=32 RESISTANT Resistant     GENTAMICIN SYNERGY SENSITIVE Sensitive     LINEZOLID 2 SENSITIVE Sensitive     * >=100,000 COLONIES/mL ENTEROCOCCUS FAECIUM  Culture, blood (Routine X 2) w Reflex to ID Panel     Status: None (Preliminary result)   Collection Time: 03/30/23 10:49 PM   Specimen: BLOOD  Result Value Ref Range Status   Specimen Description BLOOD SITE NOT SPECIFIED  Final   Special Requests AEROBIC BOTTLE ONLY Blood Culture adequate volume  Final   Culture   Final    NO GROWTH 2 DAYS Performed at [facility], [address], [place], [place] [address]    Report Status PENDING  Incomplete  Culture, blood (Routine X 2) w Reflex to ID Panel     Status: None (Preliminary result)   Collection Time: 03/30/23 10:49 PM   Specimen: BLOOD  Result Value Ref Range Status   Specimen Description BLOOD BLOOD RIGHT HAND  Final   Special Requests AEROBIC BOTTLE ONLY Blood Culture adequate volume  Final   Culture   Final    NO GROWTH 2  DAYS Performed at [facility], [address], [place], [place] [address]    Report Status PENDING  Incomplete     Radiology Studies: DG ERCP  Result Date: 03/31/2023 CLINICAL DATA:  Choledocholithiasis EXAM: ERCP TECHNIQUE: Multiple spot images obtained with the fluoroscopic device and submitted for interpretation post-procedure. COMPARISON:  MRCP from previous day FINDINGS: A series of fluoroscopic spot images document endoscopic cannulation and opacification of the CBD with balloon catheter passage through the CBD. The intrahepatic ducts are incompletely visualized appearing relatively decompressed centrally. No extravasation. IMPRESSION: Endoscopic CBD cannulation and intervention. Electronically Signed   By: Corlis Leak M.D.   On: 03/31/2023 15:04    Scheduled Meds:  atorvastatin  10 mg Oral Daily   diclofenac  100 mg Rectal Once   docusate sodium  100 mg Oral BID   donepezil  5 mg Oral QHS   feeding supplement  1 Container Oral TID BM   FLUoxetine  10 mg Oral Daily   levothyroxine  75 mcg Oral QAC breakfast   memantine  5 mg Oral Daily   pantoprazole  40 mg Oral Daily   potassium chloride  40 mEq Oral Q4H   sodium chloride flush  3 mL Intravenous Q12H   sodium chloride flush  3 mL Intravenous Q12H   Continuous Infusions:  cefTRIAXone (ROCEPHIN)  IV Stopped (04/01/23 1950)     LOS: 3 days   Hughie Closs, MD [facility]  04/02/2023, 7:36 AM   *Please note that this is a verbal dictation therefore any spelling  or grammatical errors are due to the "Dragon Medical One" system interpretation.  Please page via Amion and do not message via secure chat for urgent patient care matters. Secure chat can be used for non urgent patient care matters.  How to contact the [facility] [employer] Attending or Consulting provider 7A - 7P or covering provider during after hours 7P -7A, for this patient?  Check the care team in [employer] and look for a) attending/consulting [facility] provider listed and b)  the [facility] team listed. Page or secure chat 7A-7P. Log into [email or web address] and use [facility]'s universal password to access. If you do not have the password, please contact the hospital operator. Locate the [facility] provider you are looking for under [facility] and page to a number that you can be directly reached. If you still have difficulty reaching the provider, please page the [facility] (Director on Call) for the Hospitalists listed on amion for assistance.

## 2023-04-02 NOTE — Progress Notes (Signed)
  Patient is complaining about nausea - Ordered Zofran as needed.   Tereasa Coop, MD [facility] 04/02/2023, 7:35 PM

## 2023-04-02 NOTE — Progress Notes (Signed)
No plans for cholecystectomy and patient's family does not desire surgery. General surgery available as needed.   Juliet Rude, [facility] 04/02/2023, 2:32 PM Please see Amion for pager number during day hours 7:00am-4:30pm

## 2023-04-03 DIAGNOSIS — K831 Obstruction of bile duct: Secondary | ICD-10-CM | POA: Diagnosis not present

## 2023-04-03 LAB — COMPREHENSIVE METABOLIC PANEL
ALT: 43 U/L (ref 0–44)
AST: 27 U/L (ref 15–41)
Albumin: 2.6 g/dL — ABNORMAL LOW (ref 3.5–5.0)
Alkaline Phosphatase: 554 U/L — ABNORMAL HIGH (ref 38–126)
Anion gap: 8 (ref 5–15)
BUN: 6 mg/dL — ABNORMAL LOW (ref 8–23)
CO2: 30 mmol/L (ref 22–32)
Calcium: 8.9 mg/dL (ref 8.9–10.3)
Chloride: 101 mmol/L (ref 98–111)
Creatinine, Ser: 0.78 mg/dL (ref 0.44–1.00)
GFR, Estimated: >60 mL/min (ref >60)
Glucose, Bld: 130 mg/dL — ABNORMAL HIGH (ref 70–99)
Potassium: 3.9 mmol/L (ref 3.5–5.1)
Sodium: 139 mmol/L (ref 135–145)
Total Bilirubin: 0.9 mg/dL (ref 0.3–1.2)
Total Protein: 5 g/dL — ABNORMAL LOW (ref 6.5–8.1)

## 2023-04-03 MED ORDER — AMOXICILLIN 500 MG PO CAPS
500.0000 mg | ORAL_CAPSULE | Freq: Three times a day (TID) | ORAL | Status: AC
Start: 2023-04-03 — End: 2023-04-08

## 2023-04-03 NOTE — Progress Notes (Signed)
This RN called and spoke with daughter Velna Hatchet to let her know transport is here to take patient to [facility]. Patient left unit with all belongings in stable condition via [facility] transport.

## 2023-04-03 NOTE — Progress Notes (Signed)
This RN attempted to call report to [facility] at phone number provided. RN connected to memory care. Automated system stated "user unavailable and unable to take messages at this time" then phone line disconnected.

## 2023-04-03 NOTE — Plan of Care (Signed)
  Problem: Safety: Goal: Ability to remain free from injury will improve Outcome: Progressing   

## 2023-04-03 NOTE — Plan of Care (Signed)
Problem: Education: Goal: Knowledge of General Education information will improve Description: Including pain rating scale, medication(s)/side effects and non-pharmacologic comfort measures 04/03/2023 1110 by Doyce Para, RN Outcome: Adequate for Discharge 04/03/2023 1110 by Doyce Para, RN Outcome: Adequate for Discharge   Problem: Health Behavior/Discharge Planning: Goal: Ability to manage health-related needs will improve 04/03/2023 1110 by Doyce Para, RN Outcome: Adequate for Discharge 04/03/2023 1110 by Doyce Para, RN Outcome: Adequate for Discharge   Problem: Clinical Measurements: Goal: Ability to maintain clinical measurements within normal limits will improve 04/03/2023 1110 by Doyce Para, RN Outcome: Adequate for Discharge 04/03/2023 1110 by Doyce Para, RN Outcome: Adequate for Discharge Goal: Will remain free from infection 04/03/2023 1110 by Doyce Para, RN Outcome: Adequate for Discharge 04/03/2023 1110 by Doyce Para, RN Outcome: Adequate for Discharge Goal: Diagnostic test results will improve 04/03/2023 1110 by Doyce Para, RN Outcome: Adequate for Discharge 04/03/2023 1110 by Doyce Para, RN Outcome: Adequate for Discharge Goal: Respiratory complications will improve 04/03/2023 1110 by Doyce Para, RN Outcome: Adequate for Discharge 04/03/2023 1110 by Doyce Para, RN Outcome: Adequate for Discharge Goal: Cardiovascular complication will be avoided 04/03/2023 1110 by Doyce Para, RN Outcome: Adequate for Discharge 04/03/2023 1110 by Doyce Para, RN Outcome: Adequate for Discharge   Problem: Activity: Goal: Risk for activity intolerance will decrease 04/03/2023 1110 by Doyce Para, RN Outcome: Adequate for Discharge 04/03/2023 1110 by Doyce Para, RN Outcome: Adequate for Discharge   Problem: Nutrition: Goal: Adequate  nutrition will be maintained 04/03/2023 1110 by Doyce Para, RN Outcome: Adequate for Discharge 04/03/2023 1110 by Doyce Para, RN Outcome: Adequate for Discharge   Problem: Coping: Goal: Level of anxiety will decrease 04/03/2023 1110 by Doyce Para, RN Outcome: Adequate for Discharge 04/03/2023 1110 by Doyce Para, RN Outcome: Adequate for Discharge   Problem: Elimination: Goal: Will not experience complications related to bowel motility 04/03/2023 1110 by Doyce Para, RN Outcome: Adequate for Discharge 04/03/2023 1110 by Doyce Para, RN Outcome: Adequate for Discharge Goal: Will not experience complications related to urinary retention 04/03/2023 1110 by Doyce Para, RN Outcome: Adequate for Discharge 04/03/2023 1110 by Doyce Para, RN Outcome: Adequate for Discharge   Problem: Pain Managment: Goal: General experience of comfort will improve 04/03/2023 1110 by Doyce Para, RN Outcome: Adequate for Discharge 04/03/2023 1110 by Doyce Para, RN Outcome: Adequate for Discharge   Problem: Safety: Goal: Ability to remain free from injury will improve 04/03/2023 1110 by Doyce Para, RN Outcome: Adequate for Discharge 04/03/2023 1110 by Doyce Para, RN Outcome: Progressing   Problem: Skin Integrity: Goal: Risk for impaired skin integrity will decrease 04/03/2023 1110 by Doyce Para, RN Outcome: Adequate for Discharge 04/03/2023 1110 by Doyce Para, RN Outcome: Adequate for Discharge   Problem: Education: Goal: Verbalization of understanding the information provided (i.e., activity precautions, restrictions, etc) will improve 04/03/2023 1110 by Doyce Para, RN Outcome: Adequate for Discharge 04/03/2023 1110 by Doyce Para, RN Outcome: Adequate for Discharge Goal: Individualized Educational Video(s) 04/03/2023 1110 by Doyce Para,  RN Outcome: Adequate for Discharge 04/03/2023 1110 by Doyce Para, RN Outcome: Adequate for Discharge   Problem: Activity: Goal: Ability to ambulate and perform ADLs will improve 04/03/2023 1110 by Doyce Para, RN Outcome: Adequate for Discharge 04/03/2023 1110 by Doyce Para, RN Outcome: Adequate for Discharge   Problem: Clinical Measurements: Goal: Postoperative complications will be avoided or minimized 04/03/2023 1110 by Doyce Para, RN Outcome: Adequate for Discharge 04/03/2023 1110 by Doyce Para, RN Outcome: Adequate for Discharge   Problem: Self-Concept: Goal: Ability to  maintain and perform role responsibilities to the fullest extent possible will improve 04/03/2023 1110 by Doyce Para, RN Outcome: Adequate for Discharge 04/03/2023 1110 by Doyce Para, RN Outcome: Adequate for Discharge   Problem: Pain Management: Goal: Pain level will decrease 04/03/2023 1110 by Doyce Para, RN Outcome: Adequate for Discharge 04/03/2023 1110 by Doyce Para, RN Outcome: Adequate for Discharge

## 2023-04-03 NOTE — Progress Notes (Signed)
RN attempted to call report again to [facility]. Connected to memory care extension and automated system again stated "user unavailable and unable to take message at this time" then phone disconnected.

## 2023-04-03 NOTE — Discharge Summary (Signed)
Angel Cameron AVW:[id number] DOB: 08-May-1941 DOA: 03/30/2023  PCP: System, Provider Not In  Admit date: 03/30/2023 Discharge date: 04/03/2023  Time spent: 35 minutes  Recommendations for Outpatient Follow-up:  Consider gen surg f/u (Dr. Sheliah Hatch, need to call to schedule)    Discharge Diagnoses:  Principal Problem:   Biliary obstruction Active Problems:   Essential hypertension   Chronic kidney disease (CKD) stage G2/A1, mildly decreased glomerular filtration rate (GFR) between 60-89 mL/min/1.73 square meter and albuminuria creatinine ratio less than 30 mg/g   Hypothyroidism   Dementia without behavioral disturbance (HCC)   Hyperlipidemia   Bacteremia   Calculus of bile duct with acute cholangitis with obstruction   Elevated LFTs   Abnormal magnetic resonance cholangiopancreatography (MRCP)   Discharge Condition: stable  Diet recommendation: regular  Filed Weights   03/30/23 1524 04/01/23 0500  Weight: 52 kg 55 kg    History of present illness:  From admission h and p Angel Cameron is a 82 y.o. female with medical history significant of hypertension, hyperlipidemia, hypothyroidism, dementia, CKD 2 presenting with altered mental status from facility.   History obtained with assistance of chart review and family.  Patient reportedly was near baseline per staff in the morning of 10/11 but later developed worsening confusion.  Has been found to be febrile as well.  Patient has baseline dementia but has been more confused than baseline per family.  Hospital Course:  Severe sepsis secondary to acute cholangitis and early acute cholecystitis with choledocholithiasis/gram-negative bacteremia, hyperbilirubinemia/transaminitis, POA:  Patient met criteria for severe sepsis based on fever to 103.1, tachypnea, tachycardia, lactic acid of greater than 2 and acute encephalopathy.   Imaging including MRCP consistent with cholecystitis and choledocholithiasis with gallbladder  sludge.  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 Blood cultures did show sensitive E. coli and 3 out of 4 bottles and out of Concern for possible cholangitis given presence of choledocholithiasis and bacteremia, patient was started on Rocephin and Flagyl, Flagyl was later discontinued by GI.  Underwent ERCP with sphincterotomy and balloon extraction of stones/sludge on 03/31/2023.  Continue Rocephin. Seen by general surgery.  They had discussed with the family.  Keeping in view, patient's old age and dementia, they have decided to be conservative and forego any cholecystectomy at the moment.  Patient can follow-up with general surgery as outpatient.  Vitals stable, labs much improved, and tolerating diet. Will discharge on oral amoxicillin to complete a 10 day course Per GI, No NSAIDs/ASA for 1 week after ERCP with sphincterotomy    Hypertension - Holding home antihypertensives with borderline low blood pressure in the setting of above process, can re-start as needed   Hyperlipidemia -LFTs normalized, can resume statin   Hypothyroidism -Continue Synthroid.   Advanced dementia with patient residing in memory care: -Typically alert and oriented to self and sometimes place at baseline, spends most of her time in bed or in chair. Resumed donepezil, Namenda, fluoxetine, trazodone.   Anemia of chronic disease: Baseline around 10, currently around baseline.     Procedures: Ercp    Consultations: Gi, gen surg  Discharge Exam: Vitals:   04/03/23 0546 04/03/23 0804  BP: (!) 135/52 (!) 122/58  Pulse: 66 75  Resp: 16 17  Temp: 97.7 F (36.5 C) 98.4 F (36.9 C)  SpO2: 97% 97%    General: NAD, frail Cardiovascular: RRR Respiratory: CTAB Abdomen: soft, non-tender  Discharge Instructions   Discharge Instructions     Diet general   Complete by: As directed  Increase activity slowly   Complete by: As directed       Allergies as of 04/03/2023   No Known  Allergies      Medication List     STOP taking these medications    benazepril 20 MG tablet Commonly known as: LOTENSIN   metoprolol succinate 50 MG 24 hr tablet Commonly known as: TOPROL-XL       TAKE these medications    (feeding supplement) PROSource Plus liquid Take 30 mLs by mouth 2 (two) times daily between meals.   acetaminophen 500 MG tablet Commonly known as: TYLENOL Take 1,000 mg by mouth in the morning, at noon, and at bedtime.   amoxicillin 500 MG capsule Commonly known as: AMOXIL Take 1 capsule (500 mg total) by mouth 3 (three) times daily for 5 days.   atorvastatin 10 MG tablet Commonly known as: LIPITOR Take 10 mg by mouth daily.   bisacodyl 10 MG suppository Commonly known as: DULCOLAX Place 1 suppository (10 mg total) rectally daily as needed for moderate constipation.   docusate sodium 100 MG capsule Commonly known as: COLACE Take 1 capsule (100 mg total) by mouth 2 (two) times daily.   donepezil 5 MG tablet Commonly known as: ARICEPT Take 5 mg by mouth at bedtime.   enoxaparin 40 MG/0.4ML injection Commonly known as: LOVENOX Inject 0.4 mLs (40 mg total) into the skin daily for 21 days.   FLUoxetine 10 MG capsule Commonly known as: PROZAC Take 10 mg by mouth daily.   HYDROcodone-acetaminophen 5-325 MG tablet Commonly known as: NORCO/VICODIN Take 1-2 tablets by mouth every 4 (four) hours as needed for moderate pain (pain score 4-6).   levothyroxine 75 MCG tablet Commonly known as: SYNTHROID Take 75 mcg by mouth daily before breakfast.   meclizine 25 MG tablet Commonly known as: ANTIVERT Take 0.5 tablets (12.5 mg total) by mouth 2 (two) times daily as needed for dizziness.   memantine 5 MG tablet Commonly known as: NAMENDA Take 5 mg by mouth daily.   omeprazole 20 MG capsule Commonly known as: PRILOSEC Take 20 mg by mouth daily as needed (for heartburn).   T.E.D. Knee Length/M-Regular Misc   traMADol 50 MG tablet Commonly  known as: ULTRAM Take 50 mg by mouth in the morning, at noon, and at bedtime.   traZODone 50 MG tablet Commonly known as: DESYREL Take 1 tablet (50 mg total) by mouth at bedtime as needed for sleep.       No Known Allergies    The results of significant diagnostics from this hospitalization (including imaging, microbiology, ancillary and laboratory) are listed below for reference.    Significant Diagnostic Studies: DG ERCP  Result Date: 03/31/2023 CLINICAL DATA:  Choledocholithiasis EXAM: ERCP TECHNIQUE: Multiple spot images obtained with the fluoroscopic device and submitted for interpretation post-procedure. COMPARISON:  MRCP from previous day FINDINGS: A series of fluoroscopic spot images document endoscopic cannulation and opacification of the CBD with balloon catheter passage through the CBD. The intrahepatic ducts are incompletely visualized appearing relatively decompressed centrally. No extravasation. IMPRESSION: Endoscopic CBD cannulation and intervention. Electronically Signed   By: Corlis Leak M.D.   On: 03/31/2023 15:04   MR ABDOMEN MRCP W WO CONTAST  Result Date: 03/30/2023 CLINICAL DATA:  82 year old female history of cholelithiasis and possible tumefactive sludge in the gallbladder lumen. EXAM: MRI ABDOMEN WITHOUT AND WITH CONTRAST (INCLUDING MRCP) TECHNIQUE: Multiplanar multisequence MR imaging of the abdomen was performed both before and after the administration of intravenous contrast. Heavily T2-weighted  images of the biliary and pancreatic ducts were obtained, and three-dimensional MRCP images were rendered by post processing. CONTRAST:  6mL GADAVIST GADOBUTROL 1 MMOL/ML IV SOLN COMPARISON:  No prior abdominal MRI. Abdominal ultrasound 03/29/2023. FINDINGS: Lower chest: Unremarkable. Hepatobiliary: Liver contour is mildly irregular, suggesting a background of cirrhosis. No suspicious cystic or solid hepatic lesions. Mild intrahepatic biliary ductal dilatation. Common bile  duct is also dilated measuring up to 10 mm in the porta hepatis proximally. Several filling defects are noted in the distal common bile duct, measuring up to 9 mm. Cystic duct is not well visualized, but small filling defects in the cystic duct suggest probable stones in this cystic duct. Small filling defects are also noted lying dependently in the gallbladder, indicative of cholelithiasis. Gallbladder is moderately distended. Gallbladder wall appears mildly thickened and edematous. Pancreas: No pancreatic mass. No pancreatic ductal dilatation. No pancreatic or peripancreatic fluid collections or inflammatory changes. Spleen: Spleen is enlarged measuring 12.9 x 9.4 x 16.7 cm (estimated splenic volume of 1,013 mL). Adrenals/Urinary Tract: Several subcentimeter T1 hypointense, T2 hyperintense, nonenhancing lesions in both kidneys are compatible with tiny simple cysts (Bosniak class 1, no imaging follow-up recommended). No aggressive appearing renal lesions are noted. No hydroureteronephrosis in the visualized portions of the abdomen. Bilateral adrenal glands are normal in appearance. Stomach/Bowel: Visualized portions are unremarkable. Vascular/Lymphatic: No aneurysm identified in the visualized abdominal vasculature. Dilated portal vein (16 mm in diameter). No lymphadenopathy noted in the abdomen. Other: No significant volume of ascites noted in the visualized portions of the peritoneal cavity. Musculoskeletal: No aggressive appearing osseous lesions are noted in the visualized portions of the skeleton. IMPRESSION: 1. Study is positive for cholelithiasis and choledocholithiasis. Several obstructing stones are noted in the distal common bile duct and there is associated proximal intra and extrahepatic biliary ductal dilatation. There is also a moderately distended gallbladder with mild gallbladder wall thickening and edema and several stones which appear likely impacted in the region of the cystic duct. Findings are  concerning for early acute cholecystitis. Surgical consultation is recommended. 2. Morphologic changes in the liver suggesting early cirrhosis. 3. 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 Electronically Signed   By: Trudie Reed M.D.   On: 03/30/2023 08:58   MR 3D Recon At Scanner  Result Date: 03/30/2023 CLINICAL DATA:  82 year old female history of cholelithiasis and possible tumefactive sludge in the gallbladder lumen. EXAM: MRI ABDOMEN WITHOUT AND WITH CONTRAST (INCLUDING MRCP) TECHNIQUE: Multiplanar multisequence MR imaging of the abdomen was performed both before and after the administration of intravenous contrast. Heavily T2-weighted images of the biliary and pancreatic ducts were obtained, and three-dimensional MRCP images were rendered by post processing. CONTRAST:  6mL GADAVIST GADOBUTROL 1 MMOL/ML IV SOLN COMPARISON:  No prior abdominal MRI. Abdominal ultrasound 03/29/2023. FINDINGS: Lower chest: Unremarkable. Hepatobiliary: Liver contour is mildly irregular, suggesting a background of cirrhosis. No suspicious cystic or solid hepatic lesions. Mild intrahepatic biliary ductal dilatation. Common bile duct is also dilated measuring up to 10 mm in the porta hepatis proximally. Several filling defects are noted in the distal common bile duct, measuring up to 9 mm. Cystic duct is not well visualized, but small filling defects in the cystic duct suggest probable stones in this cystic duct. Small filling defects are also noted lying dependently in the gallbladder, indicative of cholelithiasis. Gallbladder is moderately distended. Gallbladder wall appears mildly thickened and edematous. Pancreas: No pancreatic mass. No pancreatic ductal dilatation. No pancreatic or peripancreatic fluid  collections or inflammatory changes. Spleen: Spleen is enlarged measuring 12.9 x 9.4 x 16.7 cm (estimated splenic volume of 1,013 mL). Adrenals/Urinary Tract: Several  subcentimeter T1 hypointense, T2 hyperintense, nonenhancing lesions in both kidneys are compatible with tiny simple cysts (Bosniak class 1, no imaging follow-up recommended). No aggressive appearing renal lesions are noted. No hydroureteronephrosis in the visualized portions of the abdomen. Bilateral adrenal glands are normal in appearance. Stomach/Bowel: Visualized portions are unremarkable. Vascular/Lymphatic: No aneurysm identified in the visualized abdominal vasculature. Dilated portal vein (16 mm in diameter). No lymphadenopathy noted in the abdomen. Other: No significant volume of ascites noted in the visualized portions of the peritoneal cavity. Musculoskeletal: No aggressive appearing osseous lesions are noted in the visualized portions of the skeleton. IMPRESSION: 1. Study is positive for cholelithiasis and choledocholithiasis. Several obstructing stones are noted in the distal common bile duct and there is associated proximal intra and extrahepatic biliary ductal dilatation. There is also a moderately distended gallbladder with mild gallbladder wall thickening and edema and several stones which appear likely impacted in the region of the cystic duct. Findings are concerning for early acute cholecystitis. Surgical consultation is recommended. 2. Morphologic changes in the liver suggesting early cirrhosis. 3. 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 Electronically Signed   By: Trudie Reed M.D.   On: 03/30/2023 08:58   US ABDOMEN LIMITED RUQ (LIVER/GB)  Result Date: 03/29/2023 CLINICAL DATA:  Fever and transaminitis. EXAM: ULTRASOUND ABDOMEN LIMITED RIGHT UPPER QUADRANT COMPARISON:  September 04, 2021 FINDINGS: Gallbladder: Echogenic gallstones are seen within the gallbladder lumen. The largest gallstone measures approximately 8.7 mm. A large amount of lobulated, heterogeneous echogenic material is also seen along the nondependent wall of the  gallbladder lumen no abnormal flow is seen within this region on color Doppler evaluation. The gallbladder wall measures 3.9 mm in thickness. No sonographic Murphy sign noted by sonographer. Common bile duct: Diameter: 11.2 mm Liver: Mild intrahepatic biliary dilatation is noted. No focal lesion identified. There is diffusely increased echogenicity of the liver parenchyma. Portal vein is patent on color Doppler imaging with normal direction of blood flow towards the liver. Other: Limited study secondary to inability of the patient to hold her breath. IMPRESSION: 1. Cholelithiasis with additional findings that may represent a large amount of tumefactive sludge within the gallbladder lumen. Correlation with nonemergent MRI is recommended to further exclude the presence of an underlying neoplastic process. 2. Dilated common bile duct with intrahepatic biliary dilatation. MRCP is recommended to exclude presence of an obstructing lesion. 3. Hepatic steatosis without focal liver lesions. Electronically Signed   By: Aram Candela M.D.   On: 03/29/2023 23:24   CT Head Wo Contrast  Result Date: 03/29/2023 CLINICAL DATA:  Mental status change, unknown cause EXAM: CT HEAD WITHOUT CONTRAST TECHNIQUE: Contiguous axial images were obtained from the base of the skull through the vertex without intravenous contrast. RADIATION DOSE REDUCTION: This exam was performed according to the departmental dose-optimization program which includes automated exposure control, adjustment of the mA and/or kV according to patient size and/or use of iterative reconstruction technique. COMPARISON:  03/20/2023 FINDINGS: Brain: There is atrophy and chronic small vessel disease changes. No acute intracranial abnormality. Specifically, no hemorrhage, hydrocephalus, mass lesion, acute infarction, or significant intracranial injury. Vascular: No hyperdense vessel or unexpected calcification. Skull: No acute calvarial abnormality. Sinuses/Orbits:  No acute findings Other: None IMPRESSION: Atrophy, chronic microvascular disease. No acute intracranial abnormality. Electronically Signed   By: Charlett Nose M.D.  On: 03/29/2023 20:15   DG Chest Port 1 View  Result Date: 03/29/2023 CLINICAL DATA:  Sepsis EXAM: PORTABLE CHEST 1 VIEW COMPARISON:  03/20/2023 FINDINGS: Heart and mediastinal contours within normal limits. Rounded density in the right hilum likely vascular. Lungs clear. No effusions or acute bony abnormality. IMPRESSION: No active disease. Rounded density in the right hilum felt to reflect right hilar vasculature. This could be confirmed with chest CT with IV contrast. Electronically Signed   By: Charlett Nose M.D.   On: 03/29/2023 20:14   CT Head Wo Contrast  Result Date: 03/20/2023 CLINICAL DATA:  82 year old female with moderate to severe head trauma, neck trauma, intoxicated or obtunded, facial trauma from fall EXAM: CT HEAD WITHOUT CONTRAST CT MAXILLOFACIAL WITHOUT CONTRAST CT CERVICAL SPINE WITHOUT CONTRAST TECHNIQUE: Multidetector CT imaging of the head, cervical spine, and maxillofacial structures were performed using the standard protocol without intravenous contrast. Multiplanar CT image reconstructions of the cervical spine and maxillofacial structures were also generated. RADIATION DOSE REDUCTION: This exam was performed according to the departmental dose-optimization program which includes automated exposure control, adjustment of the mA and/or kV according to patient size and/or use of iterative reconstruction technique. COMPARISON:  CT head and cervical spine 10/03/2022 FINDINGS: CT HEAD FINDINGS Brain: No intracranial hemorrhage, mass effect, or evidence of acute infarct. No hydrocephalus. No extra-axial fluid collection. Age related cerebral atrophy and chronic small vessel ischemic disease. Vascular: No hyperdense vessel. Intracranial arterial calcification. Skull: No fracture or focal lesion. Other: None. CT MAXILLOFACIAL  FINDINGS Osseous: No acute fracture. Orbits: Negative. No traumatic or inflammatory finding. Sinuses: Mild mucosal thickening in the right maxillary sinus, right sphenoid sinus, and ethmoid air cells. Paranasal sinuses otherwise well aerated. Chronic opacification of a few left mastoid air cells. Soft tissues: Right frontal and right periorbital scalp hematomas. CT CERVICAL SPINE FINDINGS Alignment: No evidence of traumatic malalignment. Skull base and vertebrae: No acute fracture. No primary bone lesion or focal pathologic process. Soft tissues and spinal canal: No prevertebral fluid or swelling. No visible canal hematoma. Disc levels: Age-related degenerative spondylosis and facet arthropathy. No severe spinal canal narrowing. Upper chest: No acute abnormality. Other: Carotid calcification. IMPRESSION: 1. No acute intracranial abnormality. 2. No acute facial fracture. Right frontal and right periorbital scalp hematomas. 3. No acute fracture in the cervical spine. Electronically Signed   By: Minerva Fester M.D.   On: 03/20/2023 21:53   CT Cervical Spine Wo Contrast  Result Date: 03/20/2023 CLINICAL DATA:  82 year old female with moderate to severe head trauma, neck trauma, intoxicated or obtunded, facial trauma from fall EXAM: CT HEAD WITHOUT CONTRAST CT MAXILLOFACIAL WITHOUT CONTRAST CT CERVICAL SPINE WITHOUT CONTRAST TECHNIQUE: Multidetector CT imaging of the head, cervical spine, and maxillofacial structures were performed using the standard protocol without intravenous contrast. Multiplanar CT image reconstructions of the cervical spine and maxillofacial structures were also generated. RADIATION DOSE REDUCTION: This exam was performed according to the departmental dose-optimization program which includes automated exposure control, adjustment of the mA and/or kV according to patient size and/or use of iterative reconstruction technique. COMPARISON:  CT head and cervical spine 10/03/2022 FINDINGS: CT HEAD  FINDINGS Brain: No intracranial hemorrhage, mass effect, or evidence of acute infarct. No hydrocephalus. No extra-axial fluid collection. Age related cerebral atrophy and chronic small vessel ischemic disease. Vascular: No hyperdense vessel. Intracranial arterial calcification. Skull: No fracture or focal lesion. Other: None. CT MAXILLOFACIAL FINDINGS Osseous: No acute fracture. Orbits: Negative. No traumatic or inflammatory finding. Sinuses: Mild mucosal thickening in the  right maxillary sinus, right sphenoid sinus, and ethmoid air cells. Paranasal sinuses otherwise well aerated. Chronic opacification of a few left mastoid air cells. Soft tissues: Right frontal and right periorbital scalp hematomas. CT CERVICAL SPINE FINDINGS Alignment: No evidence of traumatic malalignment. Skull base and vertebrae: No acute fracture. No primary bone lesion or focal pathologic process. Soft tissues and spinal canal: No prevertebral fluid or swelling. No visible canal hematoma. Disc levels: Age-related degenerative spondylosis and facet arthropathy. No severe spinal canal narrowing. Upper chest: No acute abnormality. Other: Carotid calcification. IMPRESSION: 1. No acute intracranial abnormality. 2. No acute facial fracture. Right frontal and right periorbital scalp hematomas. 3. No acute fracture in the cervical spine. Electronically Signed   By: Minerva Fester M.D.   On: 03/20/2023 21:53   CT Maxillofacial Wo Contrast  Result Date: 03/20/2023 CLINICAL DATA:  82 year old female with moderate to severe head trauma, neck trauma, intoxicated or obtunded, facial trauma from fall EXAM: CT HEAD WITHOUT CONTRAST CT MAXILLOFACIAL WITHOUT CONTRAST CT CERVICAL SPINE WITHOUT CONTRAST TECHNIQUE: Multidetector CT imaging of the head, cervical spine, and maxillofacial structures were performed using the standard protocol without intravenous contrast. Multiplanar CT image reconstructions of the cervical spine and maxillofacial structures  were also generated. RADIATION DOSE REDUCTION: This exam was performed according to the departmental dose-optimization program which includes automated exposure control, adjustment of the mA and/or kV according to patient size and/or use of iterative reconstruction technique. COMPARISON:  CT head and cervical spine 10/03/2022 FINDINGS: CT HEAD FINDINGS Brain: No intracranial hemorrhage, mass effect, or evidence of acute infarct. No hydrocephalus. No extra-axial fluid collection. Age related cerebral atrophy and chronic small vessel ischemic disease. Vascular: No hyperdense vessel. Intracranial arterial calcification. Skull: No fracture or focal lesion. Other: None. CT MAXILLOFACIAL FINDINGS Osseous: No acute fracture. Orbits: Negative. No traumatic or inflammatory finding. Sinuses: Mild mucosal thickening in the right maxillary sinus, right sphenoid sinus, and ethmoid air cells. Paranasal sinuses otherwise well aerated. Chronic opacification of a few left mastoid air cells. Soft tissues: Right frontal and right periorbital scalp hematomas. CT CERVICAL SPINE FINDINGS Alignment: No evidence of traumatic malalignment. Skull base and vertebrae: No acute fracture. No primary bone lesion or focal pathologic process. Soft tissues and spinal canal: No prevertebral fluid or swelling. No visible canal hematoma. Disc levels: Age-related degenerative spondylosis and facet arthropathy. No severe spinal canal narrowing. Upper chest: No acute abnormality. Other: Carotid calcification. IMPRESSION: 1. No acute intracranial abnormality. 2. No acute facial fracture. Right frontal and right periorbital scalp hematomas. 3. No acute fracture in the cervical spine. Electronically Signed   By: Minerva Fester M.D.   On: 03/20/2023 21:53   DG Chest Portable 1 View  Result Date: 03/20/2023 CLINICAL DATA:  Fall EXAM: PORTABLE CHEST 1 VIEW COMPARISON:  07/29/2022 FINDINGS: Mild elevation and eventration of the right hemidiaphragm. No  confluent airspace opacities or effusions. Heart and mediastinal contours within normal limits. Aortic atherosclerosis. IMPRESSION: No active cardiopulmonary disease. Electronically Signed   By: Charlett Nose M.D.   On: 03/20/2023 20:23    Microbiology: Recent Results (from the past 240 hour(s))  Resp panel by RT-PCR (RSV, Flu A&B, Covid) Anterior Nasal Swab     Status: None   Collection Time: 03/29/23  5:45 PM   Specimen: Anterior Nasal Swab  Result Value Ref Range Status   SARS Coronavirus 2 by RT PCR NEGATIVE NEGATIVE Final    Comment: (NOTE) SARS-CoV-2 target nucleic acids are NOT DETECTED.  The SARS-CoV-2 RNA is generally detectable  in upper respiratory specimens during the acute phase of infection. The lowest concentration of SARS-CoV-2 viral copies this assay can detect is 138 copies/mL. A negative result does not preclude SARS-Cov-2 infection and should not be used as the sole basis for treatment or other patient management decisions. A negative result may occur with  improper specimen collection/handling, submission of specimen other than nasopharyngeal swab, presence of viral mutation(s) within the areas targeted by this assay, and inadequate number of viral copies(<138 copies/mL). A negative result must be combined with clinical observations, patient history, and epidemiological information. The expected result is Negative.  Fact Sheet for Patients:  BloggerCourse.com  Fact Sheet for Healthcare Providers:  SeriousBroker.it  This test is no t yet approved or cleared by the [place] FDA and  has been authorized for detection and/or diagnosis of SARS-CoV-2 by FDA under an Emergency Use Authorization (EUA). This EUA will remain  in effect (meaning this test can be used) for the duration of the COVID-19 declaration under Section 564(b)(1) of the Act, 21 U.S.C.section 360bbb-3(b)(1), unless the authorization is terminated   or revoked sooner.       Influenza A by PCR NEGATIVE NEGATIVE Final   Influenza B by PCR NEGATIVE NEGATIVE Final    Comment: (NOTE) The Xpert Xpress SARS-CoV-2/FLU/RSV plus assay is intended as an aid in the diagnosis of influenza from Nasopharyngeal swab specimens and should not be used as a sole basis for treatment. Nasal washings and aspirates are unacceptable for Xpert Xpress SARS-CoV-2/FLU/RSV testing.  Fact Sheet for Patients: BloggerCourse.com  Fact Sheet for Healthcare Providers: SeriousBroker.it  This test is not yet approved or cleared by the [place] FDA and has been authorized for detection and/or diagnosis of SARS-CoV-2 by FDA under an Emergency Use Authorization (EUA). This EUA will remain in effect (meaning this test can be used) for the duration of the COVID-19 declaration under Section 564(b)(1) of the Act, 21 U.S.C. section 360bbb-3(b)(1), unless the authorization is terminated or revoked.     Resp Syncytial Virus by PCR NEGATIVE NEGATIVE Final    Comment: (NOTE) Fact Sheet for Patients: BloggerCourse.com  Fact Sheet for Healthcare Providers: SeriousBroker.it  This test is not yet approved or cleared by the [place] FDA and has been authorized for detection and/or diagnosis of SARS-CoV-2 by FDA under an Emergency Use Authorization (EUA). This EUA will remain in effect (meaning this test can be used) for the duration of the COVID-19 declaration under Section 564(b)(1) of the Act, 21 U.S.C. section 360bbb-3(b)(1), unless the authorization is terminated or revoked.  Performed at [facility], [address] [place], [place] [address]   Culture, blood (Routine x 2)     Status: Abnormal   Collection Time: 03/29/23  5:46 PM   Specimen: BLOOD  Result Value Ref Range Status   Specimen Description   Final    BLOOD RIGHT  ANTECUBITAL Performed at [facility], [address], [place], [place] [address]    Special Requests   Final    BOTTLES DRAWN AEROBIC AND ANAEROBIC Blood Culture adequate volume Performed at [facility], [address] [place], [place] [address]    Culture  Setup Time   Final    GRAM NEGATIVE RODS IN BOTH AEROBIC AND ANAEROBIC BOTTLES CRITICAL RESULT CALLED TO, READ BACK BY AND VERIFIED WITH: JASON ROBBINS AT 0630 03/30/23.PMF    Culture ESCHERICHIA COLI (A)  Final   Report Status 04/01/2023 FINAL  Final   Organism ID, Bacteria ESCHERICHIA COLI  Final      Susceptibility   Escherichia coli - MIC*    AMPICILLIN 4 SENSITIVE Sensitive     CEFEPIME <=0.12 SENSITIVE Sensitive     CEFTAZIDIME <=1 SENSITIVE Sensitive     CEFTRIAXONE <=0.25 SENSITIVE Sensitive     CIPROFLOXACIN <=0.25 SENSITIVE Sensitive     GENTAMICIN <=1 SENSITIVE Sensitive     IMIPENEM <=0.25 SENSITIVE Sensitive     TRIMETH/SULFA <=20 SENSITIVE Sensitive     AMPICILLIN/SULBACTAM <=2 SENSITIVE Sensitive     PIP/TAZO <=4 SENSITIVE Sensitive ug/mL    * ESCHERICHIA COLI  Blood Culture ID Panel (Reflexed)     Status: Abnormal   Collection Time: 03/29/23  5:46 PM  Result Value Ref Range Status   Enterococcus faecalis NOT DETECTED NOT DETECTED Final   Enterococcus Faecium NOT DETECTED NOT DETECTED Final   Listeria monocytogenes NOT DETECTED NOT DETECTED Final   Staphylococcus species NOT DETECTED NOT DETECTED Final   Staphylococcus aureus (BCID) NOT DETECTED NOT DETECTED Final   Staphylococcus epidermidis NOT DETECTED NOT DETECTED Final   Staphylococcus lugdunensis NOT DETECTED NOT DETECTED Final   Streptococcus species NOT DETECTED NOT DETECTED Final   Streptococcus agalactiae NOT DETECTED NOT DETECTED Final   Streptococcus pneumoniae NOT DETECTED NOT DETECTED Final   Streptococcus pyogenes NOT DETECTED NOT DETECTED Final   A.calcoaceticus-baumannii NOT DETECTED NOT DETECTED Final   Bacteroides  fragilis NOT DETECTED NOT DETECTED Final   Enterobacterales DETECTED (A) NOT DETECTED Final    Comment: Enterobacterales represent a large order of gram negative bacteria, not a single organism. CRITICAL RESULT CALLED TO, READ BACK BY AND VERIFIED WITH: JASON ROBBINS AT 0630 03/30/23.PMF    Enterobacter cloacae complex NOT DETECTED NOT DETECTED Final   Escherichia coli DETECTED (A) NOT DETECTED Final    Comment: CRITICAL RESULT CALLED TO, READ BACK BY AND VERIFIED WITH: JASON ROBBINS AT 0630 03/30/23.PMF    Klebsiella aerogenes NOT DETECTED NOT DETECTED Final   Klebsiella oxytoca NOT DETECTED NOT DETECTED Final   Klebsiella pneumoniae NOT DETECTED NOT DETECTED Final   Proteus species NOT DETECTED NOT DETECTED Final   Salmonella species NOT DETECTED NOT DETECTED Final   Serratia marcescens NOT DETECTED NOT DETECTED Final   Haemophilus influenzae NOT DETECTED NOT DETECTED Final   Neisseria meningitidis NOT DETECTED NOT DETECTED Final   Pseudomonas aeruginosa NOT DETECTED NOT DETECTED Final   Stenotrophomonas maltophilia NOT DETECTED NOT DETECTED Final   Candida albicans NOT DETECTED NOT DETECTED Final   Candida auris NOT DETECTED NOT DETECTED Final   Candida glabrata NOT DETECTED NOT DETECTED Final   Candida krusei NOT DETECTED NOT DETECTED Final   Candida parapsilosis NOT DETECTED NOT DETECTED Final   Candida tropicalis NOT DETECTED NOT DETECTED Final   Cryptococcus neoformans/gattii NOT DETECTED NOT DETECTED Final   CTX-M ESBL NOT DETECTED NOT DETECTED Final   Carbapenem resistance IMP NOT DETECTED NOT DETECTED Final   Carbapenem resistance KPC NOT DETECTED NOT DETECTED Final   Carbapenem resistance NDM NOT DETECTED NOT DETECTED Final   Carbapenem resist OXA 48 LIKE NOT DETECTED NOT DETECTED Final   Carbapenem resistance VIM NOT DETECTED NOT DETECTED Final    Comment: Performed at [facility], [address] [place], [place] [address]  Culture, blood (Routine x 2)      Status: Abnormal   Collection Time: 03/29/23  6:45 PM   Specimen: BLOOD  Result Value Ref Range Status   Specimen Description   Final    BLOOD BLOOD LEFT ARM Performed at  [facility], [address] [place], [place] [address]    Special Requests   Final    BOTTLES DRAWN AEROBIC AND ANAEROBIC Blood Culture adequate volume Performed at [facility], [address] [place], [place] [address]    Culture  Setup Time   Final    GRAM NEGATIVE RODS IN BOTH AEROBIC AND ANAEROBIC BOTTLES CRITICAL RESULT CALLED TO, READ BACK BY AND VERIFIED WITH: JASON ROBBINS AT 0630 03/30/23.PMF    Culture (A)  Final    ESCHERICHIA COLI SUSCEPTIBILITIES PERFORMED ON PREVIOUS CULTURE WITHIN THE LAST 5 DAYS. Performed at [facility], [address] [place], [place] [address]    Report Status 04/01/2023 FINAL  Final  Urine Culture     Status: Abnormal   Collection Time: 03/29/23  6:45 PM   Specimen: Urine, Random  Result Value Ref Range Status   Specimen Description   Final    URINE, RANDOM Performed at [facility], [address] [place], [place] [address]    Special Requests   Final    NONE Reflexed from [phone] Performed at [facility], [address] [place], [place] [address]    Culture (A)  Final    >=100,000 COLONIES/mL ENTEROCOCCUS FAECIUM VANCOMYCIN RESISTANT ENTEROCOCCUS ISOLATED    Report Status 04/01/2023 FINAL  Final   Organism ID, Bacteria ENTEROCOCCUS FAECIUM (A)  Final      Susceptibility   Enterococcus faecium - MIC*    AMPICILLIN >=32 RESISTANT Resistant     NITROFURANTOIN 32 SENSITIVE Sensitive     VANCOMYCIN >=32 RESISTANT Resistant     GENTAMICIN SYNERGY SENSITIVE Sensitive     LINEZOLID 2 SENSITIVE Sensitive     * >=100,000 COLONIES/mL ENTEROCOCCUS FAECIUM  Culture, blood (Routine X 2) w Reflex to ID Panel     Status: None (Preliminary result)   Collection Time: 03/30/23 10:49 PM   Specimen: BLOOD  Result Value Ref  Range Status   Specimen Description BLOOD SITE NOT SPECIFIED  Final   Special Requests AEROBIC BOTTLE ONLY Blood Culture adequate volume  Final   Culture   Final    NO GROWTH 4 DAYS Performed at [facility], [address], [place], [place] [address]    Report Status PENDING  Incomplete  Culture, blood (Routine X 2) w Reflex to ID Panel     Status: None (Preliminary result)   Collection Time: 03/30/23 10:49 PM   Specimen: BLOOD  Result Value Ref Range Status   Specimen Description BLOOD BLOOD RIGHT HAND  Final   Special Requests AEROBIC BOTTLE ONLY Blood Culture adequate volume  Final   Culture   Final    NO GROWTH 4 DAYS Performed at [facility], [address] [place], [place] [address]    Report Status PENDING  Incomplete     Labs: Basic Metabolic Panel: Recent Labs  Lab 03/30/23 1618 03/31/23 0458 04/01/23 0655 04/02/23 0507 04/03/23 0508  NA 140 139 136 139 139  K 3.4* 3.6 3.5 2.9* 3.9  CL 100 101 104 103 101  CO2 30 26 24 26 30   GLUCOSE 83 63* 178* 96 130*  BUN 13 12 14 8  6*  CREATININE 0.92 0.83 0.84 0.82 0.78  CALCIUM 8.8* 8.4* 8.1* 8.5* 8.9   Liver Function Tests: Recent Labs  Lab 03/30/23 1618 03/31/23 0458 04/01/23 0655 04/02/23 0507 04/03/23 0508  AST 138* 89* 40 34 27  ALT 109* 87* 60* 56* 43  ALKPHOS 747*  595* 543* 620* 554*  BILITOT 1.6* 1.4* 0.7 1.2 0.9  PROT 5.1* 5.1* 4.8* 5.5* 5.0*  ALBUMIN 2.7* 2.5* 2.4* 2.8* 2.6*   No results for input(s): "LIPASE", "AMYLASE" in the last 168 hours. No results for input(s): "AMMONIA" in the last 168 hours. CBC: Recent Labs  Lab 03/29/23 1746 03/30/23 1618 03/31/23 0458 04/01/23 0655 04/02/23 0507  WBC 7.5 4.8 4.6 5.0 4.1  NEUTROABS 6.5  --   --  3.5 2.5  HGB 10.2* 8.8* 8.7* 8.4* 10.2*  HCT 32.0* 27.5* 27.5* 25.9* 31.3*  MCV 89.1 86.8 87.0 89.6 88.2  PLT 201 175 166 168 191   Cardiac Enzymes: No results for input(s): "CKTOTAL", "CKMB", "CKMBINDEX", "TROPONINI" in the last 168  hours. BNP: BNP (last 3 results) No results for input(s): "BNP" in the last 8760 hours.  ProBNP (last 3 results) No results for input(s): "PROBNP" in the last 8760 hours.  CBG: No results for input(s): "GLUCAP" in the last 168 hours.     Signed:  Silvano Bilis MD.  [facility] 04/03/2023, 10:44 AM

## 2023-04-04 LAB — CULTURE, BLOOD (ROUTINE X 2)
Culture: NO GROWTH
Culture: NO GROWTH
Special Requests: ADEQUATE
Special Requests: ADEQUATE

## 2023-05-30 ENCOUNTER — Encounter (HOSPITAL_COMMUNITY): Payer: Self-pay | Admitting: Gastroenterology

## 2023-10-16 IMAGING — CR DG HIP (WITH OR WITHOUT PELVIS) 2-3V*L*
1 series · 3 of 3 positions shown · non-contrast
Comparison: None.

CLINICAL DATA: Witnessed fell, tripped

EXAM:
DG HIP (WITH OR WITHOUT PELVIS) 2-3V LEFT; LEFT FEMUR 2 VIEWS

[Series 1: dg hip unilat w or w/o pelvis 2-3 views  · non-contrast · 0.14mm/px · 3 of 3 slices shown]
[im 1/3]
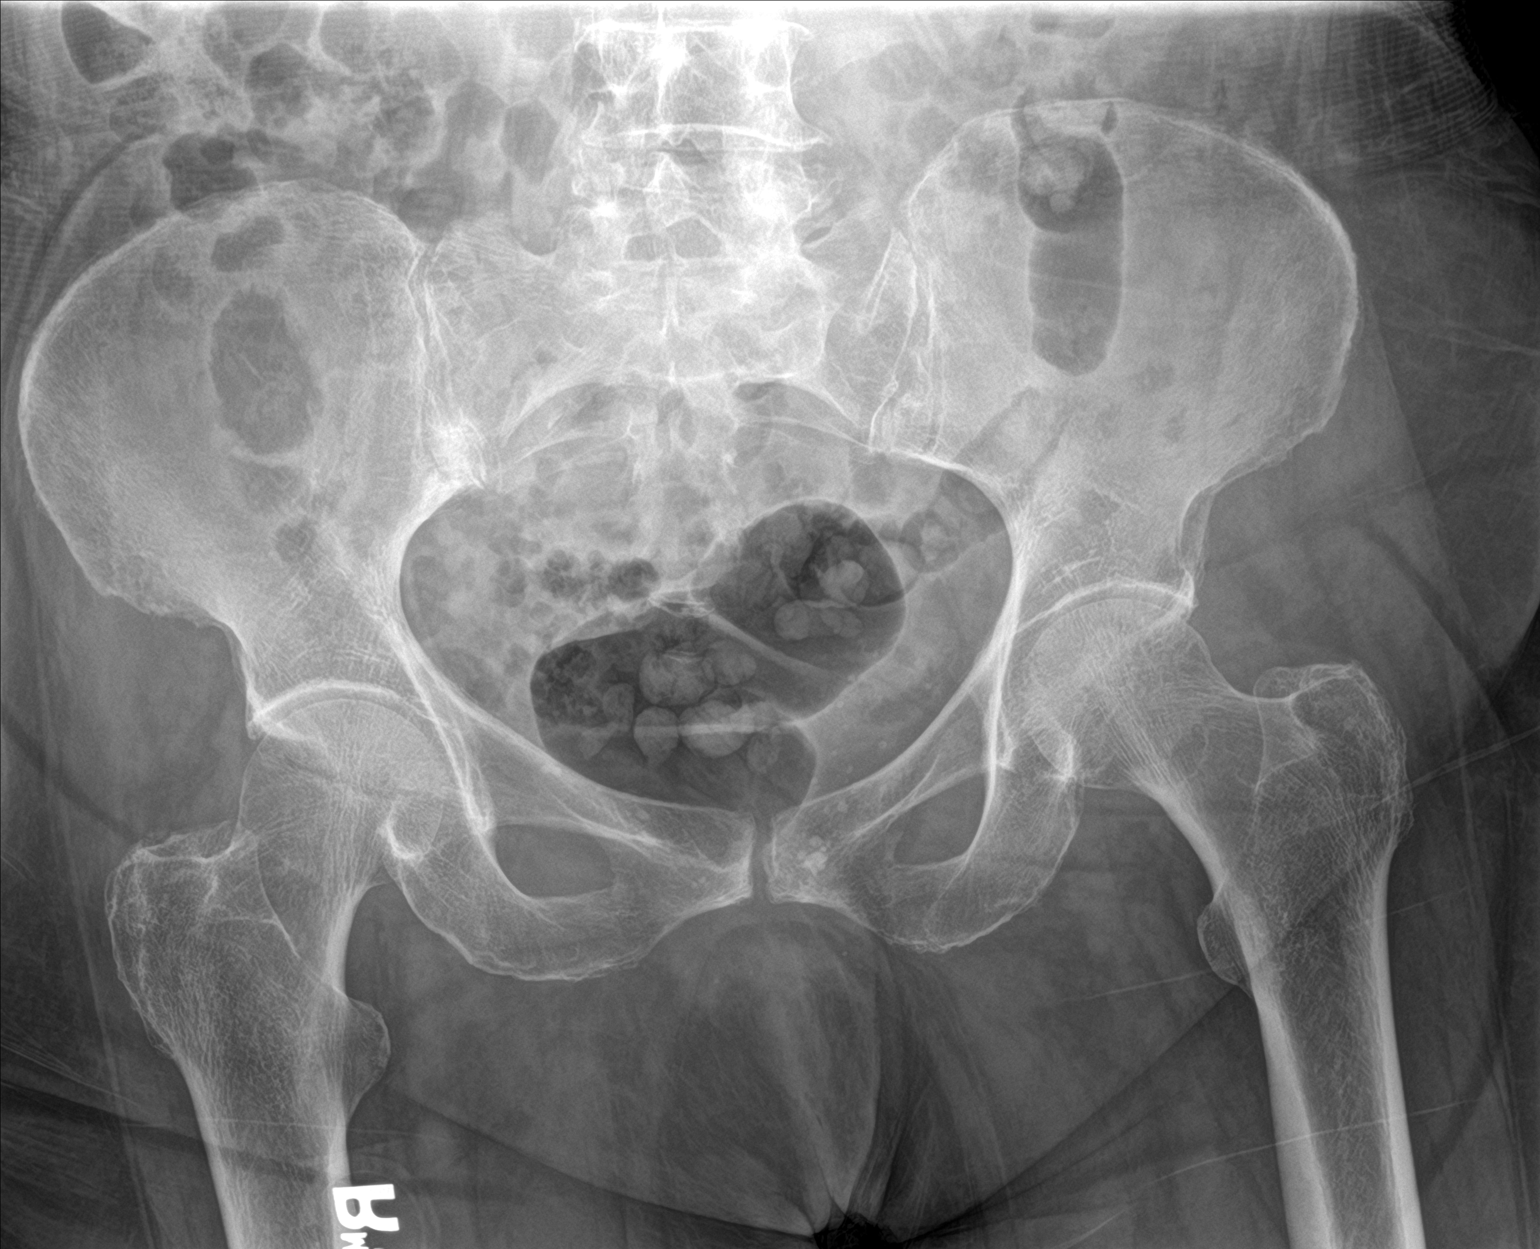
[im 2/3]
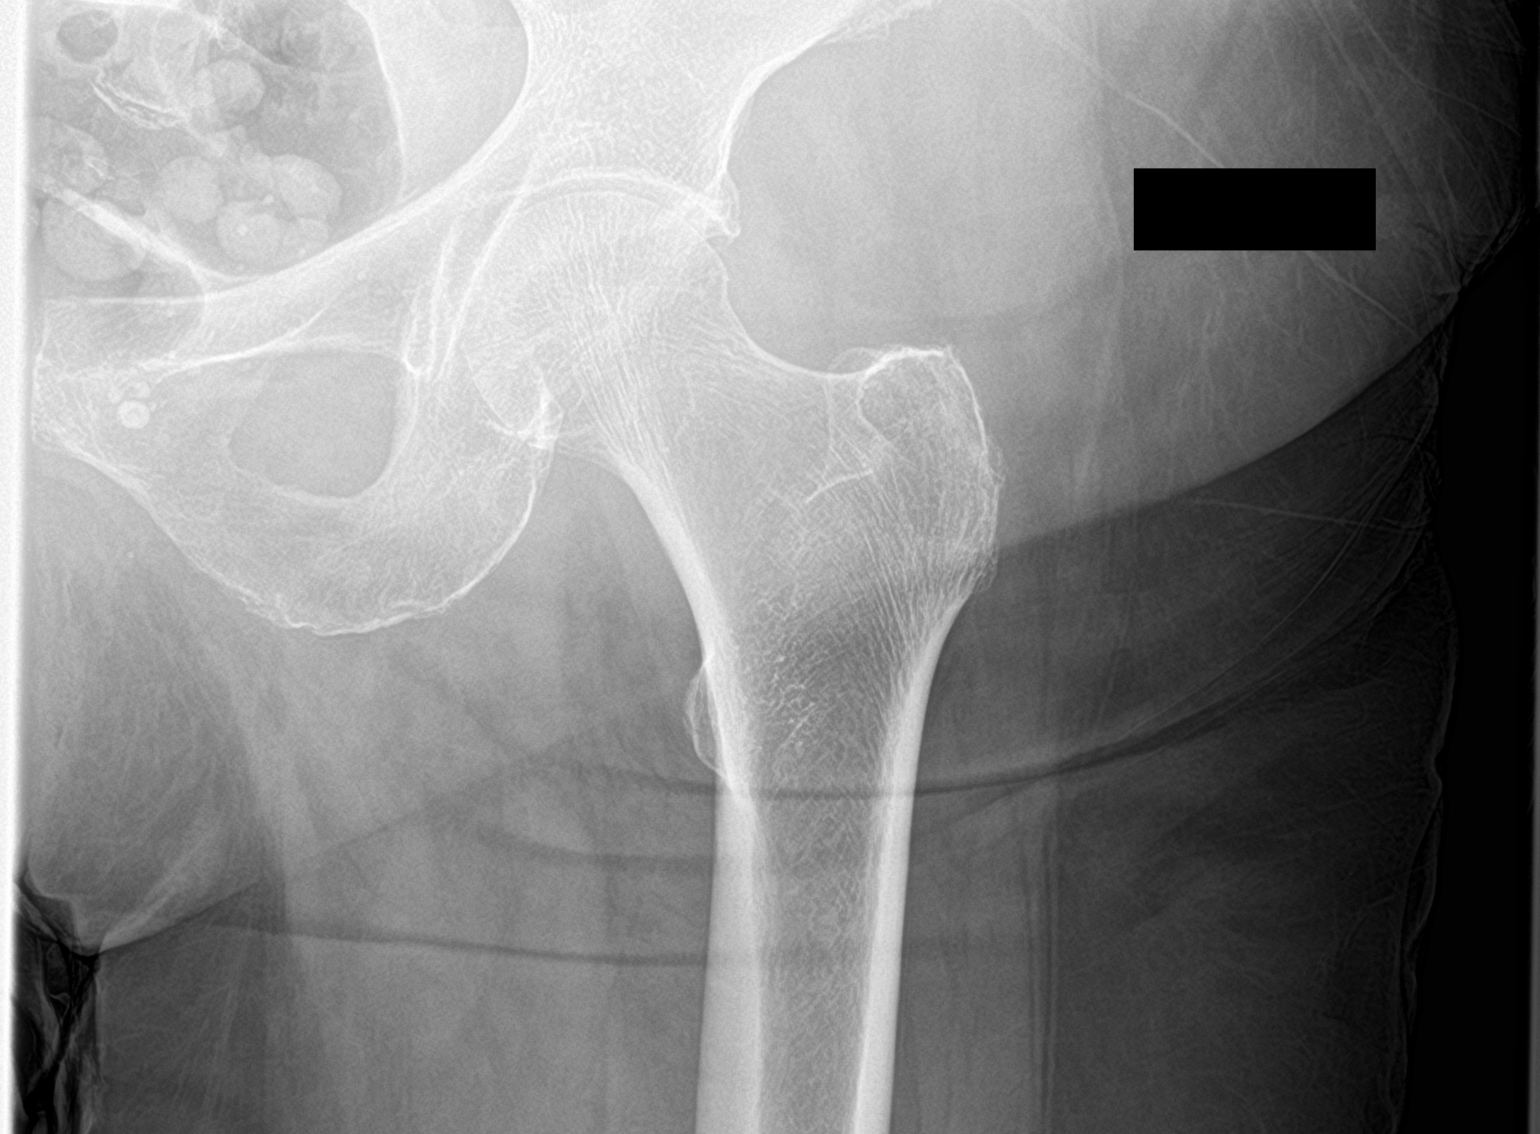
[im 3/3]
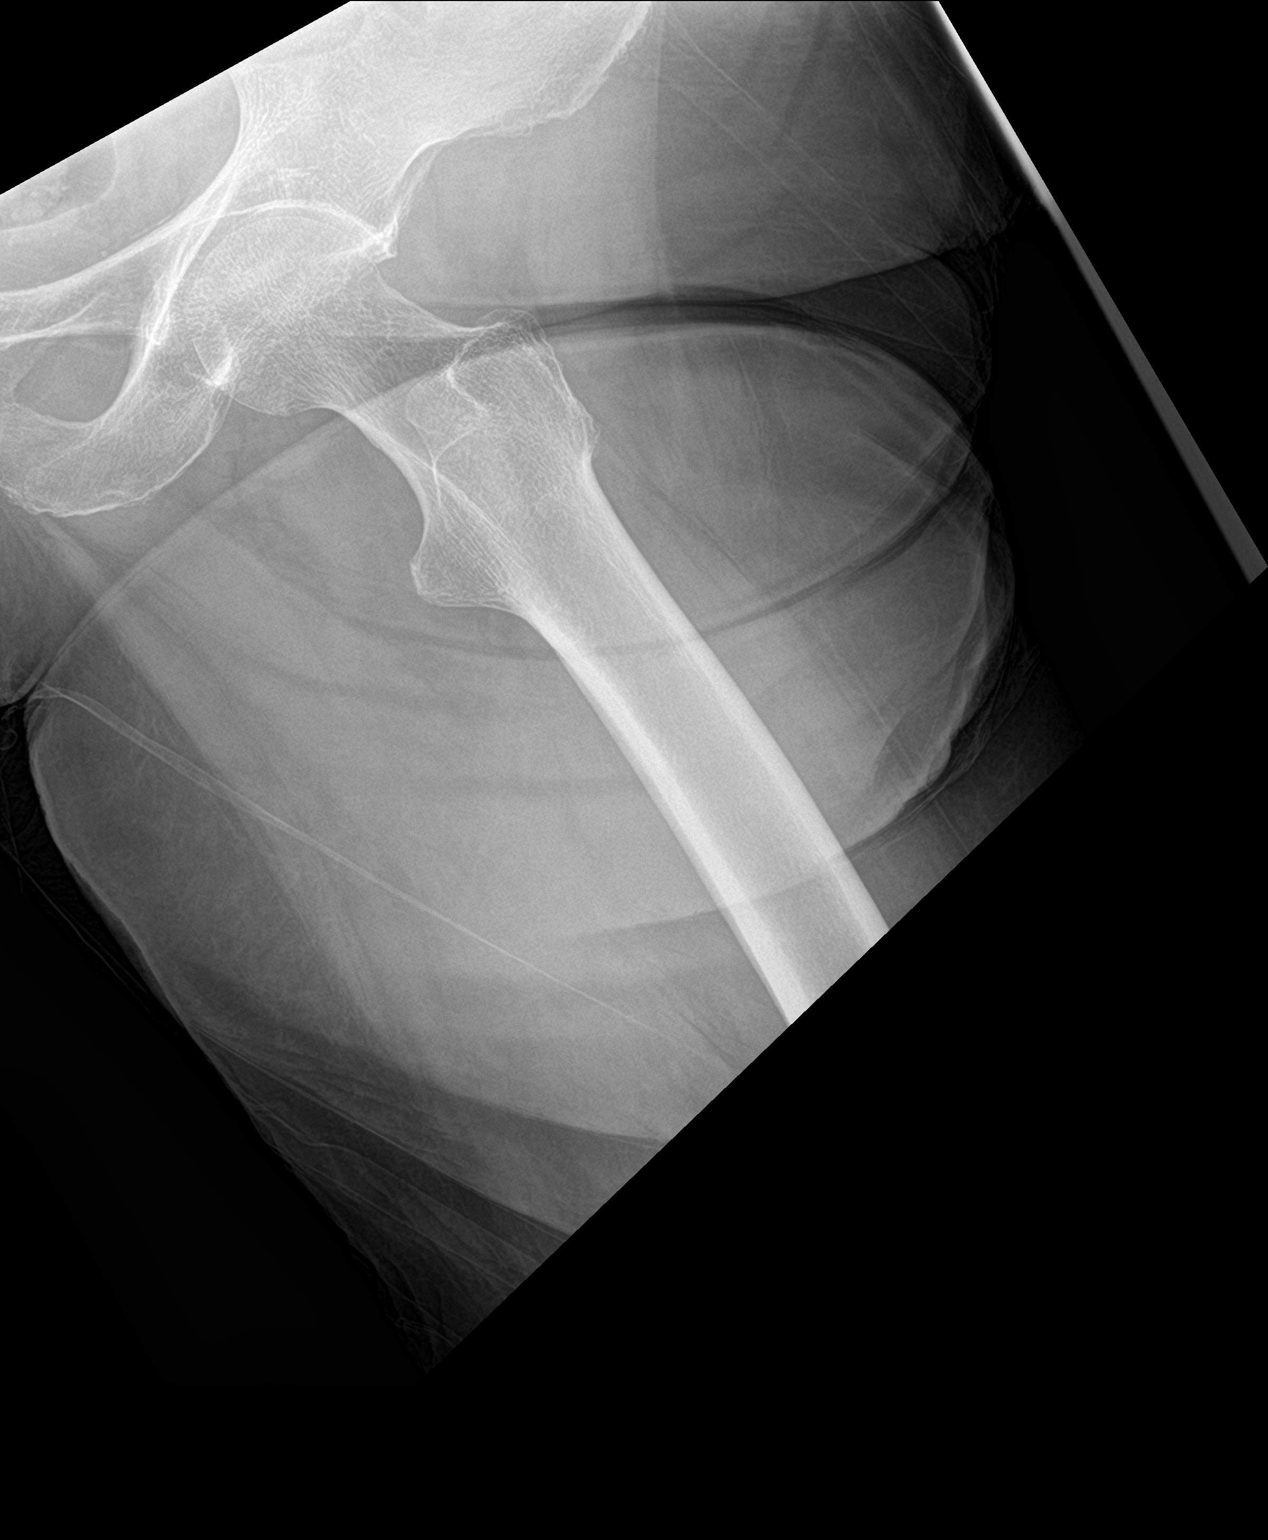

[3 of 3 positions shown; findings below may reference images not displayed]

FINDINGS: Left hip: Frontal view of the pelvis as well as frontal and frogleg
lateral views of the left hip are obtained. No acute fracture,
subluxation, or dislocation. Mild symmetrical bilateral hip
osteoarthritis. The soft tissues are unremarkable.

Left femur: Frontal and lateral views are obtained. There are no
acute displaced fractures. Alignment is anatomic. Mild
osteoarthritis of the left hip and knee. Soft tissues are normal.
IMPRESSION: 1. No acute fractures.
2. Mild osteoarthritis of the bilateral hips and left knee.

## 2024-01-31 IMAGING — CT CT ABD-PELV W/ CM
2 of 5 series · 16 of 46 positions shown, 18 images · IV contrast (agent unspecified)
Comparison: 09/04/2021, 02/23/2014

CLINICAL DATA: Mid abdominal pain for several months

EXAM:
CT ABDOMEN AND PELVIS WITH CONTRAST
TECHNIQUE: Multidetector CT imaging of the abdomen and pelvis was performed
using the standard protocol following bolus administration of
intravenous contrast.

[Series 2: abd pelvis 5.00 · axial · 0.83mm/px · z∈[-1558,-1168]mm · 13 of 88 slices shown, 15 images]
[im 5/88  soft-tissue]
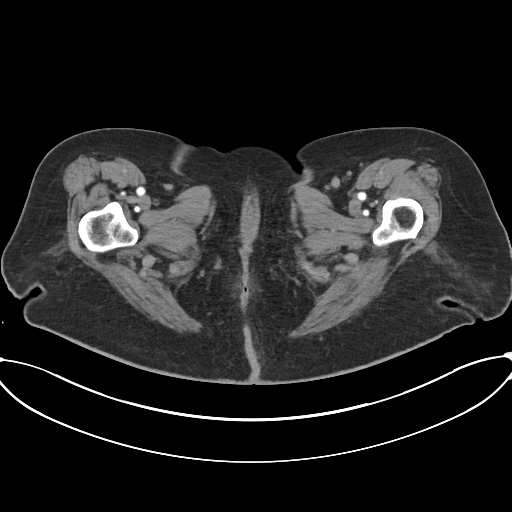
[im 5/88  bone]
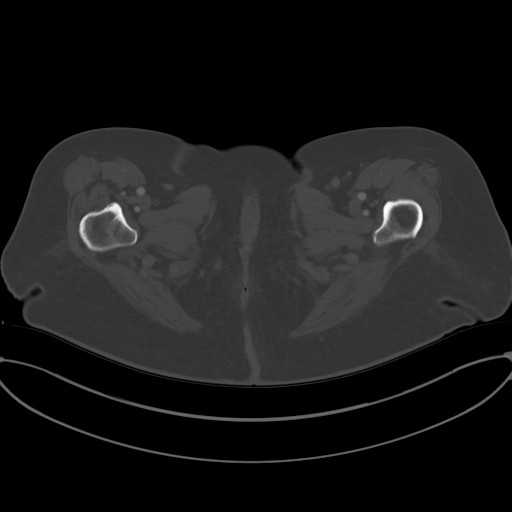
[im 10/88  soft-tissue]
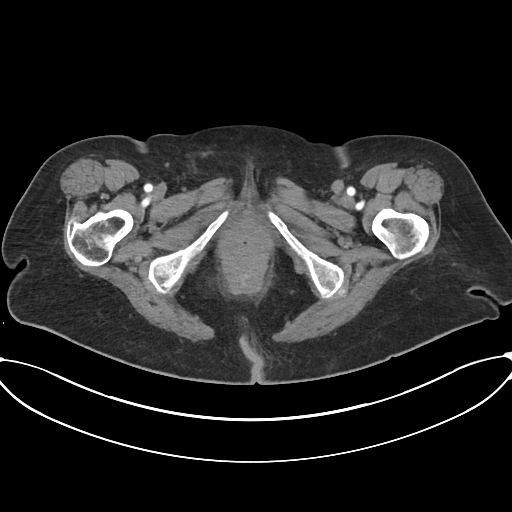
[im 20/88  soft-tissue]
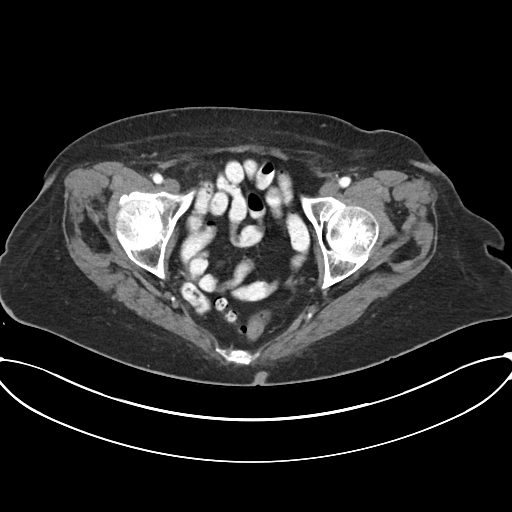
[im 25/88  soft-tissue]
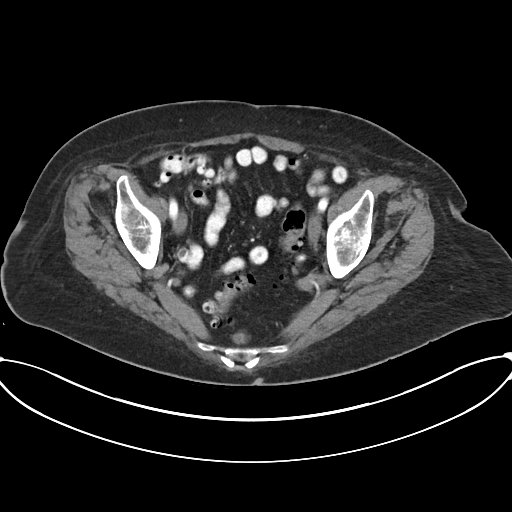
[im 30/88  soft-tissue]
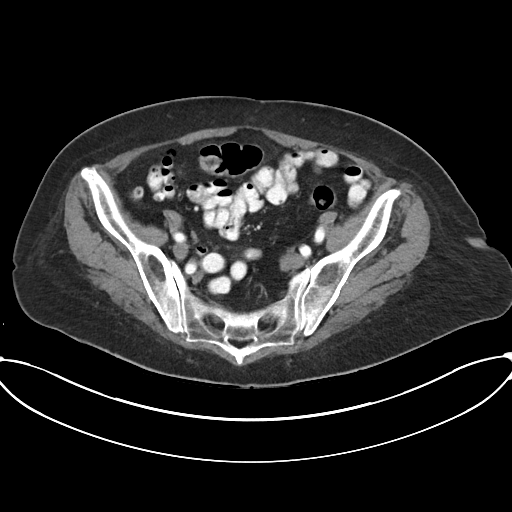
[im 39/88  soft-tissue]
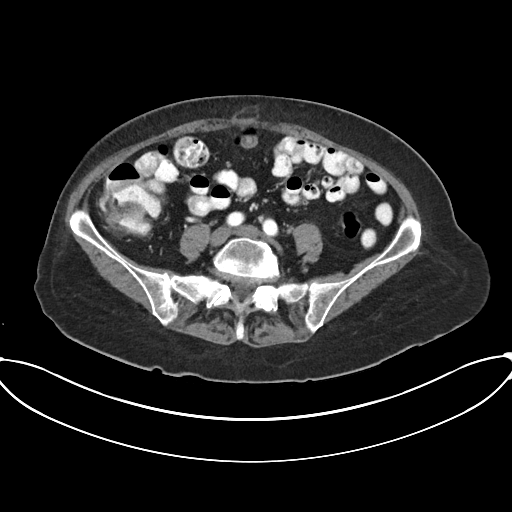
[im 44/88  soft-tissue]
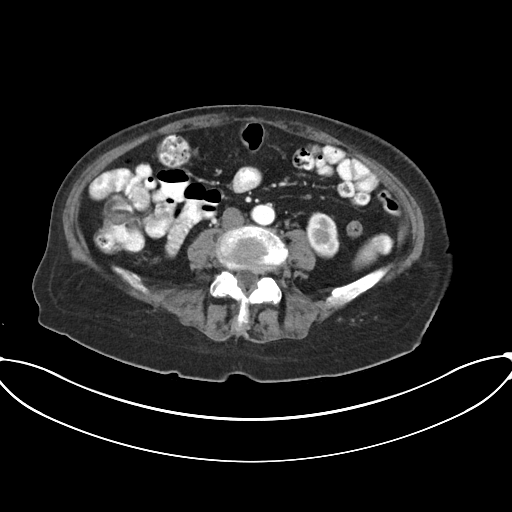
[im 49/88  soft-tissue]
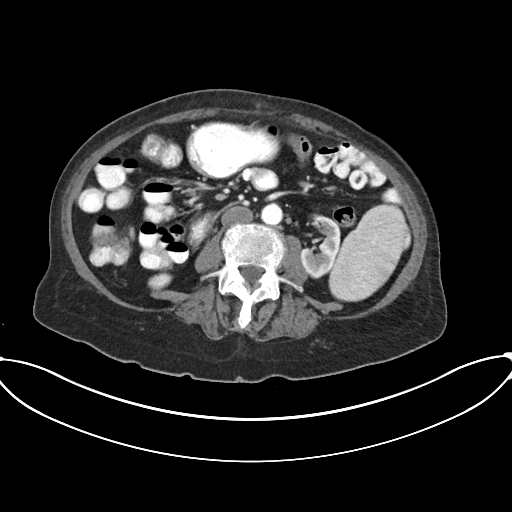
[im 59/88  soft-tissue]
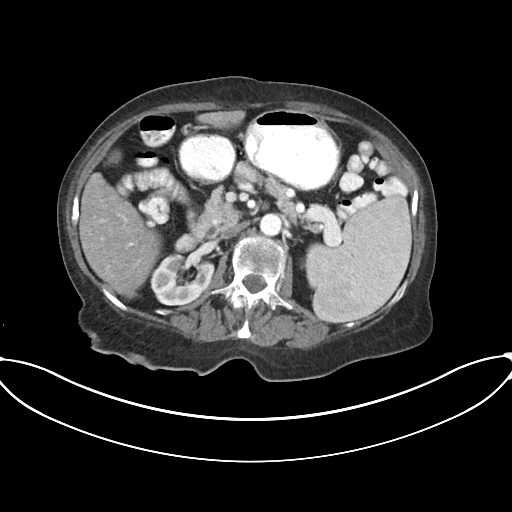
[im 59/88  bone]
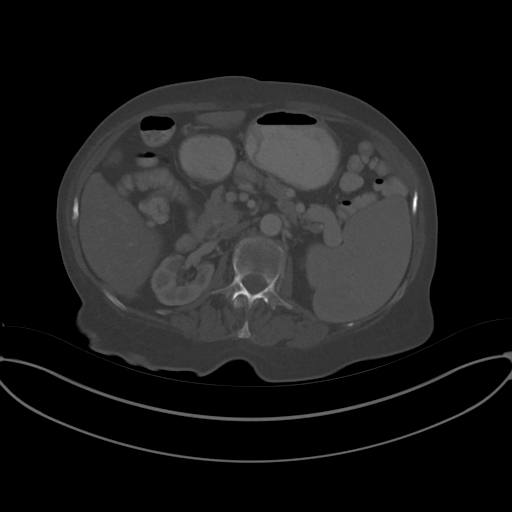
[im 63/88  soft-tissue]
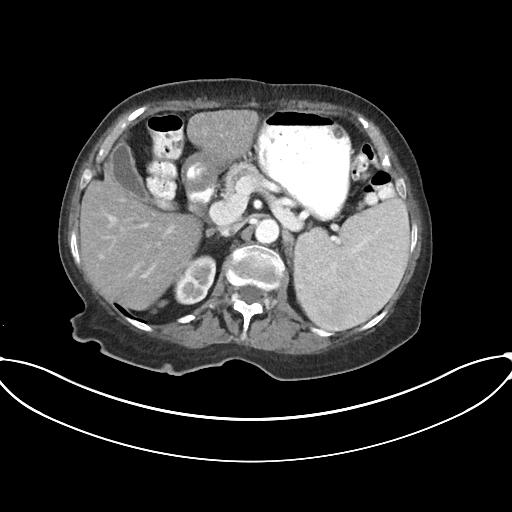
[im 68/88  soft-tissue]
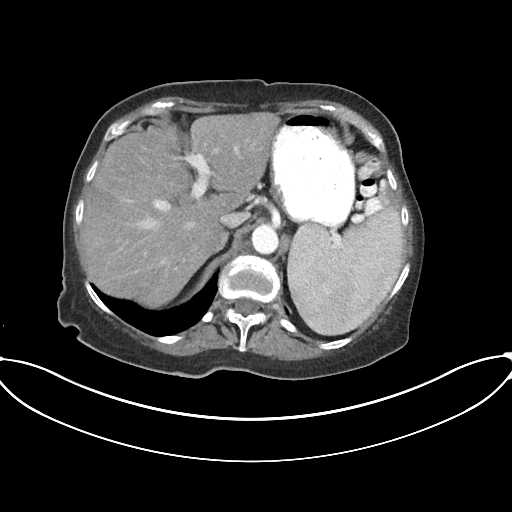
[im 78/88  soft-tissue]
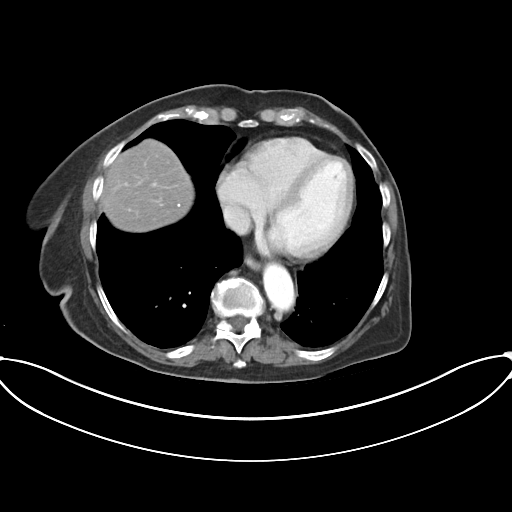
[im 83/88  soft-tissue]
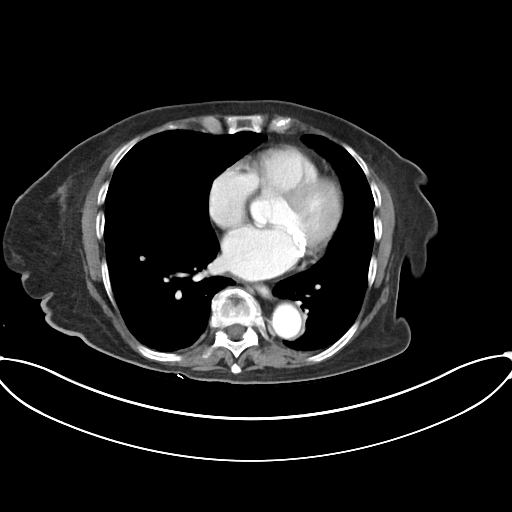

[Series 4: coronals abd pelvis 2.00 cor · coronal · 0.83mm/px · 3 of 129 slices shown]
[im 43/129  soft-tissue]
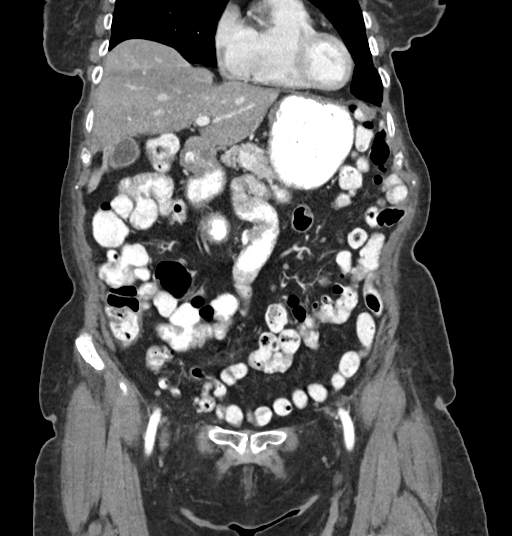
[im 57/129  soft-tissue]
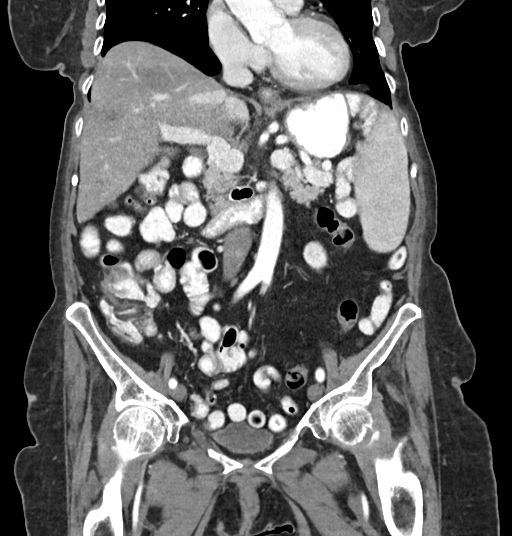
[im 72/129  soft-tissue]
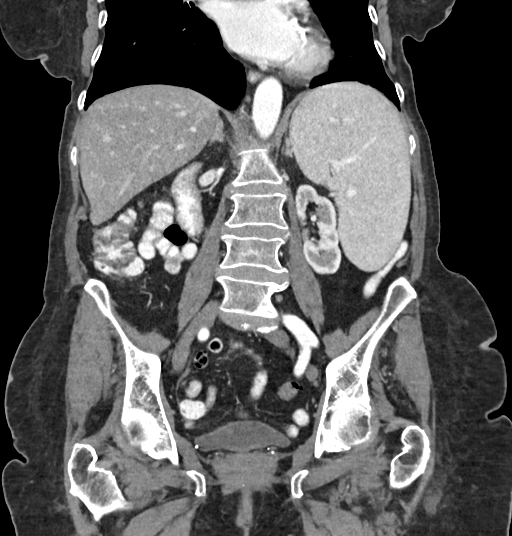

[16 of 46 positions shown; findings below may reference images not displayed]

RADIATION DOSE REDUCTION: This exam was performed according to the
departmental dose-optimization program which includes automated
exposure control, adjustment of the mA and/or kV according to
patient size and/or use of iterative reconstruction technique.

CONTRAST:  80mL OMNIPAQUE IOHEXOL 300 MG/ML  SOLN
FINDINGS: Lower chest: No acute pleural or parenchymal lung disease.
Calcification of the mitral annulus with dilatation of the left
atrium.

Hepatobiliary: Diffuse hepatic steatosis. No focal liver
abnormality. The gallbladder is unremarkable.

Pancreas: Unremarkable. No pancreatic ductal dilatation or
surrounding inflammatory changes.

Spleen: The spleen is enlarged, measuring 12.1 x 9.1 x 14.9 cm. No
focal abnormality.

Adrenals/Urinary Tract: Mild bilateral renal cortical thinning.
Kidneys enhance normally and symmetrically. No urinary tract calculi
or obstructive uropathy. The adrenals are stable. Bladder is
minimally distended, with no gross abnormality.

Stomach/Bowel: No bowel obstruction or ileus. Normal appendix right
lower quadrant. Diverticulosis of the sigmoid colon without
diverticulitis. No bowel wall thickening or inflammatory change.

Vascular/Lymphatic: No significant vascular findings are present. No
enlarged abdominal or pelvic lymph nodes.

Reproductive: Status post hysterectomy. No adnexal masses.

Other: No free fluid or free intraperitoneal gas. Small fat
containing right upper quadrant ventral hernia, small fat containing
umbilical hernia, and fat containing bilateral inguinal hernias are
unchanged since prior exam. No bowel herniation.

Musculoskeletal: Subacute to chronic compression deformity within
the superior endplate of the L1 vertebral body, with less than 50%
loss of height. No retropulsion. No other acute bony abnormalities.
Reconstructed images demonstrate no additional findings.
IMPRESSION: 1. Splenomegaly.
2. Subacute to chronic compression deformity superior endplate L1
vertebral body, with less than 50% loss of height and no
retropulsion.
3. Hepatic steatosis.
4. Distal colonic diverticulosis without diverticulitis.
5. Fat containing ventral, umbilical, and inguinal hernias as above.
No bowel herniation.
6. Mitral annular calcification with left atrial dilatation.
